# Patient Record
Sex: Female | Born: 1935 | Race: White | Hispanic: No | Marital: Married | Smoking: Former smoker
Health system: Southern US, Community
[De-identification: ages and names within clinical notes are randomized; demographics above are authoritative.]

## PROBLEM LIST (undated history)

## (undated) DIAGNOSIS — F319 Bipolar disorder, unspecified: Secondary | ICD-10-CM

## (undated) DIAGNOSIS — E7800 Pure hypercholesterolemia, unspecified: Secondary | ICD-10-CM

## (undated) DIAGNOSIS — D649 Anemia, unspecified: Secondary | ICD-10-CM

## (undated) DIAGNOSIS — M1990 Unspecified osteoarthritis, unspecified site: Secondary | ICD-10-CM

## (undated) DIAGNOSIS — R945 Abnormal results of liver function studies: Secondary | ICD-10-CM

## (undated) DIAGNOSIS — Z8673 Personal history of transient ischemic attack (TIA), and cerebral infarction without residual deficits: Secondary | ICD-10-CM

## (undated) DIAGNOSIS — K219 Gastro-esophageal reflux disease without esophagitis: Secondary | ICD-10-CM

## (undated) DIAGNOSIS — K5900 Constipation, unspecified: Secondary | ICD-10-CM

## (undated) DIAGNOSIS — E079 Disorder of thyroid, unspecified: Secondary | ICD-10-CM

## (undated) DIAGNOSIS — N189 Chronic kidney disease, unspecified: Secondary | ICD-10-CM

## (undated) DIAGNOSIS — H3530 Unspecified macular degeneration: Secondary | ICD-10-CM

## (undated) DIAGNOSIS — G5710 Meralgia paresthetica, unspecified lower limb: Secondary | ICD-10-CM

## (undated) DIAGNOSIS — T8859XA Other complications of anesthesia, initial encounter: Secondary | ICD-10-CM

## (undated) DIAGNOSIS — R569 Unspecified convulsions: Secondary | ICD-10-CM

## (undated) DIAGNOSIS — I10 Essential (primary) hypertension: Secondary | ICD-10-CM

## (undated) DIAGNOSIS — B029 Zoster without complications: Secondary | ICD-10-CM

## (undated) DIAGNOSIS — G5600 Carpal tunnel syndrome, unspecified upper limb: Secondary | ICD-10-CM

## (undated) DIAGNOSIS — N390 Urinary tract infection, site not specified: Secondary | ICD-10-CM

## (undated) DIAGNOSIS — T4145XA Adverse effect of unspecified anesthetic, initial encounter: Secondary | ICD-10-CM

## (undated) DIAGNOSIS — J8410 Pulmonary fibrosis, unspecified: Secondary | ICD-10-CM

## (undated) DIAGNOSIS — K5790 Diverticulosis of intestine, part unspecified, without perforation or abscess without bleeding: Secondary | ICD-10-CM

## (undated) DIAGNOSIS — E119 Type 2 diabetes mellitus without complications: Secondary | ICD-10-CM

## (undated) DIAGNOSIS — D631 Anemia in chronic kidney disease: Secondary | ICD-10-CM

## (undated) DIAGNOSIS — R7989 Other specified abnormal findings of blood chemistry: Secondary | ICD-10-CM

## (undated) DIAGNOSIS — N184 Chronic kidney disease, stage 4 (severe): Secondary | ICD-10-CM

## (undated) DIAGNOSIS — I509 Heart failure, unspecified: Secondary | ICD-10-CM

## (undated) HISTORY — PX: BACK SURGERY: SHX140

## (undated) HISTORY — DX: Type 2 diabetes mellitus without complications: E11.9

## (undated) HISTORY — PX: EYE SURGERY: SHX253

## (undated) HISTORY — PX: COLONOSCOPY: SHX174

## (undated) HISTORY — PX: TONSILLECTOMY: SUR1361

## (undated) HISTORY — PX: BREAST BIOPSY: SHX20

## (undated) HISTORY — PX: CATARACT EXTRACTION, BILATERAL: SHX1313

## (undated) HISTORY — PX: CARPAL TUNNEL RELEASE: SHX101

---

## 1999-04-30 ENCOUNTER — Encounter: Admission: RE | Admit: 1999-04-30 | Discharge: 1999-07-29 | Payer: Self-pay | Admitting: *Deleted

## 1999-08-30 ENCOUNTER — Encounter: Payer: Self-pay | Admitting: Gastroenterology

## 1999-08-30 ENCOUNTER — Encounter: Admission: RE | Admit: 1999-08-30 | Discharge: 1999-08-30 | Payer: Self-pay | Admitting: Gastroenterology

## 1999-09-02 ENCOUNTER — Other Ambulatory Visit: Admission: RE | Admit: 1999-09-02 | Discharge: 1999-09-02 | Payer: Self-pay | Admitting: Gastroenterology

## 2000-05-15 ENCOUNTER — Encounter (INDEPENDENT_AMBULATORY_CARE_PROVIDER_SITE_OTHER): Payer: Self-pay | Admitting: Specialist

## 2000-05-15 ENCOUNTER — Ambulatory Visit (HOSPITAL_COMMUNITY): Admission: RE | Admit: 2000-05-15 | Discharge: 2000-05-15 | Payer: Self-pay | Admitting: Gastroenterology

## 2000-06-05 ENCOUNTER — Ambulatory Visit (HOSPITAL_COMMUNITY): Admission: RE | Admit: 2000-06-05 | Discharge: 2000-06-05 | Payer: Self-pay | Admitting: *Deleted

## 2000-06-05 ENCOUNTER — Encounter: Payer: Self-pay | Admitting: *Deleted

## 2000-08-31 ENCOUNTER — Encounter: Payer: Self-pay | Admitting: Gastroenterology

## 2000-08-31 ENCOUNTER — Encounter: Admission: RE | Admit: 2000-08-31 | Discharge: 2000-08-31 | Payer: Self-pay | Admitting: Gastroenterology

## 2000-09-18 ENCOUNTER — Other Ambulatory Visit: Admission: RE | Admit: 2000-09-18 | Discharge: 2000-09-18 | Payer: Self-pay | Admitting: Gastroenterology

## 2001-03-12 ENCOUNTER — Encounter: Payer: Self-pay | Admitting: Gastroenterology

## 2001-03-12 ENCOUNTER — Encounter: Admission: RE | Admit: 2001-03-12 | Discharge: 2001-03-12 | Payer: Self-pay | Admitting: Gastroenterology

## 2001-03-26 ENCOUNTER — Encounter: Payer: Self-pay | Admitting: Neurological Surgery

## 2001-03-30 ENCOUNTER — Encounter: Payer: Self-pay | Admitting: Neurological Surgery

## 2001-03-30 ENCOUNTER — Inpatient Hospital Stay (HOSPITAL_COMMUNITY): Admission: RE | Admit: 2001-03-30 | Discharge: 2001-03-31 | Payer: Self-pay | Admitting: Neurological Surgery

## 2001-06-02 ENCOUNTER — Encounter: Payer: Self-pay | Admitting: Neurological Surgery

## 2001-06-02 ENCOUNTER — Encounter: Admission: RE | Admit: 2001-06-02 | Discharge: 2001-06-02 | Payer: Self-pay | Admitting: Neurological Surgery

## 2001-09-22 ENCOUNTER — Encounter: Admission: RE | Admit: 2001-09-22 | Discharge: 2001-09-22 | Payer: Self-pay | Admitting: Gastroenterology

## 2001-09-22 ENCOUNTER — Encounter: Payer: Self-pay | Admitting: Gastroenterology

## 2001-09-23 ENCOUNTER — Other Ambulatory Visit: Admission: RE | Admit: 2001-09-23 | Discharge: 2001-09-23 | Payer: Self-pay | Admitting: Gastroenterology

## 2001-09-27 ENCOUNTER — Encounter: Payer: Self-pay | Admitting: Gastroenterology

## 2001-09-27 ENCOUNTER — Encounter: Admission: RE | Admit: 2001-09-27 | Discharge: 2001-09-27 | Payer: Self-pay | Admitting: Gastroenterology

## 2001-09-29 ENCOUNTER — Encounter: Admission: RE | Admit: 2001-09-29 | Discharge: 2001-10-27 | Payer: Self-pay | Admitting: Gastroenterology

## 2002-05-16 ENCOUNTER — Encounter: Admission: RE | Admit: 2002-05-16 | Discharge: 2002-05-16 | Payer: Self-pay | Admitting: Neurological Surgery

## 2002-05-16 ENCOUNTER — Encounter: Payer: Self-pay | Admitting: Neurological Surgery

## 2002-09-22 ENCOUNTER — Encounter: Payer: Self-pay | Admitting: Gastroenterology

## 2002-09-22 ENCOUNTER — Encounter: Admission: RE | Admit: 2002-09-22 | Discharge: 2002-09-22 | Payer: Self-pay | Admitting: Gastroenterology

## 2002-09-26 ENCOUNTER — Other Ambulatory Visit: Admission: RE | Admit: 2002-09-26 | Discharge: 2002-09-26 | Payer: Self-pay | Admitting: Gastroenterology

## 2003-05-19 ENCOUNTER — Encounter: Admission: RE | Admit: 2003-05-19 | Discharge: 2003-05-19 | Payer: Self-pay | Admitting: Orthopedic Surgery

## 2003-05-19 ENCOUNTER — Encounter: Payer: Self-pay | Admitting: Orthopedic Surgery

## 2003-07-06 ENCOUNTER — Encounter: Admission: RE | Admit: 2003-07-06 | Discharge: 2003-08-18 | Payer: Self-pay | Admitting: Orthopedic Surgery

## 2003-09-25 ENCOUNTER — Encounter: Admission: RE | Admit: 2003-09-25 | Discharge: 2003-09-25 | Payer: Self-pay | Admitting: Gastroenterology

## 2003-09-29 ENCOUNTER — Other Ambulatory Visit: Admission: RE | Admit: 2003-09-29 | Discharge: 2003-09-29 | Payer: Self-pay | Admitting: Gastroenterology

## 2003-10-24 ENCOUNTER — Encounter: Admission: RE | Admit: 2003-10-24 | Discharge: 2003-10-24 | Payer: Self-pay | Admitting: Gastroenterology

## 2004-10-02 ENCOUNTER — Encounter: Admission: RE | Admit: 2004-10-02 | Discharge: 2004-10-02 | Payer: Self-pay | Admitting: Gastroenterology

## 2005-01-07 ENCOUNTER — Encounter: Admission: RE | Admit: 2005-01-07 | Discharge: 2005-01-07 | Payer: Self-pay | Admitting: Neurological Surgery

## 2005-03-10 ENCOUNTER — Ambulatory Visit: Payer: Self-pay | Admitting: Physical Medicine & Rehabilitation

## 2005-03-10 ENCOUNTER — Inpatient Hospital Stay (HOSPITAL_COMMUNITY): Admission: RE | Admit: 2005-03-10 | Discharge: 2005-03-20 | Payer: Self-pay | Admitting: Neurological Surgery

## 2005-10-07 ENCOUNTER — Encounter: Admission: RE | Admit: 2005-10-07 | Discharge: 2005-10-07 | Payer: Self-pay | Admitting: Gastroenterology

## 2005-10-15 ENCOUNTER — Other Ambulatory Visit: Admission: RE | Admit: 2005-10-15 | Discharge: 2005-10-15 | Payer: Self-pay | Admitting: Gastroenterology

## 2006-01-07 ENCOUNTER — Encounter: Admission: RE | Admit: 2006-01-07 | Discharge: 2006-01-07 | Payer: Self-pay | Admitting: Neurological Surgery

## 2006-10-12 ENCOUNTER — Encounter: Admission: RE | Admit: 2006-10-12 | Discharge: 2006-10-12 | Payer: Self-pay | Admitting: Gastroenterology

## 2007-07-29 ENCOUNTER — Emergency Department (HOSPITAL_COMMUNITY): Admission: EM | Admit: 2007-07-29 | Discharge: 2007-07-29 | Payer: Self-pay | Admitting: Emergency Medicine

## 2007-11-01 ENCOUNTER — Other Ambulatory Visit: Admission: RE | Admit: 2007-11-01 | Discharge: 2007-11-01 | Payer: Self-pay | Admitting: Gastroenterology

## 2007-11-17 ENCOUNTER — Encounter: Admission: RE | Admit: 2007-11-17 | Discharge: 2007-11-17 | Payer: Self-pay | Admitting: Gastroenterology

## 2008-06-26 ENCOUNTER — Encounter: Payer: Self-pay | Admitting: Internal Medicine

## 2008-07-03 ENCOUNTER — Encounter: Payer: Self-pay | Admitting: Internal Medicine

## 2008-07-03 ENCOUNTER — Encounter: Admission: RE | Admit: 2008-07-03 | Discharge: 2008-07-03 | Payer: Self-pay | Admitting: Internal Medicine

## 2008-07-05 DIAGNOSIS — F329 Major depressive disorder, single episode, unspecified: Secondary | ICD-10-CM | POA: Insufficient documentation

## 2008-07-05 DIAGNOSIS — E7800 Pure hypercholesterolemia, unspecified: Secondary | ICD-10-CM | POA: Insufficient documentation

## 2008-07-06 ENCOUNTER — Ambulatory Visit: Payer: Self-pay | Admitting: Internal Medicine

## 2008-07-06 DIAGNOSIS — D631 Anemia in chronic kidney disease: Secondary | ICD-10-CM | POA: Insufficient documentation

## 2008-07-06 DIAGNOSIS — J8410 Pulmonary fibrosis, unspecified: Secondary | ICD-10-CM | POA: Insufficient documentation

## 2008-07-06 DIAGNOSIS — N189 Chronic kidney disease, unspecified: Secondary | ICD-10-CM

## 2008-07-06 DIAGNOSIS — E119 Type 2 diabetes mellitus without complications: Secondary | ICD-10-CM | POA: Insufficient documentation

## 2008-07-07 LAB — CONVERTED CEMR LAB
Basophils Absolute: 0.1 10*3/uL (ref 0.0–0.1)
Eosinophils Absolute: 0.4 10*3/uL (ref 0.0–0.7)
MCHC: 32.9 g/dL (ref 30.0–36.0)
MCV: 84.4 fL (ref 78.0–100.0)
Neutrophils Relative %: 61 % (ref 43.0–77.0)
Platelets: 312 10*3/uL (ref 150–400)
Pro B Natriuretic peptide (BNP): 80 pg/mL (ref 0.0–100.0)
RBC: 3.53 M/uL — ABNORMAL LOW (ref 3.87–5.11)
RDW: 15.5 % — ABNORMAL HIGH (ref 11.5–14.6)

## 2008-10-17 ENCOUNTER — Ambulatory Visit: Payer: Self-pay | Admitting: Internal Medicine

## 2008-11-17 ENCOUNTER — Encounter: Admission: RE | Admit: 2008-11-17 | Discharge: 2008-11-17 | Payer: Self-pay | Admitting: Gastroenterology

## 2009-01-22 ENCOUNTER — Ambulatory Visit: Payer: Self-pay | Admitting: Internal Medicine

## 2009-01-22 ENCOUNTER — Encounter: Payer: Self-pay | Admitting: Internal Medicine

## 2009-05-01 ENCOUNTER — Encounter: Admission: RE | Admit: 2009-05-01 | Discharge: 2009-05-01 | Payer: Self-pay | Admitting: Internal Medicine

## 2009-07-13 ENCOUNTER — Ambulatory Visit: Payer: Self-pay | Admitting: Internal Medicine

## 2009-11-20 ENCOUNTER — Encounter: Admission: RE | Admit: 2009-11-20 | Discharge: 2009-11-20 | Payer: Self-pay | Admitting: Internal Medicine

## 2009-11-29 ENCOUNTER — Ambulatory Visit: Payer: Self-pay | Admitting: Internal Medicine

## 2009-11-29 ENCOUNTER — Inpatient Hospital Stay (HOSPITAL_COMMUNITY): Admission: EM | Admit: 2009-11-29 | Discharge: 2009-12-02 | Payer: Self-pay | Admitting: Emergency Medicine

## 2009-11-30 ENCOUNTER — Encounter (INDEPENDENT_AMBULATORY_CARE_PROVIDER_SITE_OTHER): Payer: Self-pay | Admitting: Neurology

## 2010-04-09 ENCOUNTER — Telehealth (INDEPENDENT_AMBULATORY_CARE_PROVIDER_SITE_OTHER): Payer: Self-pay | Admitting: *Deleted

## 2010-04-17 ENCOUNTER — Ambulatory Visit: Payer: Self-pay | Admitting: Internal Medicine

## 2010-04-23 ENCOUNTER — Ambulatory Visit: Payer: Self-pay | Admitting: Internal Medicine

## 2010-07-03 ENCOUNTER — Other Ambulatory Visit
Admission: RE | Admit: 2010-07-03 | Discharge: 2010-07-03 | Payer: Self-pay | Source: Home / Self Care | Admitting: Internal Medicine

## 2010-09-05 NOTE — Miscellaneous (Signed)
Summary: Orders Update-PFT CHARGES   Clinical Lists Changes  Orders: Added new Service order of Spirometry (Pre & Post) (94060) - Signed Added new Service order of Lung Volumes (94240) - Signed Added new Service order of Carbon Monoxide diffusing w/capacity (94720) - Signed 

## 2010-09-05 NOTE — Assessment & Plan Note (Signed)
Summary: Pulmonary/  ext f/u ov, now desats with ex x 151ft x 3rd lap   Copy to:  Dr. Nehemiah Settle Primary Provider/Referring Provider:  Dr. Johnella Moloney  CC:  Followup ov with cxr.  Pt states doing well.  No complaints today.Marland Kitchen  History of Present Illness: 33 yowf quit smoking 1992 without resp problems then 12/08 left sided cp seen in er resolved on its own. was not a breathing pain. no treatment  July 06, 2008 pulmonary consult at request of dr Nehemiah Settle for Dr Melchor Amour  for ?new pulmonary fibrosis  with cxr done 2 weeks prior to ov when pt presented with sore throat,  dry cough leading to >  ct suggesting pulmonary fibrosis  rx with abx for a week no more st, no sign cough, not bringing up anything, no sob although relatively sedentary.No h/o macrodantin exp, unusual exposure hx, cancer or cancer rx, connective tissue dz or amiodarone use. w/u with esr  61,  rec ppi two times a day pending completion of w/u.  October 17, 2008 ov scheduled for PFT's but did not get them yet due to weather delays,  limited by leg to grocery store walking, denies limiting sob or cough. rec conservative rx vs prednisone trial, she says she took it didn't seem to help anything.  January 22, 2009 ov limited by back and leg pain uses hc parking but able grocery store no cough.  July 13, 2009 6 month followup.  Pt denies any complaints today.  States that her breathing has been fine but not very active.   Has seen rheumatologist Nickola Major) does not feel she has RA per pt but all gout  April 17, 2010 ov with cxr.  Denies doe,  able to work all day in garden  but not aerobics per se. no cough.  Pt denies any significant sore throat, dysphagia, itching, sneezing,  nasal congestion or excess secretions,  fever, chills, sweats, unintended wt loss, pleuritic or exertional cp, hempoptysis, change in activity tolerance  orthopnea pnd or leg swelling.    Current Medications (verified): 1)  Vision Formula  Tabs (Multiple  Vitamins-Minerals) .Marland Kitchen.. 1 Once Daily 2)  Metformin Hcl 500 Mg Tabs (Metformin Hcl) .... 2 Two Times A Day 3)  Zetia 10 Mg Tabs (Ezetimibe) .Marland Kitchen.. 1 Every Am 4)  Prevacid 24hr 15 Mg Cpdr (Lansoprazole) .Marland Kitchen.. 1 Once Daily 30-60 Min Before 1st Meal 5)  Glipizide 10 Mg Tabs (Glipizide) .Marland Kitchen.. 1 Once Daily 6)  Atenolol 25 Mg Tabs (Atenolol) .Marland Kitchen.. 1 Two Times A Day 7)  Aspirin 325 Mg Tabs (Aspirin) .Marland Kitchen.. 1 Once Daily 8)  Impruv  Lotn (Emollient) .... Once Daily 9)  Primidone 50 Mg Tabs (Primidone) .Marland Kitchen.. 1 Two Times A Day 10)  Ferrous Fumarate 325 (106 Fe) Mg Tabs (Ferrous Fumarate) .Marland Kitchen.. 1 Once Daily 11)  Ocuvite  Tabs (Multiple Vitamins-Minerals) .Marland Kitchen.. 1 Once Daily 12)  Lutein 6 Mg Caps (Lutein) .Marland Kitchen.. 1 Once Daily 13)  Amlodipine Besylate 5 Mg Tabs (Amlodipine Besylate) .Marland Kitchen.. 1 Once Daily 14)  Allopurinol 100 Mg Tabs (Allopurinol) .Marland Kitchen.. 1 Two Times A Day 15)  Vitamin D3 1000 Unit Caps (Cholecalciferol) .Marland Kitchen.. 1 Once Daily 16)  B-12 Injections .Marland Kitchen.. 1 Every Month  Allergies (verified): 1)  ! Sulfa 2)  Pcn  Past History:  Past Medical History: DEPRESSION (ICD-311) HYPERCHOLESTEROLEMIA (ICD-272.0) Diabetes, Type 2 PULMONARY FIBROSIS onset ? 2008 (cxr nl from 8/06)..................................Marland KitchenWert    - PFT's 01/22/09  FEV1 1.24 (73%) ratio 80,  no better  after B2,  DLC0 23 % corrects to 69%    - Mild desat p 3 laps @ 185 each April 17, 2010   Vital Signs:  Patient profile:   76 year old female Weight:      148 pounds BMI:     27.17 O2 Sat:      95 % on Room air Temp:     98.2 degrees F oral Pulse rate:   57 / minute BP sitting:   144 / 80  (left arm)  Vitals Entered By: Vernie Murders (April 17, 2010 12:22 PM)  O2 Flow:  Room air  Serial Vital Signs/Assessments:  Comments: 12:23 PM Ambulatory Pulse Oximetry  Resting; HR__59___    02 Sat__97%ra___  Lap1 (185 feet)   HR__66___   02 Sat__90%ra___ Lap2 (185 feet)   HR__80___   02 Sat__90%ra___    Lap3 (185 feet)   HR__90___    02 Sat__93%ra___  _x__Test Completed without Difficulty- note that halfway through the 3rd lap sat dropped to 87%ra but then came back up to 93%ra ___Test Stopped due to:   By: Vernie Murders    Physical Exam  Additional Exam:  wt 162  July 06, 2008 > 162 October 17, 2008 > 153  January 22, 2009 > 146 July 13, 2009 > 148 April 17, 2010  Ambulatory healthy appearing in no acute distress. Afeb with normal vital signs HEENT:edentulous with nl turbinates, and orophanx.  Neck without JVD/Nodes/TM Lungs trace crackles in bases without cough on insp or exp maneuvers RRR no s3 or murmur or increase in P2 Abd soft and benign with nl excursion in the supine position. No bruits or organomegaly Ext warm without calf tenderness, cyanosis clubbing or edema    CXR  Procedure date:  04/17/2010  Findings:      Unchanged findings of pulmonary fibrosis involving the left greater than right lung base.    Impression & Recommendations:  Problem # 1:  PULMONARY FIBROSIS ILD POST INFLAMMATORY CHRONIC (ICD-515)  DDx for pulmonary fibrosis with honeycombing includes idiopathic pulmonary fibrosis, pulmonary fibrosis associated with rheumatologic disease, adverse effect from  drugs such as chemotherapy or amiodarone exposure, nonspecific interstitial pneumonia which is typically steroid responsive, and chronic hypersensitivity pneumonitis in this setting.   For now does not appear limited by dyspnea and had no apparent response to streroid trial previously so will follow conservatively for now - note if this is collagen vasc dz related fibrosis the prognosis is good vs IPF but should repeat pft's yearly x 3 years (optimally) to document stability and lack of progression.  Will continue to prevent GERD since assoc with PF (though no cause and effect established)     Medications Added to Medication List This Visit: 1)  Glipizide 10 Mg Tabs (Glipizide) .Marland Kitchen.. 1 once daily 2)  Aspirin 325 Mg Tabs  (Aspirin) .Marland Kitchen.. 1 once daily 3)  Ferrous Fumarate 325 (106 Fe) Mg Tabs (Ferrous fumarate) .Marland Kitchen.. 1 once daily 4)  Ocuvite Tabs (Multiple vitamins-minerals) .Marland Kitchen.. 1 once daily 5)  Lutein 6 Mg Caps (Lutein) .Marland Kitchen.. 1 once daily 6)  Amlodipine Besylate 5 Mg Tabs (Amlodipine besylate) .Marland Kitchen.. 1 once daily 7)  Allopurinol 100 Mg Tabs (Allopurinol) .Marland Kitchen.. 1 two times a day 8)  Vitamin D3 1000 Unit Caps (Cholecalciferol) .Marland Kitchen.. 1 once daily 9)  B-12 Injections  .Marland Kitchen.. 1 every month  Other Orders: T-2 View CXR (71020TC) Est. Patient Level IV (99214) Pulse Oximetry, Ambulatory (16109)  Patient Instructions: 1)  You have  very pulmonary fibrosis (lung scarring ) which is typically associated with rheumatologic disorders but can be related to medications (macrodantin,  amiodarone) which need to be avoided. 2)  If you start to notice that your breathing starts to bother you  return here.  3)  We need to get you to do PFT's now and yearly

## 2010-09-05 NOTE — Progress Notes (Signed)
Summary: overdue for appt- rov sched for 04/17/10  ---- Converted from flag ---- ---- 04/08/2010 12:58 AM, Nyoka Cowden MD wrote: overdue for f/u cxr and walking sats ------------------------------  Spoke with pt and sched her to see MW for 04/17/10 at 11:30 am.

## 2010-10-22 LAB — POCT CARDIAC MARKERS: Myoglobin, poc: 83.2 ng/mL (ref 12–200)

## 2010-10-22 LAB — CARDIAC PANEL(CRET KIN+CKTOT+MB+TROPI)
CK, MB: 1.1 ng/mL (ref 0.3–4.0)
Relative Index: INVALID (ref 0.0–2.5)
Total CK: 38 U/L (ref 7–177)
Troponin I: 0.01 ng/mL (ref 0.00–0.06)

## 2010-10-22 LAB — CBC
HCT: 35.1 % — ABNORMAL LOW (ref 36.0–46.0)
HCT: 37.3 % (ref 36.0–46.0)
Hemoglobin: 11.8 g/dL — ABNORMAL LOW (ref 12.0–15.0)
Hemoglobin: 12.3 g/dL (ref 12.0–15.0)
MCHC: 32.9 g/dL (ref 30.0–36.0)
MCV: 92.2 fL (ref 78.0–100.0)
Platelets: 263 10*3/uL (ref 150–400)
RBC: 3.82 MIL/uL — ABNORMAL LOW (ref 3.87–5.11)
RBC: 4.04 MIL/uL (ref 3.87–5.11)
RDW: 15.9 % — ABNORMAL HIGH (ref 11.5–15.5)
WBC: 10.8 10*3/uL — ABNORMAL HIGH (ref 4.0–10.5)
WBC: 9.4 10*3/uL (ref 4.0–10.5)

## 2010-10-22 LAB — GLUCOSE, CAPILLARY
Glucose-Capillary: 136 mg/dL — ABNORMAL HIGH (ref 70–99)
Glucose-Capillary: 164 mg/dL — ABNORMAL HIGH (ref 70–99)
Glucose-Capillary: 107 mg/dL — ABNORMAL HIGH (ref 70–99)
Glucose-Capillary: 118 mg/dL — ABNORMAL HIGH (ref 70–99)
Glucose-Capillary: 151 mg/dL — ABNORMAL HIGH (ref 70–99)
Glucose-Capillary: 154 mg/dL — ABNORMAL HIGH (ref 70–99)
Glucose-Capillary: 156 mg/dL — ABNORMAL HIGH (ref 70–99)
Glucose-Capillary: 183 mg/dL — ABNORMAL HIGH (ref 70–99)
Glucose-Capillary: 186 mg/dL — ABNORMAL HIGH (ref 70–99)
Glucose-Capillary: 206 mg/dL — ABNORMAL HIGH (ref 70–99)
Glucose-Capillary: 240 mg/dL — ABNORMAL HIGH (ref 70–99)

## 2010-10-22 LAB — CK TOTAL AND CKMB (NOT AT ARMC)
Relative Index: INVALID (ref 0.0–2.5)
Total CK: 33 U/L (ref 7–177)
Total CK: 35 U/L (ref 7–177)

## 2010-10-22 LAB — COMPREHENSIVE METABOLIC PANEL WITH GFR
ALT: 29 U/L (ref 0–35)
AST: 18 U/L (ref 0–37)
Albumin: 3.5 g/dL (ref 3.5–5.2)
Alkaline Phosphatase: 65 U/L (ref 39–117)
BUN: 25 mg/dL — ABNORMAL HIGH (ref 6–23)
CO2: 27 meq/L (ref 19–32)
Calcium: 9.1 mg/dL (ref 8.4–10.5)
Chloride: 106 meq/L (ref 96–112)
Creatinine, Ser: 1.03 mg/dL (ref 0.4–1.2)
GFR calc non Af Amer: 52 mL/min — ABNORMAL LOW
Glucose, Bld: 121 mg/dL — ABNORMAL HIGH (ref 70–99)
Potassium: 4.4 meq/L (ref 3.5–5.1)
Sodium: 139 meq/L (ref 135–145)
Total Bilirubin: 0.5 mg/dL (ref 0.3–1.2)
Total Protein: 6.9 g/dL (ref 6.0–8.3)

## 2010-10-22 LAB — BASIC METABOLIC PANEL
Calcium: 8.8 mg/dL (ref 8.4–10.5)
GFR calc Af Amer: >60 mL/min (ref >60)
GFR calc non Af Amer: 56 mL/min — ABNORMAL LOW (ref >60)
Glucose, Bld: 129 mg/dL — ABNORMAL HIGH (ref 70–99)
Potassium: 4 mEq/L (ref 3.5–5.1)
Sodium: 137 mEq/L (ref 135–145)

## 2010-10-22 LAB — LIPID PANEL
HDL: 37 mg/dL — ABNORMAL LOW (ref >39)
Total CHOL/HDL Ratio: 5.9 RATIO
Triglycerides: 143 mg/dL (ref <150)

## 2010-10-22 LAB — APTT: aPTT: 25 seconds (ref 24–37)

## 2010-10-22 LAB — GLUCOSE, RANDOM: Glucose, Bld: 176 mg/dL — ABNORMAL HIGH (ref 70–99)

## 2010-10-22 LAB — DIFFERENTIAL
Lymphocytes Relative: 20 % (ref 12–46)
Lymphs Abs: 2.2 10*3/uL (ref 0.7–4.0)
Monocytes Relative: 6 % (ref 3–12)
Neutro Abs: 7.4 10*3/uL (ref 1.7–7.7)
Neutrophils Relative %: 69 % (ref 43–77)

## 2010-10-22 LAB — URINE CULTURE: Colony Count: NO GROWTH

## 2010-10-22 LAB — URINALYSIS, ROUTINE W REFLEX MICROSCOPIC
Bilirubin Urine: NEGATIVE
Nitrite: NEGATIVE
Protein, ur: 100 mg/dL — AB
Specific Gravity, Urine: 1.012 (ref 1.005–1.030)
Urobilinogen, UA: 0.2 mg/dL (ref 0.0–1.0)

## 2010-10-22 LAB — PROTIME-INR: INR: 1.03 (ref 0.00–1.49)

## 2010-10-22 LAB — HEMOGLOBIN A1C
Hgb A1c MFr Bld: 6.5 % — ABNORMAL HIGH (ref <5.7)
Mean Plasma Glucose: 143 mg/dL — ABNORMAL HIGH (ref <117)

## 2010-10-22 LAB — TROPONIN I: Troponin I: 0.01 ng/mL (ref 0.00–0.06)

## 2010-10-22 LAB — VITAMIN B12: Vitamin B-12: 564 pg/mL (ref 211–911)

## 2010-10-22 LAB — URINE MICROSCOPIC-ADD ON

## 2010-12-20 NOTE — Op Note (Signed)
NAMEMarland Kitchen  Susan, Mccoy NO.:  [id number]   MEDICAL RECORD NO.:  [id number]          PATIENT TYPE:  INP   LOCATION:  3028                         FACILITY:  [facility]   PHYSICIAN:  Stefani Dama, M.D.  DATE OF BIRTH:  1932-01-06   DATE OF PROCEDURE:  03/10/2005  DATE OF DISCHARGE:                                 OPERATIVE REPORT   PREOPERATIVE DIAGNOSIS:  Spondylosis L4-L5 with right lumbar radiculopathy.   POSTOPERATIVE DIAGNOSIS:  Spondylosis L4-L5 with right lumbar radiculopathy.   PROCEDURE:  1.  L4-L5 transforaminal diskectomy and decompression of spondylosis.  2.  Posterior interbody arthrodesis via TLIF approach with PEAK bone spacer.  3.  Segmental fixation of L4-L5 with pedicle screws.  4.  Arthrodesis with local autograft and Infuse.   SURGEON:  Stefani Dama, M.D.   FIRST ASSISTANT:  Cristi Loron, M.D.   ANESTHESIA:  General endotracheal.   INDICATIONS:  Ms. Susan Mccoy is a 77 year old individual who has had  significant back pain and right lower extremity pain. She was advised  regarding surgical decompression, having failed efforts at conservative  management.  She was advised surgery would be done via transforaminal route  to decompress the L4-L5 space on the right side, and attempts would be made  to do a stabilization using a Spire plate.  At the time of surgery, however,  it was noted that the patient had a spondylolysis of the L5 laminar arch;  this made it unfeasible to use the [facility] plate.  The patient underwent  pedicle screw fixation at the L4-L5 level.   PROCEDURE:  The patient was brought to the operating room supine on the  stretcher. After smooth induction of general endotracheal anesthesia, she  was turned prone.  The back was prepped with DuraPrep and draped in a  sterile fashion.  A midline incision was created and carried down to  lumbodorsal fascia.  The spinous process of L4 was identified positively  with a  radiograph, and the L4-L5 interlaminar space was clear. There was  noted be significant looseness of the L5 laminar arch, and it was at this  point that the spondylolysis was noted. The dissection was then carried out  over the interlaminar space at L4-L5,  and a laminotomy of L4 was created on  the right side.  Scar tissue in this area was taken down and care was taken  to protect the common dural tube and the takeoff of the L5 nerve root.  The  foramen of the L5 nerve root was noted to be significantly narrowed,  secondary to spondylitic overgrowth from the spondylolysis.  This was taken  down and the foramen was opened. There was noted to be a marked protrusion  from underlying osteophyte at the L4-L5 interspace. This was then taken down  with osteophytectomy tool and some small rongeurs. The disk space was then  entered; it was noted to be severely collapsed.  It could be gradually  mobilized using a combination of Kerrison rongeurs and eventually diskectomy  of the remaining disk material was performed.  The disk space was opened  so  that ultimately an 8 mm disk preparation tool could be inserted into this  area; and the endplates were rongeured clear.  Once the endplates were  cleared, it was noted be good cortical bone and apposition.  At this point a  trial of an 8 mm TLIF  spacer was obtained, and it was felt that this fitted  nicely into the interspace. The TLIF spacer was then packed with the  patient's own bone in combination with a Infuse bone arthrodesis stimulant.  The interspace was then packed with some of the patient's own bone obtained  from the laminotomy, and then the cage was inserted.  Once this was  completed, then pedicle entry sites were chosen at L4 and L5. This was done  with fluoroscopic guidance, and when adequate pedicle entry sites were  chosen and they were tapped with 5.5 mm taps, then 6.5 x 45 mm pedicle  screws were inserted into L4 and L5.  Their  positions were checked  radiographically. The screws were tightened together with a short rod. The  rod was tightened into final position. Final localizing radiograph  identified good position of the surgical construct.  Posterior arthrodesis  was undertaken on the left side, where the facet joint had been partially  removed and the bone along the laminar arch of L5 and L4 were decorticated.  Infuse was also placed in this interval.  Once this was completed,  retractors were removed.  The wound was checked for hemostasis and then the  lumbodorsal fascia was closed with #1 Vicryl interrupted fashion; 2-0 Vicryl  was used in the subcutaneous and subcuticular tissues. Dermabond was placed  on the skin. The patient tolerated the procedure well. Blood loss was  estimated at 250 cc.       HJE/MEDQ  D:  03/10/2005  T:  03/11/2005  Job:  [id number]

## 2010-12-20 NOTE — H&P (Signed)
NAMEMarland Mccoy  [name], GOEL NO.:  [id number]   MEDICAL RECORD NO.:  [id number]          PATIENT TYPE:  INP   LOCATION:  3028                         FACILITY:  [facility]   PHYSICIAN:  Stefani Dama, M.D.  DATE OF BIRTH:  September 04, 1931   DATE OF ADMISSION:  03/10/2005  DATE OF DISCHARGE:                                HISTORY & PHYSICAL   ADMISSION DIAGNOSES:  1.  Lumbar spondylosis.  2.  Lumbar radiculopathy.   HISTORY OF PRESENT ILLNESS:  Ms. Susan Mccoy is a 77 year old right-handed  individual who had been seen and followed by me since 2003. She had evidence  of degenerative changes at L4, L5. She has had a previous diskectomy by me  also in 2002 at L4, L5 on the right side. She seemed to do reasonably well,  however the patient had evidence of degenerative changes in the lower lumbar  spine without any overt nerve root compromise and ultimately the patient was  seen and treated conservatively and has undergone epidural steroid  injections by Dr. Sheran Luz. She has had continued problems with pain in  the buttock and right lower extremity to the point where she knows that she  cannot walk any distance. She finds that in going to the store, such as [facility] is nearly impossible and on several occasions she has gotten as far as  the front door and ultimately turned around. She furthermore notes that she  has not been able to do simple activities such as gardening or digging in  her yard and this has been very troubling for her. Generally she finds that  she can sit with some relief but standing for any length of time or walking  any distance aggravates pain in the region of the right hip and right lower  extremity. She notes symptoms referable to the foot in terms of numbness and  pain itself. Most of the pain in and of itself is in the leg.   PAST MEDICAL HISTORY:  1.  She has had generally good health.  2.  She notes that she has been falling and bumping around a  lot. She does      use aspirin on a regular basis and has some superficial ecchymoses      secondary to this.  3.  She has been on some Neurontin to help control pain, prescribed by Dr.      Sheran Luz.  4.  She further more tells me that she has had a chronic history of anemia.   REVIEW OF SYSTEMS:  Notable for easy bruising, friable skin. History of  anemia.   PHYSICAL EXAMINATION:  NEUROLOGIC EXAM:  Notes that she has good strength in  the iliopsoas, quadriceps, tibialis anterior and gastroc's in the lower  extremities. Reflexes are 3+ in both patella and 3+ in both Achilles.  Babinski's are down going. Auscultation and percussion across her back does  not reproduce any of her pain though there is some tenderness to deep  palpation over the lumbosacral junction.  Her gait is unimpeded and she is  able to toe  and heel walk with some difficulty. Her station is normal. She  does tend to favor a 5 degree forward stoop.   Upper extremity strength and reflexes are also within the limits of normal  with 2+ reflexes in the biceps and triceps.   Cranial nerve examination reveals her pupils are 4 mm, brisk, react to light  and accommodation. Extraocular movements are full. Face is symmetric to  grimace. Tongue and uvula are in midline. Sclerae and conjunctivae are  clear.   GENERAL PHYSICAL EXAMINATION:  HEENT EXAM:  Normal. No masses are palpated  in the neck, no bruits are heard.  LUNGS:  Clear to auscultation.  HEART:  Regular rate and rhythm, no murmurs are heard.  ABDOMEN:  Soft, protuberant, bowel sounds are positive, no masses are  palpable.  EXTREMITIES:  Reveal no cyanosis, clubbing or edema.   IMPRESSION:  The patient has evidence of significant spondylitic disease in  the lower lumbar spine. She is now being admitted to undergo surgical  decompression of L4, L5 on the right side after an MRI this past June  demonstrated that she had progression of spondylitic changes with  right  sided foraminal stenosis.   PLAN:  She is now being admitted to undergo surgical decompression followed  by stabilization of the L4, L5 joint.       HJE/MEDQ  D:  03/10/2005  T:  03/11/2005  Job:  [id number]

## 2010-12-20 NOTE — Discharge Summary (Signed)
NAMEMarland Mccoy  CICILIA, CLINGER NO.:  [id number]   MEDICAL RECORD NO.:  [id number]          PATIENT TYPE:  INP   LOCATION:  3016                         FACILITY:  [facility]   PHYSICIAN:  Payton Doughty, M.D.      DATE OF BIRTH:  06/03/1932   DATE OF ADMISSION:  03/10/2005  DATE OF DISCHARGE:  03/20/2005                                 DISCHARGE SUMMARY   ADMITTING DIAGNOSES:  Lumbar spondylosis, lumbar radiculopathy.   DISCHARGE DIAGNOSES:  Lumbar spondylosis, lumbar radiculopathy, occasional  delirium.   PROCEDURE:  Decompression of L4-5 with L4-5 fusion with T-lift nonsegmental  fixation.   COMPLICATIONS:  None.   This is a very nice 77 year old right-handed white lady whose history and  physical is recounted in the chart.  She has had a decompression in the  past.  Has had more difficulty at L4-5 with instability and is admitted for  a fusion.  Medical history is remarkable for adult-onset diabetes and  hypertension.  She was admitted after ascertaining normal laboratory values  and underwent fusion.  Postoperatively she did reasonably well.  However,  over a course of several days became confused and disoriented.  She was seen  by her general medical doctor who did not really have any real helpful  suggestions.  At that time it was felt because of the confusion it would be  wise to keep a close eye on her.  She was transferred back to the ICU.  Her  medications were severely curtailed.  She was taken off of Valium and over  the course of about four days her sensorium cleared to where she is now  awake, alert, and oriented.  Her cranial nerves are intact and she is  walking in physical therapy, eating, and voiding normally.  She is now being  discharged home to the care of her family.  Her follow-up will be in the  [facility] office with Dr. Danielle Dess.  She is being  discharged on Percocet for pain.           ______________________________  Payton Doughty, M.D.     MWR/MEDQ  D:  03/20/2005  T:  03/20/2005  Job:  [id number]

## 2010-12-20 NOTE — Op Note (Signed)
[place]  Patient:    Susan Mccoy, PAT Visit Number: [id number] MRN: [id number]          Service Type: SUR Location: 3000 3030 01 Attending Physician:  Jonne Ply Proc. Date: 03/30/01 Adm. Date:  03/30/2001                             Operative Report  PREOPERATIVE DIAGNOSIS:  Herniated nucleus pulposus, L5-4 right with right lumbar radiculopathy.  POSTOPERATIVE DIAGNOSIS:  Herniated nucleus pulposus, L5-4 right with right lumbar radiculopathy.  OPERATION PERFORMED:  Right microendoscopic diskectomy L4-5 with Metrex microdissection technique and operating microscope.  SURGEON:  Stefani Dama, M.D.  FIRST ASSISTANT:  Dr. Lovell Sheehan.  ANESTHESIA:  General endotracheal.  INDICATIONS FOR PROCEDURE:  The patient is a 77 year old individual who has had significant back and right lower extremity pain.  She has a herniated nucleus pulposus at L4-5 on the right and she is advised regarding surgical intervention.  DESCRIPTION OF PROCEDURE:  The patient was brought to the operating room supine on a stretcher.  After smooth induction of general endotracheal anesthesia, she was turned prone.  The back was shaved and prepped with DuraPrep and draped in a sterile fashion.  A paramedian region at L4-5 was localized using fluoroscopy.  A K-wire was passed to the laminar arch of L4 on the right side.  Then using a wanding technique, a series of dilators was passed over the K-wires to dilate and dissect this area down to the interlaminar space at L4-5 to the 18 mm diameter.  An 18 mm by 5 cm endoscopic canal was placed into the incision at this area and secured to the operating table.  The microscope was draped and brought into the field and the soft tissues overlying the laminar arch of L4 and the interlaminar space were cleared with the monopolar cautery set at 30.  After removing the soft tissue, a laminotomy was created by removing the  inferior margin of the lamina of L4 out to the mesial wall of the facet.  The yellow ligament was taken up in this area and the common dural tube was exposed and explored and the lateral aspect of it was then dissected free.  The take off of the L5 nerve root was noted to be bowed laterally and dorsally.  Gently dissecting the veins, cauterizing and dividing them using microdissection technique, the nerve root could gradually be mobilized off a mass just underneath it right at its shoulder.  The mass was incised with a 15 blade and several fragments of disk were removed from this mass.  The underportion of the opened posterior longitudinal ligament was found to connect with the disk space and this was opened using a 2 and a 3 mm Kerrison punch to expose the disk space.  The disk space was noted to be chock full of a large quantity of markedly degenerated disk material which was removed in a piecemeal fashion using a combination of curets and rongeurs. The disk space was evacuated as best possible to remove any loose fragments of disk from within the space.  Once this was achieved, hemostasis in the soft tissues was doubly checked with a bipolar cautery.  Some small pledgets of Gelfoam were placed under the nerve root to maintain hemostasis in the epidural veins in this area which were not easily reached for cautery.  The Gelfoam was then irrigated away and the path  of the L5 nerve root was checked about the lateral recess.  This was found to be free and clear.  The microscope was then removed and endoscopic cannulas were removed and the subcutaneous tissue was closed with 3-0 Vicryl in interrupted fashion.  The subcuticular tissue was closed similarly.  Hemostasis for the procedure was estimated at 100 cc blood loss.  The patient was transferred to the recovery room in stable condition. Attending Physician:  Jonne Ply DD:  03/30/01 TD:  03/30/01 Job: [id number]

## 2010-12-23 ENCOUNTER — Ambulatory Visit: Payer: Medicare Other | Attending: Internal Medicine | Admitting: Physical Therapy

## 2010-12-23 DIAGNOSIS — IMO0001 Reserved for inherently not codable concepts without codable children: Secondary | ICD-10-CM | POA: Insufficient documentation

## 2010-12-23 DIAGNOSIS — R269 Unspecified abnormalities of gait and mobility: Secondary | ICD-10-CM | POA: Insufficient documentation

## 2010-12-23 DIAGNOSIS — H8110 Benign paroxysmal vertigo, unspecified ear: Secondary | ICD-10-CM | POA: Insufficient documentation

## 2010-12-26 ENCOUNTER — Ambulatory Visit: Payer: Medicare Other | Admitting: Physical Therapy

## 2010-12-31 ENCOUNTER — Ambulatory Visit: Payer: Medicare Other | Admitting: Physical Therapy

## 2011-01-03 ENCOUNTER — Ambulatory Visit: Payer: Medicare Other | Attending: Internal Medicine | Admitting: Physical Therapy

## 2011-01-03 DIAGNOSIS — R269 Unspecified abnormalities of gait and mobility: Secondary | ICD-10-CM | POA: Insufficient documentation

## 2011-01-03 DIAGNOSIS — H8110 Benign paroxysmal vertigo, unspecified ear: Secondary | ICD-10-CM | POA: Insufficient documentation

## 2011-01-03 DIAGNOSIS — IMO0001 Reserved for inherently not codable concepts without codable children: Secondary | ICD-10-CM | POA: Insufficient documentation

## 2011-01-07 ENCOUNTER — Ambulatory Visit: Payer: Medicare Other | Admitting: Physical Therapy

## 2011-01-08 ENCOUNTER — Ambulatory Visit: Payer: Medicare Other | Admitting: Physical Therapy

## 2011-01-15 ENCOUNTER — Ambulatory Visit: Payer: Medicare Other | Admitting: Physical Therapy

## 2011-01-16 ENCOUNTER — Ambulatory Visit: Payer: Medicare Other | Admitting: Physical Therapy

## 2011-01-22 ENCOUNTER — Ambulatory Visit: Payer: Medicare Other | Admitting: Physical Therapy

## 2011-02-06 ENCOUNTER — Ambulatory Visit: Payer: Medicare Other | Attending: Internal Medicine | Admitting: Physical Therapy

## 2011-02-06 DIAGNOSIS — H8110 Benign paroxysmal vertigo, unspecified ear: Secondary | ICD-10-CM | POA: Insufficient documentation

## 2011-02-06 DIAGNOSIS — IMO0001 Reserved for inherently not codable concepts without codable children: Secondary | ICD-10-CM | POA: Insufficient documentation

## 2011-02-06 DIAGNOSIS — R269 Unspecified abnormalities of gait and mobility: Secondary | ICD-10-CM | POA: Insufficient documentation

## 2011-05-09 LAB — DIFFERENTIAL
Eosinophils Relative: 4
Lymphocytes Relative: 32
Lymphs Abs: 2.7
Monocytes Relative: 7
Neutrophils Relative %: 56

## 2011-05-09 LAB — I-STAT 8, (EC8 V) (CONVERTED LAB)
BUN: 36 — ABNORMAL HIGH
Chloride: 111
HCT: 35 — ABNORMAL LOW
Hemoglobin: 11.9 — ABNORMAL LOW
Operator id: 285491
Sodium: 138

## 2011-05-09 LAB — POCT CARDIAC MARKERS
Myoglobin, poc: 232
Operator id: 285491
Troponin i, poc: <0.05

## 2011-05-09 LAB — CBC
HCT: 31.4 — ABNORMAL LOW
Platelets: 333
RBC: 3.78 — ABNORMAL LOW
WBC: 8.4

## 2011-05-09 LAB — HEPATIC FUNCTION PANEL
ALT: 31
Albumin: 4
Alkaline Phosphatase: 46
Total Protein: 7.4

## 2011-08-18 ENCOUNTER — Ambulatory Visit: Payer: Medicare Other | Attending: Internal Medicine | Admitting: Physical Therapy

## 2011-08-18 DIAGNOSIS — IMO0001 Reserved for inherently not codable concepts without codable children: Secondary | ICD-10-CM | POA: Insufficient documentation

## 2011-08-18 DIAGNOSIS — R269 Unspecified abnormalities of gait and mobility: Secondary | ICD-10-CM | POA: Insufficient documentation

## 2011-08-18 DIAGNOSIS — H8110 Benign paroxysmal vertigo, unspecified ear: Secondary | ICD-10-CM | POA: Insufficient documentation

## 2011-08-19 ENCOUNTER — Ambulatory Visit: Payer: Medicare Other | Admitting: Physical Therapy

## 2011-08-27 ENCOUNTER — Encounter: Payer: BLUE CROSS/BLUE SHIELD | Admitting: Physical Therapy

## 2011-08-29 ENCOUNTER — Ambulatory Visit: Payer: Medicare Other | Admitting: Physical Therapy

## 2011-09-01 ENCOUNTER — Ambulatory Visit: Payer: Medicare Other | Admitting: Physical Therapy

## 2011-09-04 ENCOUNTER — Ambulatory Visit: Payer: Medicare Other | Admitting: Physical Therapy

## 2011-09-08 ENCOUNTER — Ambulatory Visit: Payer: Medicare Other | Attending: Internal Medicine | Admitting: Physical Therapy

## 2011-09-08 DIAGNOSIS — R269 Unspecified abnormalities of gait and mobility: Secondary | ICD-10-CM | POA: Insufficient documentation

## 2011-09-08 DIAGNOSIS — H8110 Benign paroxysmal vertigo, unspecified ear: Secondary | ICD-10-CM | POA: Insufficient documentation

## 2011-09-08 DIAGNOSIS — IMO0001 Reserved for inherently not codable concepts without codable children: Secondary | ICD-10-CM | POA: Insufficient documentation

## 2011-09-11 ENCOUNTER — Ambulatory Visit: Payer: Medicare Other | Admitting: Physical Therapy

## 2011-09-15 ENCOUNTER — Ambulatory Visit: Payer: Medicare Other | Admitting: Physical Therapy

## 2011-09-18 ENCOUNTER — Ambulatory Visit: Payer: Medicare Other | Admitting: Physical Therapy

## 2011-09-22 ENCOUNTER — Ambulatory Visit: Payer: Medicare Other | Admitting: Physical Therapy

## 2011-09-23 ENCOUNTER — Ambulatory Visit: Payer: Medicare Other | Admitting: Physical Therapy

## 2011-09-29 ENCOUNTER — Ambulatory Visit: Payer: Medicare Other | Admitting: Physical Therapy

## 2011-10-02 ENCOUNTER — Ambulatory Visit: Payer: Medicare Other | Admitting: Physical Therapy

## 2011-10-06 ENCOUNTER — Ambulatory Visit: Payer: Medicare Other | Attending: Internal Medicine | Admitting: Physical Therapy

## 2011-10-06 DIAGNOSIS — IMO0001 Reserved for inherently not codable concepts without codable children: Secondary | ICD-10-CM | POA: Insufficient documentation

## 2011-10-06 DIAGNOSIS — H8110 Benign paroxysmal vertigo, unspecified ear: Secondary | ICD-10-CM | POA: Insufficient documentation

## 2011-10-06 DIAGNOSIS — R269 Unspecified abnormalities of gait and mobility: Secondary | ICD-10-CM | POA: Insufficient documentation

## 2011-10-09 ENCOUNTER — Ambulatory Visit: Payer: Medicare Other | Admitting: Physical Therapy

## 2011-10-13 ENCOUNTER — Ambulatory Visit: Payer: Medicare Other | Admitting: Physical Therapy

## 2011-10-16 ENCOUNTER — Ambulatory Visit: Payer: Medicare Other | Admitting: Physical Therapy

## 2011-12-10 ENCOUNTER — Other Ambulatory Visit: Payer: Self-pay | Admitting: Internal Medicine

## 2011-12-10 DIAGNOSIS — Z1231 Encounter for screening mammogram for malignant neoplasm of breast: Secondary | ICD-10-CM

## 2011-12-11 ENCOUNTER — Ambulatory Visit
Admission: RE | Admit: 2011-12-11 | Discharge: 2011-12-11 | Disposition: A | Discharge status: Home / Self Care | Payer: BLUE CROSS/BLUE SHIELD | Source: Ambulatory Visit | Attending: Internal Medicine | Admitting: Internal Medicine

## 2011-12-11 DIAGNOSIS — Z1231 Encounter for screening mammogram for malignant neoplasm of breast: Secondary | ICD-10-CM

## 2011-12-18 ENCOUNTER — Inpatient Hospital Stay (HOSPITAL_COMMUNITY)
Admission: EM | Admit: 2011-12-18 | Discharge: 2011-12-22 | DRG: 291 | Disposition: A | Discharge status: Home / Self Care | Payer: Medicare Other | Attending: Internal Medicine | Admitting: Internal Medicine

## 2011-12-18 ENCOUNTER — Encounter (HOSPITAL_COMMUNITY): Payer: Self-pay | Admitting: Emergency Medicine

## 2011-12-18 ENCOUNTER — Emergency Department (HOSPITAL_COMMUNITY): Payer: Medicare Other

## 2011-12-18 DIAGNOSIS — R0902 Hypoxemia: Secondary | ICD-10-CM

## 2011-12-18 DIAGNOSIS — K219 Gastro-esophageal reflux disease without esophagitis: Secondary | ICD-10-CM | POA: Clinically undetermined

## 2011-12-18 DIAGNOSIS — E7800 Pure hypercholesterolemia, unspecified: Secondary | ICD-10-CM | POA: Diagnosis present

## 2011-12-18 DIAGNOSIS — J8410 Pulmonary fibrosis, unspecified: Secondary | ICD-10-CM | POA: Diagnosis present

## 2011-12-18 DIAGNOSIS — E872 Acidosis, unspecified: Secondary | ICD-10-CM | POA: Diagnosis present

## 2011-12-18 DIAGNOSIS — D649 Anemia, unspecified: Secondary | ICD-10-CM | POA: Diagnosis present

## 2011-12-18 DIAGNOSIS — J9600 Acute respiratory failure, unspecified whether with hypoxia or hypercapnia: Secondary | ICD-10-CM | POA: Diagnosis present

## 2011-12-18 DIAGNOSIS — J811 Chronic pulmonary edema: Secondary | ICD-10-CM

## 2011-12-18 DIAGNOSIS — E119 Type 2 diabetes mellitus without complications: Secondary | ICD-10-CM | POA: Diagnosis present

## 2011-12-18 DIAGNOSIS — I5031 Acute diastolic (congestive) heart failure: Principal | ICD-10-CM | POA: Diagnosis present

## 2011-12-18 DIAGNOSIS — E1165 Type 2 diabetes mellitus with hyperglycemia: Secondary | ICD-10-CM

## 2011-12-18 DIAGNOSIS — E875 Hyperkalemia: Secondary | ICD-10-CM

## 2011-12-18 DIAGNOSIS — D279 Benign neoplasm of unspecified ovary: Secondary | ICD-10-CM | POA: Diagnosis present

## 2011-12-18 DIAGNOSIS — I509 Heart failure, unspecified: Secondary | ICD-10-CM | POA: Diagnosis present

## 2011-12-18 DIAGNOSIS — E118 Type 2 diabetes mellitus with unspecified complications: Secondary | ICD-10-CM

## 2011-12-18 HISTORY — DX: Pure hypercholesterolemia, unspecified: E78.00

## 2011-12-18 HISTORY — DX: Meralgia paresthetica, unspecified lower limb: G57.10

## 2011-12-18 HISTORY — DX: Zoster without complications: B02.9

## 2011-12-18 HISTORY — DX: Urinary tract infection, site not specified: N39.0

## 2011-12-18 HISTORY — DX: Anemia, unspecified: D64.9

## 2011-12-18 HISTORY — DX: Other specified abnormal findings of blood chemistry: R79.89

## 2011-12-18 HISTORY — DX: Gastro-esophageal reflux disease without esophagitis: K21.9

## 2011-12-18 HISTORY — DX: Other complications of anesthesia, initial encounter: T88.59XA

## 2011-12-18 HISTORY — DX: Essential (primary) hypertension: I10

## 2011-12-18 HISTORY — DX: Carpal tunnel syndrome, unspecified upper limb: G56.00

## 2011-12-18 HISTORY — DX: Unspecified osteoarthritis, unspecified site: M19.90

## 2011-12-18 HISTORY — DX: Pulmonary fibrosis, unspecified: J84.10

## 2011-12-18 HISTORY — DX: Personal history of transient ischemic attack (TIA), and cerebral infarction without residual deficits: Z86.73

## 2011-12-18 HISTORY — DX: Diverticulosis of intestine, part unspecified, without perforation or abscess without bleeding: K57.90

## 2011-12-18 HISTORY — DX: Disorder of thyroid, unspecified: E07.9

## 2011-12-18 HISTORY — DX: Adverse effect of unspecified anesthetic, initial encounter: T41.45XA

## 2011-12-18 HISTORY — DX: Abnormal results of liver function studies: R94.5

## 2011-12-18 LAB — CBC
HCT: 34.3 % — ABNORMAL LOW (ref 36.0–46.0)
Hemoglobin: 11 g/dL — ABNORMAL LOW (ref 12.0–15.0)
MCH: 31.5 pg (ref 26.0–34.0)
MCHC: 32.1 g/dL (ref 30.0–36.0)
MCV: 98.3 fL (ref 78.0–100.0)
Platelets: 312 10*3/uL (ref 150–400)
RBC: 3.49 MIL/uL — ABNORMAL LOW (ref 3.87–5.11)
RDW: 16.6 % — ABNORMAL HIGH (ref 11.5–15.5)
WBC: 13.2 10*3/uL — ABNORMAL HIGH (ref 4.0–10.5)

## 2011-12-18 LAB — BASIC METABOLIC PANEL
BUN: 27 mg/dL — ABNORMAL HIGH (ref 6–23)
BUN: 28 mg/dL — ABNORMAL HIGH (ref 6–23)
CO2: 17 mEq/L — ABNORMAL LOW (ref 19–32)
Calcium: 8.7 mg/dL (ref 8.4–10.5)
Chloride: 104 mEq/L (ref 96–112)
Chloride: 103 mEq/L (ref 96–112)
Creatinine, Ser: 1.3 mg/dL — ABNORMAL HIGH (ref 0.50–1.10)
Creatinine, Ser: 1.45 mg/dL — ABNORMAL HIGH (ref 0.50–1.10)
GFR calc Af Amer: 44 mL/min — ABNORMAL LOW (ref >90)
GFR calc Af Amer: 39 mL/min — ABNORMAL LOW (ref >90)
GFR calc non Af Amer: 38 mL/min — ABNORMAL LOW (ref >90)
GFR calc non Af Amer: 34 mL/min — ABNORMAL LOW (ref >90)
Glucose, Bld: 147 mg/dL — ABNORMAL HIGH (ref 70–99)
Potassium: 5.2 mEq/L — ABNORMAL HIGH (ref 3.5–5.1)
Sodium: 137 mEq/L (ref 135–145)

## 2011-12-18 LAB — TROPONIN I: Troponin I: <0.3 ng/mL (ref <0.30)

## 2011-12-18 LAB — PRO B NATRIURETIC PEPTIDE: Pro B Natriuretic peptide (BNP): 2234 pg/mL — ABNORMAL HIGH (ref 0–450)

## 2011-12-18 LAB — CARDIAC PANEL(CRET KIN+CKTOT+MB+TROPI)
CK, MB: 3 ng/mL (ref 0.3–4.0)
Relative Index: INVALID (ref 0.0–2.5)
Total CK: 37 U/L (ref 7–177)

## 2011-12-18 LAB — LACTIC ACID, PLASMA: Lactic Acid, Venous: 2.4 mmol/L — ABNORMAL HIGH (ref 0.5–2.2)

## 2011-12-18 MED ORDER — VITAMIN D3 25 MCG (1000 UNIT) PO TABS
1000.0000 [IU] | ORAL_TABLET | Freq: Every day | ORAL | Status: DC
  Administered 2011-12-19 – 2011-12-22 (×4): 1000 [IU] via ORAL
  Filled 2011-12-18 (×4): qty 1

## 2011-12-18 MED ORDER — OCUVITE PO TABS
1.0000 | ORAL_TABLET | Freq: Every day | ORAL | Status: DC
  Administered 2011-12-19 – 2011-12-22 (×4): 1 via ORAL
  Filled 2011-12-18 (×5): qty 1

## 2011-12-18 MED ORDER — ALLOPURINOL 300 MG PO TABS
300.0000 mg | ORAL_TABLET | Freq: Every day | ORAL | Status: DC
  Administered 2011-12-19 – 2011-12-22 (×4): 300 mg via ORAL
  Filled 2011-12-18 (×5): qty 1

## 2011-12-18 MED ORDER — PANTOPRAZOLE SODIUM 40 MG PO TBEC
40.0000 mg | DELAYED_RELEASE_TABLET | Freq: Every day | ORAL | Status: DC
  Administered 2011-12-19 – 2011-12-22 (×4): 40 mg via ORAL
  Filled 2011-12-18 (×4): qty 1

## 2011-12-18 MED ORDER — FUROSEMIDE 10 MG/ML IJ SOLN
40.0000 mg | Freq: Two times a day (BID) | INTRAMUSCULAR | Status: DC
  Administered 2011-12-18 – 2011-12-21 (×6): 40 mg via INTRAVENOUS
  Filled 2011-12-18 (×8): qty 4

## 2011-12-18 MED ORDER — ALBUTEROL SULFATE (5 MG/ML) 0.5% IN NEBU: 2.5000 mg | INHALATION_SOLUTION | RESPIRATORY_TRACT | Status: DC | PRN

## 2011-12-18 MED ORDER — ENOXAPARIN SODIUM 40 MG/0.4ML ~~LOC~~ SOLN
40.0000 mg | SUBCUTANEOUS | Status: DC
  Administered 2011-12-18: 40 mg via SUBCUTANEOUS
  Filled 2011-12-18 (×2): qty 0.4

## 2011-12-18 MED ORDER — INSULIN ASPART 100 UNIT/ML ~~LOC~~ SOLN
0.0000 [IU] | Freq: Every day | SUBCUTANEOUS | Status: DC
  Administered 2011-12-21: 2 [IU] via SUBCUTANEOUS

## 2011-12-18 MED ORDER — EZETIMIBE 10 MG PO TABS
10.0000 mg | ORAL_TABLET | Freq: Every day | ORAL | Status: DC
  Administered 2011-12-19 – 2011-12-22 (×4): 10 mg via ORAL
  Filled 2011-12-18 (×5): qty 1

## 2011-12-18 MED ORDER — ASPIRIN 325 MG PO TABS
325.0000 mg | ORAL_TABLET | Freq: Every day | ORAL | Status: DC
  Administered 2011-12-19 – 2011-12-22 (×4): 325 mg via ORAL
  Filled 2011-12-18 (×4): qty 1

## 2011-12-18 MED ORDER — FUROSEMIDE 10 MG/ML IJ SOLN
20.0000 mg | Freq: Once | INTRAMUSCULAR | Status: AC
  Administered 2011-12-18: 20 mg via INTRAVENOUS
  Filled 2011-12-18: qty 4

## 2011-12-18 MED ORDER — ACETAMINOPHEN 325 MG PO TABS
650.0000 mg | ORAL_TABLET | Freq: Four times a day (QID) | ORAL | Status: DC | PRN
  Administered 2011-12-19: 650 mg via ORAL
  Filled 2011-12-18: qty 2

## 2011-12-18 MED ORDER — FERROUS SULFATE 325 (65 FE) MG PO TABS
325.0000 mg | ORAL_TABLET | Freq: Every day | ORAL | Status: DC
  Administered 2011-12-19 – 2011-12-22 (×4): 325 mg via ORAL
  Filled 2011-12-18 (×5): qty 1

## 2011-12-18 MED ORDER — SODIUM CHLORIDE 0.9 % IJ SOLN
3.0000 mL | Freq: Two times a day (BID) | INTRAMUSCULAR | Status: DC
  Administered 2011-12-18 – 2011-12-21 (×7): 3 mL via INTRAVENOUS

## 2011-12-18 MED ORDER — LISINOPRIL 5 MG PO TABS: 5.0000 mg | ORAL_TABLET | Freq: Every day | ORAL | Status: DC

## 2011-12-18 MED ORDER — AMLODIPINE BESYLATE 5 MG PO TABS
5.0000 mg | ORAL_TABLET | Freq: Every day | ORAL | Status: DC
  Administered 2011-12-19 – 2011-12-22 (×2): 5 mg via ORAL
  Filled 2011-12-18 (×4): qty 1

## 2011-12-18 MED ORDER — ATENOLOL 25 MG PO TABS
25.0000 mg | ORAL_TABLET | Freq: Two times a day (BID) | ORAL | Status: DC
  Administered 2011-12-18 – 2011-12-22 (×7): 25 mg via ORAL
  Filled 2011-12-18 (×9): qty 1

## 2011-12-18 MED ORDER — NITROGLYCERIN 2 % TD OINT
1.0000 [in_us] | TOPICAL_OINTMENT | Freq: Once | TRANSDERMAL | Status: AC
  Administered 2011-12-18: 1 [in_us] via TOPICAL
  Filled 2011-12-18: qty 30

## 2011-12-18 MED ORDER — PRIMIDONE 50 MG PO TABS
50.0000 mg | ORAL_TABLET | Freq: Two times a day (BID) | ORAL | Status: DC
  Administered 2011-12-18 – 2011-12-22 (×8): 50 mg via ORAL
  Filled 2011-12-18 (×9): qty 1

## 2011-12-18 MED ORDER — INSULIN ASPART 100 UNIT/ML ~~LOC~~ SOLN
0.0000 [IU] | Freq: Three times a day (TID) | SUBCUTANEOUS | Status: DC
  Administered 2011-12-18: 2 [IU] via SUBCUTANEOUS
  Administered 2011-12-19: 3 [IU] via SUBCUTANEOUS
  Administered 2011-12-19: 1 [IU] via SUBCUTANEOUS
  Administered 2011-12-19 – 2011-12-20 (×3): 2 [IU] via SUBCUTANEOUS
  Administered 2011-12-20: 3 [IU] via SUBCUTANEOUS
  Administered 2011-12-21 – 2011-12-22 (×4): 2 [IU] via SUBCUTANEOUS

## 2011-12-18 MED ORDER — ONDANSETRON HCL 4 MG PO TABS: 4.0000 mg | ORAL_TABLET | Freq: Four times a day (QID) | ORAL | Status: DC | PRN

## 2011-12-18 MED ORDER — ONDANSETRON HCL 4 MG/2ML IJ SOLN: 4.0000 mg | Freq: Four times a day (QID) | INTRAMUSCULAR | Status: DC | PRN

## 2011-12-18 MED ORDER — ACETAMINOPHEN 650 MG RE SUPP: 650.0000 mg | Freq: Four times a day (QID) | RECTAL | Status: DC | PRN

## 2011-12-18 NOTE — ED Notes (Signed)
Pt presenting to ed with c/o shortness of breath x 2 days pt states sent to ed from Dr. Jone Baseman office pt denies chest pain, pt states positive nausea no vomiting. Pt denies respiratory history at this time. Pt is alert and oriented at this time.

## 2011-12-18 NOTE — ED Provider Notes (Signed)
History    80yF with worsening dyspnea since yesterday. Gradual onset and progressively worsening. Has at rest and worse with exertion.  Denies CP. No fever or chills. No unusual leg pain or swelling. Pt reports hx of "scar tissue on my lungs after pneumonia" and reports previously seen by pulmonology for this then dismissed. PMHx lists pulmonary fibrosis. No home o2 requirement. Former smoker. Denies hx of CHF. No cough. Reports compliance with her medications.  CSN: [id number]  Arrival date & time 12/18/11  1141   First MD Initiated Contact with Patient 12/18/11 1150      Chief Complaint  Patient presents with  . Shortness of Breath    (Consider location/radiation/quality/duration/timing/severity/associated sxs/prior treatment) HPI  Past Medical History  Diagnosis Date  . Hypertension   . Thyroid disease   . Abnormal LFTs   . Hypercholesterolemia   . Carpal tunnel syndrome   . DJD (degenerative joint disease)   . Shingles   . Meralgia paraesthetica   . GERD (gastroesophageal reflux disease)   . Pulmonary fibrosis   . Hemorrhoids   . Diverticulosis   . Normocytic normochromic anemia   . History of recurrent TIAs   . UTI (lower urinary tract infection)     History reviewed. No pertinent past surgical history.  No family history on file.  History  Substance Use Topics  . Smoking status: Former Games developer  . Smokeless tobacco: Not on file  . Alcohol Use: No    OB History    Grav Para Term Preterm Abortions TAB SAB Ect Mult Living                  Review of Systems   Review of symptoms negative unless otherwise noted in HPI.   Allergies  Penicillins; Sulfonamide derivatives; and Celebrex  Home Medications  No current outpatient prescriptions on file.  BP 167/67  Pulse 81  Temp 97.6 F (36.4 C)  Resp 24  SpO2 98%  Physical Exam  Nursing note and vitals reviewed. Constitutional: She appears well-developed and well-nourished. No distress.  HENT:    Head: Normocephalic and atraumatic.  Eyes: Conjunctivae are normal. Pupils are equal, round, and reactive to light. Right eye exhibits no discharge. Left eye exhibits no discharge.  Neck: Neck supple.  Cardiovascular: Normal rate, regular rhythm and normal heart sounds.  Exam reveals no gallop and no friction rub.   No murmur heard. Pulmonary/Chest: Effort normal.       Crackles b/l. No wheezing.  Abdominal: Soft. She exhibits no distension. There is no tenderness.  Musculoskeletal: She exhibits no tenderness.       Very mild, symmetric nonpitting LE edema  Neurological: She is alert.  Skin: Skin is warm and dry.  Psychiatric: She has a normal mood and affect. Her behavior is normal. Thought content normal.    ED Course  Procedures (including critical care time)  Labs Reviewed  CBC - Abnormal; Notable for the following:    WBC 13.2 (*)    RBC 3.49 (*)    Hemoglobin 11.0 (*)    HCT 34.3 (*)    RDW 16.6 (*)    All other components within normal limits  BASIC METABOLIC PANEL - Abnormal; Notable for the following:    Potassium 5.2 (*)    CO2 17 (*)    Glucose, Bld 147 (*)    BUN 27 (*)    Creatinine, Ser 1.30 (*)    GFR calc non Af Amer 38 (*)    GFR  calc Af Amer 44 (*)    All other components within normal limits  PRO B NATRIURETIC PEPTIDE - Abnormal; Notable for the following:    Pro B Natriuretic peptide (BNP) 2234.0 (*)    All other components within normal limits  TROPONIN I   Dg Chest 2 View  12/18/2011  *RADIOLOGY REPORT*  Clinical Data: Shortness of breath, chest pain.  CHEST - 2 VIEW  Comparison: 04/25/2011  Findings: Heart is borderline in size.  Diffuse interstitial prominence throughout the lungs, new since prior study.  Suspect interstitial edema.  Left base atelectasis or scarring.  No effusions.  No acute bony abnormality.  IMPRESSION: Suspect mild edema/CHF.  Original Report Authenticated By: Cyndie Chime, M.D.   EKG:  Rhythm: nsr Axis: normal Rate:  71 Intervals: poor r wave progression. Left atrial enlargement. ST segments: NS ST changes t wave flattening anterolaterally which is new from previous EKG 11/29/2009.   1. Hypoxia   2. Pulmonary edema       MDM  80yF with dyspnea. Exam and CXR consistent with pulmonary edema. BNP elevated. Lasix. Hypertensive. Nitroglycerin. Mild leukocytosis but nonspecific. Consider infectious but doubt. Pt afebrile, no coughing and no focal infiltrate on CXR. Pt hypoxic on RA to 80%. No home o2 requirement. Tx initiated in ED but do not feel that can significantly improve in reasonable timeframe in ED. Will admit.        Raeford Razor, MD 12/18/11 1335

## 2011-12-18 NOTE — H&P (Addendum)
PCP:   Pearla Dubonnet, MD, MD   Chief Complaint:  Worsening dyspnea.  HPI: 78 year old female patient with history of hypertension, hyperlipidemia, pulmonary fibrosis/interstitial lung disease, urinary tract infections, ex-smoker presents to the emergency department via primary MDs office for worsening dyspnea. She indicates that she was in her usual state of health until 2 days ago. She initially started noticing dyspnea on exertion while doing her home activities which did not involve climbing stairs or strenuous activity. She had to stop her activity and take rest before resuming. She denies chest pain, chest tightness, wheezing, palpitations, cough, fever, chills, PND or orthopnea. She does give history of intermittent bilateral ankle swellings. There is no history of recent long-distance travel or asymmetrical leg swelling or pains. Her dyspnea got worse yesterday and finally she called and went to see her primary M.D. today who in turn advised her to come to the emergency department where she was hypoxic at 80% on room air with chest x-ray suggestive of pulmonary edema. Her BNP was elevated. She received a dose of IV Lasix 20 mg x1. She was placed on oxygen. Since then she says she's feeling slightly better. The hospitalist service was requested to admit for further evaluation and management. She is not on home oxygen.   Past Medical History: Past Medical History  Diagnosis Date  . Hypertension   . Thyroid disease   . Abnormal LFTs   . Hypercholesterolemia   . Carpal tunnel syndrome   . DJD (degenerative joint disease)   . Shingles   . Meralgia paraesthetica   . GERD (gastroesophageal reflux disease)   . Pulmonary fibrosis   . Hemorrhoids   . Diverticulosis   . Normocytic normochromic anemia   . History of recurrent TIAs   . UTI (lower urinary tract infection)   . Complication of anesthesia     trouble with putting her to sleep    Past Surgical History: Past Surgical  History  Procedure Date  . Back surgery     X 2  . Tonsillectomy   . Cataract extraction, bilateral   . Breast biopsy B/L- said to be negative for malignancy.    Allergies:   Allergies  Allergen Reactions  . Penicillins     REACTION: GI Upset  . Sulfonamide Derivatives   . Celebrex (Celecoxib) Rash    Medications: Prior to Admission medications   Medication Sig Start Date End Date Taking? Authorizing Provider  allopurinol (ZYLOPRIM) 300 MG tablet Take 300 mg by mouth daily.   Yes Historical Provider, MD  amLODipine (NORVASC) 5 MG tablet Take 5 mg by mouth daily.   Yes Historical Provider, MD  aspirin 325 MG tablet Take 325 mg by mouth daily. Once a day at dinner time   Yes Historical Provider, MD  atenolol (TENORMIN) 25 MG tablet Take 25 mg by mouth 2 (two) times daily. One every morning and evening   Yes Historical Provider, MD  beta carotene w/minerals (OCUVITE) tablet Take 1 tablet by mouth daily.   Yes Historical Provider, MD  cholecalciferol (VITAMIN D) 1000 UNITS tablet Take 1,000 Units by mouth daily.   Yes Historical Provider, MD  Cyanocobalamin (VITAMIN B-12 IJ) Inject 1 Syringe as directed every 30 (thirty) days. As directed   Yes Historical Provider, MD  ezetimibe (ZETIA) 10 MG tablet Take 10 mg by mouth daily.   Yes Historical Provider, MD  ferrous sulfate 325 (65 FE) MG tablet Take 325 mg by mouth daily with breakfast.   Yes Historical Provider,  MD  glipiZIDE (GLUCOTROL XL) 10 MG 24 hr tablet Take 10 mg by mouth daily.   Yes Historical Provider, MD  lansoprazole (PREVACID) 30 MG capsule Take 30 mg by mouth daily. Before a meal once a day   Yes Historical Provider, MD  lisinopril (PRINIVIL,ZESTRIL) 5 MG tablet Take 5 mg by mouth daily.   Yes Historical Provider, MD  metFORMIN (GLUCOPHAGE) 500 MG tablet Take 500 mg by mouth 2 (two) times daily with a meal.   Yes Historical Provider, MD  primidone (MYSOLINE) 50 MG tablet Take 50 mg by mouth 2 (two) times daily.   Yes  Historical Provider, MD    Family History: History reviewed. No pertinent family history. spouse has history of heart condition and uses nebulizations.   Social History:  reports that she has quit smoking. She quit smokeless tobacco use about 21 years ago. She reports that she does not drink alcohol or use illicit drugs.patient lives with her spouse and is independent of activities of daily living.   Review of Systems:   all systems reviewed and apart from history of presenting illness are negative.   Physical Exam: Filed Vitals:   12/18/11 1148 12/18/11 1207 12/18/11 1310 12/18/11 1600  BP:   139/75 158/78  Pulse:   68 86  Temp:   99.1 F (37.3 C) 98.3 F (36.8 C)  TempSrc:   Oral Oral  Resp:   24 20  Height:   5\' 1"  (1.549 m)   Weight:   62.596 kg (138 lb)   SpO2: 80% 98% 99% 93%   General appearance: Moderately built and nourished  female patient who is lying comfortably in  bed and is in no obvious distress.  Head: Nontraumatic and normocephalic.  Eyes: Pupils equally reacting to light and accommodation.  Ears: Normal  Nose: No acute findings. No sinus tenderness.  Throat: Mucosa is moist. No oral thrush.  Neck: Supple. No JVD or carotid bruit. Lymph nodes: No lymphadenopathy.  Resp: Reduced breath sounds bilateral, especially in the lower halves with coarse leathery crackles in the lower half posteriorly .  No wheezing or rhonchi. No increased work of breathing.  Cardio: First and second heart sounds heard, regular rate and rythm. No murmurs or JVD or gallop or pedal edema.   GI: Non distended. Soft and nontender. No organomegaly or masses appreciated. Normal bowel sounds heard.  Extremities: symmetric 5/5 power. Skin: No other acute findings.  Neurologic: Alert and oriented. No focal neurological deficits.   Labs on Admission:   [facility] 12/18/11 1211  NA 137  K 5.2*  CL 104  CO2 17*  GLUCOSE 147*  BUN 27*  CREATININE 1.30*  CALCIUM 8.7  MG --  PHOS --    No results found for this basename: AST:2,ALT:2,ALKPHOS:2,BILITOT:2,PROT:2,ALBUMIN:2 in the last 72 hours No results found for this basename: LIPASE:2,AMYLASE:2 in the last 72 hours  Basename 12/18/11 1211  WBC 13.2*  NEUTROABS --  HGB 11.0*  HCT 34.3*  MCV 98.3  PLT 312    Basename 12/18/11 1211  CKTOTAL --  CKMB --  CKMBINDEX --  TROPONINI <0.30   No results found for this basename: TSH,T4TOTAL,FREET3,T3FREE,THYROIDAB in the last 72 hours No results found for this basename: VITAMINB12:2,FOLATE:2,FERRITIN:2,TIBC:2,IRON:2,RETICCTPCT:2 in the last 72 hours  Other lab data:  1. Venous lactate 2.4. 2. Troponin x1: Less than 0.3. 3. ProBNP 2234.  Radiological Exams on Admission: Dg Chest 2 View  12/18/2011  *RADIOLOGY REPORT*  Clinical Data: Shortness of breath, chest pain.  CHEST - 2 VIEW  Comparison: 04/25/2011  Findings: Heart is borderline in size.  Diffuse interstitial prominence throughout the lungs, new since prior study.  Suspect interstitial edema.  Left base atelectasis or scarring.  No effusions.  No acute bony abnormality.  IMPRESSION: Suspect mild edema/CHF.  Original Report Authenticated By: Cyndie Chime, M.D.   Mm Digital Screening  12/15/2011  DG SCREEN MAMMOGRAM BILATERAL Bilateral CC and MLO view(s) were taken.  DIGITAL SCREENING MAMMOGRAM WITH CAD: The breast tissue is heterogeneously dense.  No masses or malignant type calcifications are  identified.  Compared with prior studies.  Images were processed with CAD.  IMPRESSION: No specific mammographic evidence of malignancy.  Next screening mammogram is recommended in one  year.  A result letter of this screening mammogram will be mailed directly to the patient.  ASSESSMENT: Benign - BI-RADS 2  Screening mammogram in 1 year. ,    EKG: Sinus rhythm at 71 beats per minute, normal axis, no acute changes, QTC 413 ms.  Assessment/Plan Present on Admission:  .Diastolic CHF, acute .Hypoxia .Hypoxia .PULMONARY  FIBROSIS ILD POST INFLAMMATORY CHRONIC .HYPERCHOLESTEROLEMIA .DIABETES, TYPE 2 .Hyperkalemia .Renal insufficiency .Acidosis  1. CHF, acute diastolic: Patient has no prior history of heart failure or coronary artery disease. Admit to telemetry. Cycle cardiac enzymes. Check 2-D echo to evaluate LV function. Treat with IV Lasix, monitor strict input output and daily weights. Continue current home atenolol and aspirin. Temporarily hold lisinopril secondary to hyperkalemia and mildly elevated creatinine. 2. Hypoxemia/acute respiratory failure: Probably multifactorial but most importantly from acute diastolic CHF complicating underlying pulmonary fibrosis/interstitial lung disease and possible COPD. Management as above, oxygen and bronchodilator nebulizations. 3. Hyperkalemia: Unclear etiology. We'll check BMP post diuresis later this evening. Temporarily hold lisinopril. 4. Type 2 diabetes mellitus: Hold oral hypoglycemic agents and place on sliding scale insulin. 5.  Hyperkalemia: Unclear etiology. Hold her lisinopril. Recheck BMP after diuretics. 6. Renal insufficiency/? Acute versus chronic: Patient has mild anion gap acidosis-wonder if secondary to lactic acidosis from hypoxemia. Will recheck BMP post diuretics and evaluate as needed. Check daily BMPs while patient is on diuretics. 7. Normocytic anemia: Stable. 8. History of pulmonary fibrosis/interstitial lung disease: Patient indicates that she used to see Dr. Glen Flora/pulmonology and was discharged from their service. Patient not on home oxygen. Etiology unclear. 9. Hypertension: Continue atenolol and amlodipine. Lisinopril temporarily held secondary to hyperkalemia. 10. History of recurrent TIAs: Continue aspirin. 11. History of GERD: Continue UTIs. 12. Full code. This was confirmed with the patient   Left message with Dr. Kevan Ny answering service so he can assume primary care from 12/19/11  [facility] 12/18/2011, 5:16 PM

## 2011-12-19 ENCOUNTER — Inpatient Hospital Stay (HOSPITAL_COMMUNITY): Payer: Medicare Other

## 2011-12-19 DIAGNOSIS — I517 Cardiomegaly: Secondary | ICD-10-CM

## 2011-12-19 DIAGNOSIS — K219 Gastro-esophageal reflux disease without esophagitis: Secondary | ICD-10-CM | POA: Clinically undetermined

## 2011-12-19 DIAGNOSIS — I10 Essential (primary) hypertension: Secondary | ICD-10-CM | POA: Insufficient documentation

## 2011-12-19 LAB — GLUCOSE, CAPILLARY
Glucose-Capillary: 152 mg/dL — ABNORMAL HIGH (ref 70–99)
Glucose-Capillary: 239 mg/dL — ABNORMAL HIGH (ref 70–99)
Glucose-Capillary: 183 mg/dL — ABNORMAL HIGH (ref 70–99)

## 2011-12-19 LAB — TSH: TSH: 2.789 u[IU]/mL (ref 0.350–4.500)

## 2011-12-19 LAB — CBC
HCT: 30.7 % — ABNORMAL LOW (ref 36.0–46.0)
MCHC: 33.2 g/dL (ref 30.0–36.0)
MCV: 98.4 fL (ref 78.0–100.0)
RDW: 16.7 % — ABNORMAL HIGH (ref 11.5–15.5)

## 2011-12-19 LAB — BASIC METABOLIC PANEL
BUN: 28 mg/dL — ABNORMAL HIGH (ref 6–23)
CO2: 23 mEq/L (ref 19–32)
Chloride: 103 mEq/L (ref 96–112)
Creatinine, Ser: 1.54 mg/dL — ABNORMAL HIGH (ref 0.50–1.10)

## 2011-12-19 LAB — CARDIAC PANEL(CRET KIN+CKTOT+MB+TROPI)
Total CK: 34 U/L (ref 7–177)
Troponin I: <0.3 ng/mL (ref <0.30)

## 2011-12-19 MED ORDER — ENOXAPARIN SODIUM 30 MG/0.3ML ~~LOC~~ SOLN
30.0000 mg | SUBCUTANEOUS | Status: DC
  Administered 2011-12-19 – 2011-12-21 (×3): 30 mg via SUBCUTANEOUS
  Filled 2011-12-19 (×4): qty 0.3

## 2011-12-19 NOTE — Progress Notes (Signed)
Assessed O2 Sats prior to ambulation @ rest on 2L via [place] 90% O2 Sats at rest  without O2 81%.

## 2011-12-19 NOTE — Progress Notes (Signed)
   CARE MANAGEMENT NOTE 12/19/2011  Patient:  Susan Mccoy, Susan Mccoy   Account Number:  [id number]  Date Initiated:  12/19/2011  Documentation initiated by:  Jiles Crocker  Subjective/Objective Assessment:   ADMITTED WITH SOB, CHF     Action/Plan:   PCP IS DR GATES; LIVES AT HOME WITH SPOUSE; INDEPENDENT PRIOR TO ADMISSION/ ? NEED HHC   Anticipated DC Date:  12/26/2011   Anticipated DC Plan:  HOME W HOME HEALTH SERVICES      DC Planning Services  CM consult               Status of service:  In process, will continue to follow Medicare Important Message given?  NA - LOS <3 / Initial given by admissions (If response is "NO", the following Medicare IM given date fields will be blank)  Per [facility] Regulation:  Reviewed for med. necessity/level of care/duration of stay  Comments:  12/19/2011- B Eve Rey RN, BSN, MHA

## 2011-12-19 NOTE — Progress Notes (Signed)
  Echocardiogram 2D Echocardiogram has been performed.  Jorje Guild [facility] 12/19/2011, 9:36 AM

## 2011-12-19 NOTE — Progress Notes (Addendum)
Subjective: Patient shortness of breath is much better today.  Denies fevers chills or chest pain.  Cardiac enzymes negative thus far.  Objective: Weight change:   Intake/Output Summary (Last 24 hours) at 12/19/11 0744 Last data filed at 12/19/11 1610  Gross per 24 hour  Intake    487 ml  Output      0 ml  Net    487 ml   Filed Vitals:   12/18/11 2045 12/19/11 0500 12/19/11 0555 12/19/11 0626  BP: 143/69   157/81  Pulse: 66   61  Temp: 97.4 F (36.3 C)   97.5 F (36.4 C)  TempSrc: Oral   Oral  Resp: 18   22  Height:      Weight:  61.689 kg (136 lb) 61.689 kg (136 lb)   SpO2: 93%   94%     General Appearance: Alert, cooperative, no distress, appears stated age Head: Normocephalic, without obvious abnormality, atraumatic Neck: Supple, symmetrical Lungs: Loud crackles in bases bilaterally Heart: Regular rate and rhythm, S1 and S2 normal, no murmur, rub or gallop Abdomen: Soft, non-tender, bowel sounds active all four quadrants, no masses, no organomegaly Extremities: Extremities normal, atraumatic, no cyanosis or edema Pulses: 2+ and symmetric all extremities Skin: Skin color, texture, turgor normal, no rashes or lesions Neuro: CNII-XII intact. Normal strength, sensation and reflexes throughout   Lab Results:  [facility] 12/19/11 0449 12/18/11 1838  NA 138 135  K 4.5 4.9  CL 103 103  CO2 23 17*  GLUCOSE 123* 229*  BUN 28* 28*  CREATININE 1.54* 1.45*  CALCIUM 8.9 8.2*  MG -- --  PHOS -- --   No results found for this basename: AST:2,ALT:2,ALKPHOS:2,BILITOT:2,PROT:2,ALBUMIN:2 in the last 72 hours No results found for this basename: LIPASE:2,AMYLASE:2 in the last 72 hours  Basename 12/19/11 0449 12/18/11 1211  WBC 10.3 13.2*  NEUTROABS -- --  HGB 10.2* 11.0*  HCT 30.7* 34.3*  MCV 98.4 98.3  PLT 257 312    Basename 12/19/11 0055 12/18/11 1710 12/18/11 1211  CKTOTAL 34 37 --  CKMB 2.4 3.0 --  CKMBINDEX -- -- --  TROPONINI <0.30 <0.30 <0.30   No components  found with this basename: POCBNP:3 No results found for this basename: DDIMER:2 in the last 72 hours No results found for this basename: HGBA1C:2 in the last 72 hours No results found for this basename: CHOL:2,HDL:2,LDLCALC:2,TRIG:2,CHOLHDL:2,LDLDIRECT:2 in the last 72 hours No results found for this basename: TSH,T4TOTAL,FREET3,T3FREE,THYROIDAB in the last 72 hours No results found for this basename: VITAMINB12:2,FOLATE:2,FERRITIN:2,TIBC:2,IRON:2,RETICCTPCT:2 in the last 72 hours  Studies/Results: Dg Chest 2 View  12/18/2011  *RADIOLOGY REPORT*  Clinical Data: Shortness of breath, chest pain.  CHEST - 2 VIEW  Comparison: 04/25/2011  Findings: Heart is borderline in size.  Diffuse interstitial prominence throughout the lungs, new since prior study.  Suspect interstitial edema.  Left base atelectasis or scarring.  No effusions.  No acute bony abnormality.  IMPRESSION: Suspect mild edema/CHF.  Original Report Authenticated By: Cyndie Chime, M.D.   Medications: Scheduled Meds:   . allopurinol  300 mg Oral Daily  . amLODipine  5 mg Oral Daily  . aspirin  325 mg Oral Daily  . atenolol  25 mg Oral BID  . beta carotene w/minerals  1 tablet Oral Daily  . cholecalciferol  1,000 Units Oral Daily  . enoxaparin  40 mg Subcutaneous Q24H  . ezetimibe  10 mg Oral Daily  . ferrous sulfate  325 mg Oral Q breakfast  . furosemide  20 mg Intravenous Once  . furosemide  40 mg Intravenous Q12H  . insulin aspart  0-5 Units Subcutaneous QHS  . insulin aspart  0-9 Units Subcutaneous TID WC  . nitroGLYCERIN  1 inch Topical Once  . pantoprazole  40 mg Oral Daily  . primidone  50 mg Oral BID  . sodium chloride  3 mL Intravenous Q12H  . DISCONTD: lisinopril  5 mg Oral Daily   Continuous Infusions:  PRN Meds:.acetaminophen, acetaminophen, albuterol, ondansetron (ZOFRAN) IV, ondansetron  Assessment/Plan:   Patient Active Problem List  Diagnoses Date Noted  . Diastolic CHF, acute - symptomatically better.   Continue IV Lasix 40 every 12 hours.  We'll be checking 2-D echocardiogram.  Possible that this is ischemically induced.  [facility] consult cardiology  12/18/2011  . Hypoxia - improved with diuresis.  Likely multifactorial with interstitial lung disease/chronic fibrosis.  Patient smoked for about 20 years but stopped about 15 years ago.   12/18/2011  . Hyperkalemia - better.  Resume ACE inhibitor soon  12/18/2011  . Renal insufficiency - aware.  Continue daily basic metabolic profile.  May improve with diuresis  12/18/2011  . Acidosis - resolved  12/18/2011  . DIABETES, TYPE 2 - oral hypoglycemics held.  On sliding scale NovoLog insulin  07/06/2008  . ANEMIA - chronic and stable  07/06/2008  . PULMONARY FIBROSIS ILD POST INFLAMMATORY CHRONIC - used to be followed by pulmonary at [facility] - consider pulmonary consultation.  Tick sedimentation rate for possible inflammatory activity  07/06/2008  . HYPERCHOLESTEROLEMIA - check lipid profile  07/05/2008  . DEPRESSION - mood is okay currently History of TIAs  - continue aspirin  GERD  History of UTI  Full code  Check TSH and B12  07/05/2008     LOS: 1 day   Emmry Hinsch NEVILL 12/19/2011, 7:44 AM

## 2011-12-20 ENCOUNTER — Inpatient Hospital Stay (HOSPITAL_COMMUNITY): Payer: Medicare Other

## 2011-12-20 LAB — BASIC METABOLIC PANEL
CO2: 19 mEq/L (ref 19–32)
Chloride: 98 mEq/L (ref 96–112)
Creatinine, Ser: 1.55 mg/dL — ABNORMAL HIGH (ref 0.50–1.10)
GFR calc Af Amer: 36 mL/min — ABNORMAL LOW (ref >90)
Potassium: 5.2 mEq/L — ABNORMAL HIGH (ref 3.5–5.1)

## 2011-12-20 LAB — SEDIMENTATION RATE: Sed Rate: 32 mm/hr — ABNORMAL HIGH (ref 0–22)

## 2011-12-20 LAB — CBC
HCT: 33.9 % — ABNORMAL LOW (ref 36.0–46.0)
Hemoglobin: 11.2 g/dL — ABNORMAL LOW (ref 12.0–15.0)
MCHC: 33 g/dL (ref 30.0–36.0)
RDW: 16.5 % — ABNORMAL HIGH (ref 11.5–15.5)
WBC: 12.2 10*3/uL — ABNORMAL HIGH (ref 4.0–10.5)

## 2011-12-20 LAB — GLUCOSE, CAPILLARY: Glucose-Capillary: 173 mg/dL — ABNORMAL HIGH (ref 70–99)

## 2011-12-20 LAB — DIFFERENTIAL
Basophils Absolute: 0.1 10*3/uL (ref 0.0–0.1)
Basophils Relative: 1 % (ref 0–1)
Lymphocytes Relative: 22 % (ref 12–46)
Monocytes Absolute: 0.9 10*3/uL (ref 0.1–1.0)
Neutro Abs: 6.9 10*3/uL (ref 1.7–7.7)
Neutrophils Relative %: 57 % (ref 43–77)

## 2011-12-20 LAB — PRO B NATRIURETIC PEPTIDE: Pro B Natriuretic peptide (BNP): 1394 pg/mL — ABNORMAL HIGH (ref 0–450)

## 2011-12-20 NOTE — Progress Notes (Signed)
Subjective: The patient states dyspnea has improved.  No significant cough and no chest pain. She states she did not get a chance to ambulate yesterday.  Objective: Vital signs in last 24 hours: Temp:  [97.6 F (36.4 C)-98.7 F (37.1 C)] 98.7 F (37.1 C) (05/18 0530) Pulse Rate:  [60-84] 60  (05/18 0530) Resp:  [14-20] 14  (05/18 0530) BP: (97-137)/(57-81) 129/77 mmHg (05/18 0530) SpO2:  [93 %-97 %] 97 % (05/18 0530) Weight:  [60.691 kg (133 lb 12.8 oz)] 60.691 kg (133 lb 12.8 oz) (05/18 0530) Weight change: -1.905 kg (-4 lb 3.2 oz) Last BM Date: 12/20/11  Intake/Output from previous day: 05/17 0701 - 05/18 0700 In: 363 [P.O.:360; I.V.:3] Out: 975 [Urine:975] Intake/Output this shift:    Resp: rales bilaterally Cardio: regular rate and rhythm, S1, S2 normal, no murmur, click, rub or gallop Extremities: extremities normal, atraumatic, no cyanosis or edema  Lab Results:  [facility] 12/20/11 0524 12/19/11 0449  WBC 12.2* 10.3  HGB 11.2* 10.2*  HCT 33.9* 30.7*  PLT 274 257   BMET  Basename 12/20/11 0524 12/19/11 0449  NA 133* 138  K 5.2* 4.5  CL 98 103  CO2 19 23  GLUCOSE 149* 123*  BUN 34* 28*  CREATININE 1.55* 1.54*  CALCIUM 9.1 8.9    Studies/Results: X-ray Chest Pa And Lateral   12/19/2011  *RADIOLOGY REPORT*  Clinical Data: Shortness of breath.  Hypoxia.  Congestive heart failure.  Pulmonary edema.  CHEST - 2 VIEW  Comparison: 12/18/2011 and 04/25/2011  Findings: Interstitial markings are less prominent than on the prior study particularly posteriorly at the bases.  There is chronic mild cardiomegaly.  Pulmonary vascularity remains prominent.  No discrete effusions.  No acute osseous abnormality.  IMPRESSION: Persistent pulmonary vascular congestion.  Improved mild interstitial edema.  Original Report Authenticated By: Gwynn Burly, M.D.   Dg Chest 2 View  12/18/2011  *RADIOLOGY REPORT*  Clinical Data: Shortness of breath, chest pain.  CHEST - 2 VIEW   Comparison: 04/25/2011  Findings: Heart is borderline in size.  Diffuse interstitial prominence throughout the lungs, new since prior study.  Suspect interstitial edema.  Left base atelectasis or scarring.  No effusions.  No acute bony abnormality.  IMPRESSION: Suspect mild edema/CHF.  Original Report Authenticated By: Cyndie Chime, M.D.    Medications:  Scheduled:   . allopurinol  300 mg Oral Daily  . amLODipine  5 mg Oral Daily  . aspirin  325 mg Oral Daily  . atenolol  25 mg Oral BID  . beta carotene w/minerals  1 tablet Oral Daily  . cholecalciferol  1,000 Units Oral Daily  . enoxaparin  30 mg Subcutaneous Q24H  . ezetimibe  10 mg Oral Daily  . ferrous sulfate  325 mg Oral Q breakfast  . furosemide  40 mg Intravenous Q12H  . insulin aspart  0-5 Units Subcutaneous QHS  . insulin aspart  0-9 Units Subcutaneous TID WC  . pantoprazole  40 mg Oral Daily  . primidone  50 mg Oral BID  . sodium chloride  3 mL Intravenous Q12H  . DISCONTD: enoxaparin  40 mg Subcutaneous Q24H    Assessment/Plan: 1. chf- diastolic dysfunction.  She is clinically better echo does reveal grade I diastolic dysfunction and normal ef.  Her potassium is still elevated despite diuresis.  She will ambulate today and possibly reduce lasix to once a day po tomorrow depending on labs. 2. Pulmonary fibrosis at baseline  LOS: 2 days  Ginette Otto 12/20/2011, 7:31 AM

## 2011-12-21 LAB — BASIC METABOLIC PANEL
BUN: 44 mg/dL — ABNORMAL HIGH (ref 6–23)
CO2: 24 mEq/L (ref 19–32)
Calcium: 9.2 mg/dL (ref 8.4–10.5)
Creatinine, Ser: 1.75 mg/dL — ABNORMAL HIGH (ref 0.50–1.10)
GFR calc non Af Amer: 27 mL/min — ABNORMAL LOW (ref >90)
Glucose, Bld: 189 mg/dL — ABNORMAL HIGH (ref 70–99)

## 2011-12-21 LAB — GLUCOSE, CAPILLARY: Glucose-Capillary: 229 mg/dL — ABNORMAL HIGH (ref 70–99)

## 2011-12-21 LAB — VITAMIN B12: Vitamin B-12: 414 pg/mL (ref 211–911)

## 2011-12-21 MED ORDER — FUROSEMIDE 40 MG PO TABS
40.0000 mg | ORAL_TABLET | Freq: Every day | ORAL | Status: DC
  Administered 2011-12-21 – 2011-12-22 (×2): 40 mg via ORAL
  Filled 2011-12-21 (×2): qty 1

## 2011-12-21 NOTE — Progress Notes (Signed)
Patient ambulated in hallway approximately 167 feet with oxygen @ 2 liters via nasal cannula and assist X 1, tolerated well. Oxygen Saturation remained 92-94%. Patient denied any SOB. Patient was only able to ambulate 37 feet on room air before O2 sats dropped 84-87% . No distress noted sat immediately back to normal after oxygen applied.

## 2011-12-21 NOTE — Progress Notes (Signed)
Subjective: She states she has still not had an opportunity to ambulate.  States she feels week but breathing is better  Objective: Vital signs in last 24 hours: Temp:  [97.3 F (36.3 C)-99.2 F (37.3 C)] 98.6 F (37 C) (05/19 0410) Pulse Rate:  [60-65] 60  (05/19 0410) Resp:  [19-20] 20  (05/19 0410) BP: (98-135)/(59-72) 135/71 mmHg (05/19 0410) SpO2:  [94 %-96 %] 96 % (05/19 0410) Weight:  [60.918 kg (134 lb 4.8 oz)] 60.918 kg (134 lb 4.8 oz) (05/19 0410) Weight change: 0.227 kg (8 oz) Last BM Date: 12/20/11  Intake/Output from previous day: 05/18 0701 - 05/19 0700 In: 1440 [P.O.:1440] Out: 1800 [Urine:1200; Stool:600] Intake/Output this shift: Total I/O In: -  Out: 100 [Urine:100]  Resp: rales bilaterally Cardio: regular rate and rhythm, S1, S2 normal, no murmur, click, rub or gallop Extremities: extremities normal, atraumatic, no cyanosis or edema  Lab Results:  [facility] 12/20/11 0524 12/19/11 0449  WBC 12.2* 10.3  HGB 11.2* 10.2*  HCT 33.9* 30.7*  PLT 274 257   BMET  Basename 12/21/11 0521 12/20/11 0524  NA 132* 133*  K 5.1 5.2*  CL 94* 98  CO2 24 19  GLUCOSE 189* 149*  BUN 44* 34*  CREATININE 1.75* 1.55*  CALCIUM 9.2 9.1    Studies/Results: Dg Chest 2 View  12/20/2011  *RADIOLOGY REPORT*  Clinical Data: Shortness of breath, congestive heart failure  CHEST - 2 VIEW  Comparison: 12/19/2011; 12/18/2011; 04/25/2011; chest CT - 07/03/2008  Findings: Grossly unchanged cardiac silhouette and mediastinal contours with atherosclerotic calcifications within the thoracic aorta. Grossly unchanged bibasilar and peripheral predominant coarse reticular opacities.  No new focal airspace opacities. There is persistent blunting of bilateral costophrenic angles without definite pleural effusion.  No pneumothorax.  Grossly unchanged bones.  IMPRESSION: Grossly unchanged basilar and peripheral predominant coarse reticular opacities favored to represent pulmonary fibrosis.  No  focal airspace opacities to suggest pneumonia.  No definite evidence of pulmonary edema.  Original Report Authenticated By: Waynard Reeds, M.D.   X-ray Chest Pa And Lateral   12/19/2011  *RADIOLOGY REPORT*  Clinical Data: Shortness of breath.  Hypoxia.  Congestive heart failure.  Pulmonary edema.  CHEST - 2 VIEW  Comparison: 12/18/2011 and 04/25/2011  Findings: Interstitial markings are less prominent than on the prior study particularly posteriorly at the bases.  There is chronic mild cardiomegaly.  Pulmonary vascularity remains prominent.  No discrete effusions.  No acute osseous abnormality.  IMPRESSION: Persistent pulmonary vascular congestion.  Improved mild interstitial edema.  Original Report Authenticated By: Gwynn Burly, M.D.    Medications: I have reviewed the patient's current medications.  Assessment/Plan: 1. Diastolic dysfuncion: bun and creat now going up.  She appear euvolemic. I will reduce lasix 2. Dm cbg 173 fair control on sliding scale 3. ambulate  LOS: 3 days   Susan Mccoy 12/21/2011, 8:11 AM

## 2011-12-22 LAB — GLUCOSE, CAPILLARY: Glucose-Capillary: 170 mg/dL — ABNORMAL HIGH (ref 70–99)

## 2011-12-22 LAB — PRO B NATRIURETIC PEPTIDE: Pro B Natriuretic peptide (BNP): 527.7 pg/mL — ABNORMAL HIGH (ref 0–450)

## 2011-12-22 LAB — BASIC METABOLIC PANEL
CO2: 25 mEq/L (ref 19–32)
Chloride: 94 mEq/L — ABNORMAL LOW (ref 96–112)
Creatinine, Ser: 2.01 mg/dL — ABNORMAL HIGH (ref 0.50–1.10)

## 2011-12-22 MED ORDER — FUROSEMIDE 40 MG PO TABS: 40.0000 mg | ORAL_TABLET | Freq: Every day | ORAL | Status: DC

## 2011-12-22 NOTE — Discharge Summary (Signed)
Physician Discharge Summary  NAME:Susan Mccoy  RUE:[id number]  DOB: 1932/07/12   Admit date: 12/18/2011 Discharge date: 12/22/2011  Discharge Diagnoses:  Principal Problem:  *Diastolic CHF, acute - much improved.  Continue on diuretics and home O2 therapy.  Lasix added to regimen. Active Problems:  DIABETES, TYPE 2  HYPERCHOLESTEROLEMIA  PULMONARY FIBROSIS ILD POST INFLAMMATORY CHRONIC  Hypoxia - improved  Hyperkalemia - resolved  Renal insufficiency - stage III renal insufficiency  Acidosis - resolved with treatment of hypoxemia  GERD (gastroesophageal reflux disease)   Discharge Condition: Improved  Hospital Course: [name] is a very pleasant 78 year old female with history of pulmonary fibrosis and interstitial lung disease, an ex-smoker, who also has hypertension and hyperlipidemia.  She was in her usual state of health until 2 days prior to admission when she noticed shortness of breath with exertion with home activities.  She denies fevers or chills.  She was seen in the office on 12/18/2011 and was markedly hypoxemic on room air with chest x-ray suggesting pulmonary edema.  She has received IV diuretics with excellent diuresis here in the hospital and is doing well except when ambulating she still requires some oxygen.  She will be a new start on home oxygen.  Serial cardiac enzymes were normal during her admission.  2-D echocardiogram performed on 12/19/2011 revealed an EF of 6065% with grade 1 diastolic dysfunction.  She'll be discharged home on an ACE inhibitor and Lasix.  Potassium levels were slightly high on admission and now are normal.  Discharge laboratories include a pro BNP of 1394 on 5/18 down from 2630 on 5/17.  Final basic metabolic profile and BNP are pending  Filed Vitals:   12/21/11 1359 12/21/11 1400 12/21/11 2147 12/22/11 0617  BP: 113/62  102/65 151/84  Pulse: 66  73 55  Temp: 97.9 F (36.6 C)  98 F (36.7 C) 97.5 F (36.4 C)  TempSrc: Oral   Oral Oral  Resp: 20  20 20   Height:      Weight:    61.598 kg (135 lb 12.8 oz)  SpO2: 88% 95% 97% 98%    General Appearance: Alert, cooperative, no distress, appears stated age  Head: Normocephalic, without obvious abnormality, atraumatic  Neck: Supple, symmetrical  Lungs: Dry crackles in bases bilaterally  Heart: Regular rate and rhythm, S1 and S2 normal, no murmur, rub or gallop  Abdomen: Soft, non-tender, bowel sounds active all four quadrants, no masses, no organomegaly  Extremities: Extremities normal, atraumatic, no cyanosis or edema  Pulses: 2+ and symmetric all extremities  Skin: Skin color, texture, turgor normal, no rashes or lesions  Neuro: CNII-XII intact. Normal strength, sensation and reflexes throughout   Consults: We'll consult cardiology as an outpatient  Disposition:  much improved  Discharge Orders    Future Orders Please Complete By Expires   For home use only DME oxygen      Comments:   Patient needs to be set up for home oxygen and will need 2 liters nasal cannula O2 portable to return home   Ambulatory referral to Social Work      Comments:   I need patient to have home nursing for CHF and for home oxygen management.  Could not find tabs for case manager.  Please help me with this referral patient will need to go home on 2 liters of nasal cannula O2   Diet - low sodium heart healthy      Increase activity slowly      Call  MD for:  temperature >100.4      Call MD for:  difficulty breathing, headache or visual disturbances      (HEART FAILURE PATIENTS) Call MD:  Anytime you have any of the following symptoms: 1) 3 pound weight gain in 24 hours or 5 pounds in 1 week 2) shortness of breath, with or without a dry hacking cough 3) swelling in the hands, feet or stomach 4) if you have to sleep on extra pillows at night in order to breathe.      Self care home management training   12/21/12     Medication List  As of 12/22/2011  6:55 AM   TAKE these medications           allopurinol 300 MG tablet   Commonly known as: ZYLOPRIM   Take 300 mg by mouth daily.      amLODipine 5 MG tablet   Commonly known as: NORVASC   Take 5 mg by mouth daily.      aspirin 325 MG tablet   Take 325 mg by mouth daily. Once a day at dinner time      atenolol 25 MG tablet   Commonly known as: TENORMIN   Take 25 mg by mouth 2 (two) times daily. One every morning and evening      beta carotene w/minerals tablet   Take 1 tablet by mouth daily.      cholecalciferol 1000 UNITS tablet   Commonly known as: VITAMIN D   Take 1,000 Units by mouth daily.      ezetimibe 10 MG tablet   Commonly known as: ZETIA   Take 10 mg by mouth daily.      ferrous sulfate 325 (65 FE) MG tablet   Take 325 mg by mouth daily with breakfast.      furosemide 40 MG tablet   Commonly known as: LASIX   Take 1 tablet (40 mg total) by mouth daily.      glipiZIDE 10 MG 24 hr tablet   Commonly known as: GLUCOTROL XL   Take 10 mg by mouth daily.      lansoprazole 30 MG capsule   Commonly known as: PREVACID   Take 30 mg by mouth daily. Before a meal once a day      lisinopril 5 MG tablet   Commonly known as: PRINIVIL,ZESTRIL   Take 5 mg by mouth daily.      metFORMIN 500 MG tablet   Commonly known as: GLUCOPHAGE   Take 500 mg by mouth 2 (two) times daily with a meal.      primidone 50 MG tablet   Commonly known as: MYSOLINE   Take 50 mg by mouth 2 (two) times daily.      VITAMIN B-12 IJ   Inject 1 Syringe as directed every 30 (thirty) days. As directed             Things to follow up in the outpatient setting: Usual heart failure parameters   Time coordinating discharge: 38 minutes  The results of significant diagnostics from this hospitalization (including imaging, microbiology, ancillary and laboratory) are listed below for reference.    Significant Diagnostic Studies: Dg Chest 2 View  12/20/2011  *RADIOLOGY REPORT*  Clinical Data: Shortness of breath, congestive  heart failure  CHEST - 2 VIEW  Comparison: 12/19/2011; 12/18/2011; 04/25/2011; chest CT - 07/03/2008  Findings: Grossly unchanged cardiac silhouette and mediastinal contours with atherosclerotic calcifications within the thoracic aorta. Grossly unchanged bibasilar and peripheral  predominant coarse reticular opacities.  No new focal airspace opacities. There is persistent blunting of bilateral costophrenic angles without definite pleural effusion.  No pneumothorax.  Grossly unchanged bones.  IMPRESSION: Grossly unchanged basilar and peripheral predominant coarse reticular opacities favored to represent pulmonary fibrosis.  No focal airspace opacities to suggest pneumonia.  No definite evidence of pulmonary edema.  Original Report Authenticated By: Waynard Reeds, M.D.   X-ray Chest Pa And Lateral   12/19/2011  *RADIOLOGY REPORT*  Clinical Data: Shortness of breath.  Hypoxia.  Congestive heart failure.  Pulmonary edema.  CHEST - 2 VIEW  Comparison: 12/18/2011 and 04/25/2011  Findings: Interstitial markings are less prominent than on the prior study particularly posteriorly at the bases.  There is chronic mild cardiomegaly.  Pulmonary vascularity remains prominent.  No discrete effusions.  No acute osseous abnormality.  IMPRESSION: Persistent pulmonary vascular congestion.  Improved mild interstitial edema.  Original Report Authenticated By: Gwynn Burly, M.D.   Dg Chest 2 View  12/18/2011  *RADIOLOGY REPORT*  Clinical Data: Shortness of breath, chest pain.  CHEST - 2 VIEW  Comparison: 04/25/2011  Findings: Heart is borderline in size.  Diffuse interstitial prominence throughout the lungs, new since prior study.  Suspect interstitial edema.  Left base atelectasis or scarring.  No effusions.  No acute bony abnormality.  IMPRESSION: Suspect mild edema/CHF.  Original Report Authenticated By: Cyndie Chime, M.D.   Mm Digital Screening  12/15/2011  DG SCREEN MAMMOGRAM BILATERAL Bilateral CC and MLO view(s)  were taken.  DIGITAL SCREENING MAMMOGRAM WITH CAD: The breast tissue is heterogeneously dense.  No masses or malignant type calcifications are  identified.  Compared with prior studies.  Images were processed with CAD.  IMPRESSION: No specific mammographic evidence of malignancy.  Next screening mammogram is recommended in one  year.  A result letter of this screening mammogram will be mailed directly to the patient.  ASSESSMENT: Benign - BI-RADS 2  Screening mammogram in 1 year. ,    Microbiology: No results found for this or any previous visit (from the past 240 hour(s)).   Labs: Results for orders placed during the hospital encounter of 12/18/11  CBC      Component Value Range   WBC 13.2 (*) 4.0 - 10.5 (K/uL)   RBC 3.49 (*) 3.87 - 5.11 (MIL/uL)   Hemoglobin 11.0 (*) 12.0 - 15.0 (g/dL)   HCT 19.1 (*) 47.8 - 46.0 (%)   MCV 98.3  78.0 - 100.0 (fL)   MCH 31.5  26.0 - 34.0 (pg)   MCHC 32.1  30.0 - 36.0 (g/dL)   RDW 29.5 (*) 62.1 - 15.5 (%)   Platelets 312  150 - 400 (K/uL)  BASIC METABOLIC PANEL      Component Value Range   Sodium 137  135 - 145 (mEq/L)   Potassium 5.2 (*) 3.5 - 5.1 (mEq/L)   Chloride 104  96 - 112 (mEq/L)   CO2 17 (*) 19 - 32 (mEq/L)   Glucose, Bld 147 (*) 70 - 99 (mg/dL)   BUN 27 (*) 6 - 23 (mg/dL)   Creatinine, Ser 3.08 (*) 0.50 - 1.10 (mg/dL)   Calcium 8.7  8.4 - 65.7 (mg/dL)   GFR calc non Af Amer 38 (*) >90 (mL/min)   GFR calc Af Amer 44 (*) >90 (mL/min)  TROPONIN I      Component Value Range   Troponin I <0.30  <0.30 (ng/mL)  PRO B NATRIURETIC PEPTIDE      Component  Value Range   Pro B Natriuretic peptide (BNP) 2234.0 (*) 0 - 450 (pg/mL)  LACTIC ACID, PLASMA      Component Value Range   Lactic Acid, Venous 2.4 (*) 0.5 - 2.2 (mmol/L)  CARDIAC PANEL(CRET KIN+CKTOT+MB+TROPI)      Component Value Range   Total CK 37  7 - 177 (U/L)   CK, MB 3.0  0.3 - 4.0 (ng/mL)   Troponin I <0.30  <0.30 (ng/mL)   Relative Index RELATIVE INDEX IS INVALID  0.0 - 2.5    CARDIAC PANEL(CRET KIN+CKTOT+MB+TROPI)      Component Value Range   Total CK 34  7 - 177 (U/L)   CK, MB 2.4  0.3 - 4.0 (ng/mL)   Troponin I <0.30  <0.30 (ng/mL)   Relative Index RELATIVE INDEX IS INVALID  0.0 - 2.5   PRO B NATRIURETIC PEPTIDE      Component Value Range   Pro B Natriuretic peptide (BNP) 2630.0 (*) 0 - 450 (pg/mL)  BASIC METABOLIC PANEL      Component Value Range   Sodium 138  135 - 145 (mEq/L)   Potassium 4.5  3.5 - 5.1 (mEq/L)   Chloride 103  96 - 112 (mEq/L)   CO2 23  19 - 32 (mEq/L)   Glucose, Bld 123 (*) 70 - 99 (mg/dL)   BUN 28 (*) 6 - 23 (mg/dL)   Creatinine, Ser 1.19 (*) 0.50 - 1.10 (mg/dL)   Calcium 8.9  8.4 - 14.7 (mg/dL)   GFR calc non Af Amer 31 (*) >90 (mL/min)   GFR calc Af Amer 36 (*) >90 (mL/min)  CBC      Component Value Range   WBC 10.3  4.0 - 10.5 (K/uL)   RBC 3.12 (*) 3.87 - 5.11 (MIL/uL)   Hemoglobin 10.2 (*) 12.0 - 15.0 (g/dL)   HCT 82.9 (*) 56.2 - 46.0 (%)   MCV 98.4  78.0 - 100.0 (fL)   MCH 32.7  26.0 - 34.0 (pg)   MCHC 33.2  30.0 - 36.0 (g/dL)   RDW 13.0 (*) 86.5 - 15.5 (%)   Platelets 257  150 - 400 (K/uL)  BASIC METABOLIC PANEL      Component Value Range   Sodium 135  135 - 145 (mEq/L)   Potassium 4.9  3.5 - 5.1 (mEq/L)   Chloride 103  96 - 112 (mEq/L)   CO2 17 (*) 19 - 32 (mEq/L)   Glucose, Bld 229 (*) 70 - 99 (mg/dL)   BUN 28 (*) 6 - 23 (mg/dL)   Creatinine, Ser 7.84 (*) 0.50 - 1.10 (mg/dL)   Calcium 8.2 (*) 8.4 - 10.5 (mg/dL)   GFR calc non Af Amer 34 (*) >90 (mL/min)   GFR calc Af Amer 39 (*) >90 (mL/min)  GLUCOSE, CAPILLARY      Component Value Range   Glucose-Capillary 170 (*) 70 - 99 (mg/dL)  CARDIAC PANEL(CRET KIN+CKTOT+MB+TROPI)      Component Value Range   Total CK 30  7 - 177 (U/L)   CK, MB 2.5  0.3 - 4.0 (ng/mL)   Troponin I <0.30  <0.30 (ng/mL)   Relative Index RELATIVE INDEX IS INVALID  0.0 - 2.5   GLUCOSE, CAPILLARY      Component Value Range   Glucose-Capillary 193 (*) 70 - 99 (mg/dL)  TSH       Component Value Range   TSH 2.789  0.350 - 4.500 (uIU/mL)  GLUCOSE, CAPILLARY      Component Value Range  Glucose-Capillary 126 (*) 70 - 99 (mg/dL)  GLUCOSE, CAPILLARY      Component Value Range   Glucose-Capillary 152 (*) 70 - 99 (mg/dL)  PRO B NATRIURETIC PEPTIDE      Component Value Range   Pro B Natriuretic peptide (BNP) 1394.0 (*) 0 - 450 (pg/mL)  CBC      Component Value Range   WBC 12.2 (*) 4.0 - 10.5 (K/uL)   RBC 3.45 (*) 3.87 - 5.11 (MIL/uL)   Hemoglobin 11.2 (*) 12.0 - 15.0 (g/dL)   HCT 16.1 (*) 09.6 - 46.0 (%)   MCV 98.3  78.0 - 100.0 (fL)   MCH 32.5  26.0 - 34.0 (pg)   MCHC 33.0  30.0 - 36.0 (g/dL)   RDW 04.5 (*) 40.9 - 15.5 (%)   Platelets 274  150 - 400 (K/uL)  DIFFERENTIAL      Component Value Range   Neutrophils Relative 57  43 - 77 (%)   Neutro Abs 6.9  1.7 - 7.7 (K/uL)   Lymphocytes Relative 22  12 - 46 (%)   Lymphs Abs 2.7  0.7 - 4.0 (K/uL)   Monocytes Relative 7  3 - 12 (%)   Monocytes Absolute 0.9  0.1 - 1.0 (K/uL)   Eosinophils Relative 14 (*) 0 - 5 (%)   Eosinophils Absolute 1.7 (*) 0.0 - 0.7 (K/uL)   Basophils Relative 1  0 - 1 (%)   Basophils Absolute 0.1  0.0 - 0.1 (K/uL)  BASIC METABOLIC PANEL      Component Value Range   Sodium 133 (*) 135 - 145 (mEq/L)   Potassium 5.2 (*) 3.5 - 5.1 (mEq/L)   Chloride 98  96 - 112 (mEq/L)   CO2 19  19 - 32 (mEq/L)   Glucose, Bld 149 (*) 70 - 99 (mg/dL)   BUN 34 (*) 6 - 23 (mg/dL)   Creatinine, Ser 8.11 (*) 0.50 - 1.10 (mg/dL)   Calcium 9.1  8.4 - 91.4 (mg/dL)   GFR calc non Af Amer 31 (*) >90 (mL/min)   GFR calc Af Amer 36 (*) >90 (mL/min)  VITAMIN B12      Component Value Range   Vitamin B-12 414  211 - 911 (pg/mL)  SEDIMENTATION RATE      Component Value Range   Sed Rate 32 (*) 0 - 22 (mm/hr)  GLUCOSE, CAPILLARY      Component Value Range   Glucose-Capillary 239 (*) 70 - 99 (mg/dL)  GLUCOSE, CAPILLARY      Component Value Range   Glucose-Capillary 183 (*) 70 - 99 (mg/dL)  GLUCOSE, CAPILLARY       Component Value Range   Glucose-Capillary 173 (*) 70 - 99 (mg/dL)  GLUCOSE, CAPILLARY      Component Value Range   Glucose-Capillary 180 (*) 70 - 99 (mg/dL)  GLUCOSE, CAPILLARY      Component Value Range   Glucose-Capillary 158 (*) 70 - 99 (mg/dL)  BASIC METABOLIC PANEL      Component Value Range   Sodium 132 (*) 135 - 145 (mEq/L)   Potassium 5.1  3.5 - 5.1 (mEq/L)   Chloride 94 (*) 96 - 112 (mEq/L)   CO2 24  19 - 32 (mEq/L)   Glucose, Bld 189 (*) 70 - 99 (mg/dL)   BUN 44 (*) 6 - 23 (mg/dL)   Creatinine, Ser 7.82 (*) 0.50 - 1.10 (mg/dL)   Calcium 9.2  8.4 - 95.6 (mg/dL)   GFR calc non Af Amer 27 (*) >90 (  mL/min)   GFR calc Af Amer 31 (*) >90 (mL/min)  GLUCOSE, CAPILLARY      Component Value Range   Glucose-Capillary 182 (*) 70 - 99 (mg/dL)  GLUCOSE, CAPILLARY      Component Value Range   Glucose-Capillary 186 (*) 70 - 99 (mg/dL)  GLUCOSE, CAPILLARY      Component Value Range   Glucose-Capillary 156 (*) 70 - 99 (mg/dL)  GLUCOSE, CAPILLARY      Component Value Range   Glucose-Capillary 166 (*) 70 - 99 (mg/dL)  GLUCOSE, CAPILLARY      Component Value Range   Glucose-Capillary 229 (*) 70 - 99 (mg/dL)     Signed: Salisha Bardsley NEVILL 12/22/2011, 6:55 AM

## 2012-01-05 ENCOUNTER — Encounter (HOSPITAL_COMMUNITY): Payer: Self-pay | Admitting: *Deleted

## 2012-01-05 ENCOUNTER — Observation Stay (HOSPITAL_COMMUNITY)
Admission: EM | Admit: 2012-01-05 | Discharge: 2012-01-06 | Disposition: A | Discharge status: Home / Self Care | Payer: Medicare Other | Attending: Internal Medicine | Admitting: Internal Medicine

## 2012-01-05 ENCOUNTER — Emergency Department (HOSPITAL_COMMUNITY): Payer: Medicare Other

## 2012-01-05 DIAGNOSIS — Z79899 Other long term (current) drug therapy: Secondary | ICD-10-CM | POA: Insufficient documentation

## 2012-01-05 DIAGNOSIS — E119 Type 2 diabetes mellitus without complications: Secondary | ICD-10-CM | POA: Insufficient documentation

## 2012-01-05 DIAGNOSIS — I509 Heart failure, unspecified: Secondary | ICD-10-CM | POA: Insufficient documentation

## 2012-01-05 DIAGNOSIS — E875 Hyperkalemia: Principal | ICD-10-CM | POA: Insufficient documentation

## 2012-01-05 DIAGNOSIS — N289 Disorder of kidney and ureter, unspecified: Secondary | ICD-10-CM | POA: Insufficient documentation

## 2012-01-05 DIAGNOSIS — Z87891 Personal history of nicotine dependence: Secondary | ICD-10-CM | POA: Insufficient documentation

## 2012-01-05 HISTORY — DX: Heart failure, unspecified: I50.9

## 2012-01-05 LAB — CBC
MCV: 99.4 fL (ref 78.0–100.0)
Platelets: 319 10*3/uL (ref 150–400)
RBC: 3.25 MIL/uL — ABNORMAL LOW (ref 3.87–5.11)
WBC: 11.8 10*3/uL — ABNORMAL HIGH (ref 4.0–10.5)

## 2012-01-05 LAB — COMPREHENSIVE METABOLIC PANEL
ALT: 14 U/L (ref 0–35)
AST: 14 U/L (ref 0–37)
Alkaline Phosphatase: 79 U/L (ref 39–117)
CO2: 21 mEq/L (ref 19–32)
Chloride: 98 mEq/L (ref 96–112)
GFR calc non Af Amer: 24 mL/min — ABNORMAL LOW (ref >90)
Potassium: 6 mEq/L — ABNORMAL HIGH (ref 3.5–5.1)
Sodium: 131 mEq/L — ABNORMAL LOW (ref 135–145)
Total Bilirubin: 0.1 mg/dL — ABNORMAL LOW (ref 0.3–1.2)

## 2012-01-05 MED ORDER — SODIUM POLYSTYRENE SULFONATE 15 GM/60ML PO SUSP: 15.0000 g | Freq: Once | ORAL | Status: DC

## 2012-01-05 MED ORDER — SODIUM POLYSTYRENE SULFONATE 15 GM/60ML PO SUSP
15.0000 g | Freq: Once | ORAL | Status: AC
  Administered 2012-01-06: 15 g via ORAL
  Filled 2012-01-05: qty 60

## 2012-01-05 MED ORDER — DEXTROSE 50 % IV SOLN: 1.0000 | Freq: Once | INTRAVENOUS | Status: DC

## 2012-01-05 MED ORDER — INSULIN ASPART 100 UNIT/ML IV SOLN
10.0000 [IU] | Freq: Once | INTRAVENOUS | Status: DC
  Filled 2012-01-05: qty 0.1

## 2012-01-05 MED ORDER — FUROSEMIDE 10 MG/ML IJ SOLN
40.0000 mg | Freq: Once | INTRAMUSCULAR | Status: AC
  Administered 2012-01-06: 40 mg via INTRAVENOUS
  Filled 2012-01-05: qty 4

## 2012-01-05 MED ORDER — INSULIN ASPART 100 UNIT/ML IV SOLN: 10.0000 [IU] | Freq: Once | INTRAVENOUS | Status: DC

## 2012-01-05 NOTE — ED Provider Notes (Addendum)
History     CSN: [id number]  Arrival date & time 01/05/12  1944   First MD Initiated Contact with Patient 01/05/12 2231      Chief Complaint  Patient presents with  . Abnormal Lab    (Consider location/radiation/quality/duration/timing/severity/associated sxs/prior treatment) The history is provided by the patient and medical records.   the patient was in the hospital approximately 2 weeks ago for a congestive heart failure exacerbation.  She was seen in the office for hospital followup today and was noted to be hyperkalemic and this was sent to the emergency department for evaluation.  The patient feels fine.  She has no palpitations chest pain or shortness of breath.  She's still on oxygen from the hospital but reports no new shortness of breath.  She's currently on Lasix and reports compliance with all her medications.  Currently she is without complaint.  Her potassium in the emergency department on followup a 6  Past Medical History  Diagnosis Date  . Hypertension   . Thyroid disease   . Abnormal LFTs   . Hypercholesterolemia   . Carpal tunnel syndrome   . DJD (degenerative joint disease)   . Shingles   . Meralgia paraesthetica   . GERD (gastroesophageal reflux disease)   . Pulmonary fibrosis   . Hemorrhoids   . Diverticulosis   . Normocytic normochromic anemia   . History of recurrent TIAs   . UTI (lower urinary tract infection)   . Complication of anesthesia     trouble with putting her to sleep  . CHF (congestive heart failure)     Past Surgical History  Procedure Date  . Back surgery     X 2  . Tonsillectomy   . Cataract extraction, bilateral   . Breast biopsy B/L- said to be negative for malignancy.    History reviewed. No pertinent family history.  History  Substance Use Topics  . Smoking status: Former Smoker -- 2.0 packs/day for 40 years  . Smokeless tobacco: Former Neurosurgeon    Quit date: 12/18/1990  . Alcohol Use: No    OB History    Grav Para Term  Preterm Abortions TAB SAB Ect Mult Living                  Review of Systems  All other systems reviewed and are negative.    Allergies  Penicillins; Sulfonamide derivatives; and Celebrex  Home Medications   Current Outpatient Rx  Name Route Sig Dispense Refill  . AMLODIPINE BESYLATE 5 MG PO TABS Oral Take 5 mg by mouth daily.    . ASPIRIN 325 MG PO TABS Oral Take 325 mg by mouth daily. Once a day at dinner time    . ATENOLOL 25 MG PO TABS Oral Take 25 mg by mouth 2 (two) times daily. One every morning and evening    . OCUVITE PO TABS Oral Take 1 tablet by mouth daily.    Marland Kitchen VITAMIN D 1000 UNITS PO TABS Oral Take 1,000 Units by mouth daily.    Marland Kitchen VITAMIN B-12 IJ Injection Inject 1 Syringe as directed every 30 (thirty) days. As directed    . EZETIMIBE 10 MG PO TABS Oral Take 10 mg by mouth daily.    Marland Kitchen FERROUS SULFATE 325 (65 FE) MG PO TABS Oral Take 325 mg by mouth daily with breakfast.    . FUROSEMIDE 40 MG PO TABS Oral Take 1 tablet (40 mg total) by mouth daily. 30 tablet 1  . GLIPIZIDE  ER 10 MG PO TB24 Oral Take 10 mg by mouth daily.    Marland Kitchen LANSOPRAZOLE 30 MG PO CPDR Oral Take 30 mg by mouth daily. Before a meal once a day    . LISINOPRIL 5 MG PO TABS Oral Take 5 mg by mouth daily.    Marland Kitchen METFORMIN HCL 500 MG PO TABS Oral Take 500 mg by mouth 2 (two) times daily with a meal.    . PRIMIDONE 50 MG PO TABS Oral Take 50 mg by mouth 2 (two) times daily.      BP 156/52  Pulse 58  Temp(Src) 97.8 F (36.6 C) (Oral)  Resp 17  Ht 5\' 3"  (1.6 m)  Wt 133 lb (60.328 kg)  BMI 23.56 kg/m2  SpO2 100%  Physical Exam  Nursing note and vitals reviewed. Constitutional: She is oriented to person, place, and time. She appears well-developed and well-nourished. No distress.  HENT:  Head: Normocephalic and atraumatic.  Eyes: EOM are normal.  Neck: Normal range of motion.  Cardiovascular: Normal rate, regular rhythm and normal heart sounds.   Pulmonary/Chest: Effort normal and breath sounds  normal.  Abdominal: Soft. She exhibits no distension. There is no tenderness.  Musculoskeletal: Normal range of motion.  Neurological: She is alert and oriented to person, place, and time.  Skin: Skin is warm and dry.  Psychiatric: She has a normal mood and affect. Judgment normal.    ED Course  Procedures (including critical care time)   Date: 01/05/2012  Rate: 61  Rhythm: normal sinus rhythm  QRS Axis: normal  Intervals: normal  ST/T Wave abnormalities: normal  Conduction Disutrbances: none  Narrative Interpretation:   Old EKG Reviewed: No significant changes noted     Labs Reviewed  CBC - Abnormal; Notable for the following:    WBC 11.8 (*)    RBC 3.25 (*)    Hemoglobin 10.6 (*)    HCT 32.3 (*)    All other components within normal limits  COMPREHENSIVE METABOLIC PANEL - Abnormal; Notable for the following:    Sodium 131 (*)    Potassium 6.0 (*)    Glucose, Bld 239 (*)    BUN 53 (*)    Creatinine, Ser 1.88 (*)    Total Bilirubin 0.1 (*)    GFR calc non Af Amer 24 (*)    GFR calc Af Amer 28 (*)    All other components within normal limits  PRO B NATRIURETIC PEPTIDE - Abnormal; Notable for the following:    Pro B Natriuretic peptide (BNP) 584.1 (*)    All other components within normal limits  TROPONIN I   No results found.   1. Hyperkalemia       MDM  The patient presents with a potassium of 6 without EKG changes.  It's unclear why the patient is hyperkalemic.  She has baseline renal insufficiency for her.  She'll be admitted overnight for observation on a telemetry floor.  She'll get Lasix and Kayexalate now        Lyanne Co, MD 01/05/12 0981  Lyanne Co, MD 01/05/12 [phone]

## 2012-01-05 NOTE — ED Notes (Signed)
Pt had labwork done today at Dr Kevan Ny office; office called tonight at 1900 and told to come to hospital due to critical high potassium; pt discharged from hospital two wks ago for chf; states has been feeling fine today; no complaints

## 2012-01-05 NOTE — H&P (Signed)
PCP:   Pearla Dubonnet, MD, MD   Chief Complaint:  High potassium level  HPI: 78 year old female with a history of heart failure who is hospitalized 2 weeks ago for heart failure exacerbation had a follow up with her primary care physician today Dr. Kevan Ny and was found to have a high potassium level since it emergency department. Do not have potassium level was earlier however on repeat today in the ED her potassium level is 6. She is on Lasix which was newly started 2 weeks ago. She does not take potassium supplementation. She is on ACE inhibitor that she's been on for over a year. Otherwise she's been normal state of health.  Review of Systems:  Otherwise negative  Past Medical History: Past Medical History  Diagnosis Date  . Hypertension   . Thyroid disease   . Abnormal LFTs   . Hypercholesterolemia   . Carpal tunnel syndrome   . DJD (degenerative joint disease)   . Shingles   . Meralgia paraesthetica   . GERD (gastroesophageal reflux disease)   . Pulmonary fibrosis   . Hemorrhoids   . Diverticulosis   . Normocytic normochromic anemia   . History of recurrent TIAs   . UTI (lower urinary tract infection)   . Complication of anesthesia     trouble with putting her to sleep  . CHF (congestive heart failure)    Past Surgical History  Procedure Date  . Back surgery     X 2  . Tonsillectomy   . Cataract extraction, bilateral   . Breast biopsy B/L- said to be negative for malignancy.    Medications: Prior to Admission medications   Medication Sig Start Date End Date Taking? Authorizing Provider  amLODipine (NORVASC) 5 MG tablet Take 5 mg by mouth daily.   Yes Historical Provider, MD  aspirin 325 MG tablet Take 325 mg by mouth daily. Once a day at dinner time   Yes Historical Provider, MD  atenolol (TENORMIN) 25 MG tablet Take 25 mg by mouth 2 (two) times daily. One every morning and evening   Yes Historical Provider, MD  beta carotene w/minerals (OCUVITE) tablet  Take 1 tablet by mouth daily.   Yes Historical Provider, MD  cholecalciferol (VITAMIN D) 1000 UNITS tablet Take 1,000 Units by mouth daily.   Yes Historical Provider, MD  Cyanocobalamin (VITAMIN B-12 IJ) Inject 1 Syringe as directed every 30 (thirty) days. As directed   Yes Historical Provider, MD  ezetimibe (ZETIA) 10 MG tablet Take 10 mg by mouth daily.   Yes Historical Provider, MD  ferrous sulfate 325 (65 FE) MG tablet Take 325 mg by mouth daily with breakfast.   Yes Historical Provider, MD  furosemide (LASIX) 40 MG tablet Take 1 tablet (40 mg total) by mouth daily. 12/22/11 12/21/12 Yes Pearla Dubonnet, MD  glipiZIDE (GLUCOTROL XL) 10 MG 24 hr tablet Take 10 mg by mouth daily.   Yes Historical Provider, MD  lansoprazole (PREVACID) 30 MG capsule Take 30 mg by mouth daily. Before a meal once a day   Yes Historical Provider, MD  lisinopril (PRINIVIL,ZESTRIL) 5 MG tablet Take 5 mg by mouth daily.   Yes Historical Provider, MD  metFORMIN (GLUCOPHAGE) 500 MG tablet Take 500 mg by mouth 2 (two) times daily with a meal.   Yes Historical Provider, MD  primidone (MYSOLINE) 50 MG tablet Take 50 mg by mouth 2 (two) times daily.   Yes Historical Provider, MD    Allergies:   Allergies  Allergen  Reactions  . Penicillins     REACTION: GI Upset  . Sulfonamide Derivatives   . Celebrex (Celecoxib) Rash    Social History:  reports that she has quit smoking. She quit smokeless tobacco use about 21 years ago. She reports that she does not drink alcohol or use illicit drugs.  Family History: History reviewed. No pertinent family history.  Physical Exam: Filed Vitals:   01/05/12 2001 01/05/12 2220  BP: 129/78 156/52  Pulse: 80 58  Temp: 97.7 F (36.5 C) 97.8 F (36.6 C)  TempSrc: Oral Oral  Resp: 16 17  Height: 5\' 3"  (1.6 m)   Weight: 60.328 kg (133 lb)   SpO2: 91% 100%   General appearance: alert, cooperative and no distress Neck: no carotid bruit, no JVD, supple, symmetrical, trachea  midline and thyroid not enlarged, symmetric, no tenderness/mass/nodules Lungs: clear to auscultation bilaterally Heart: regular rate and rhythm, S1, S2 normal, no murmur, click, rub or gallop Abdomen: soft, non-tender; bowel sounds normal; no masses,  no organomegaly Extremities: extremities normal, atraumatic, no cyanosis or edema Pulses: 2+ and symmetric Skin: Skin color, texture, turgor normal. No rashes or lesions Neurologic: Grossly normal    Labs on Admission:   [facility] 01/05/12 2119  NA 131*  K 6.0*  CL 98  CO2 21  GLUCOSE 239*  BUN 53*  CREATININE 1.88*  CALCIUM 9.3  MG --  PHOS --    Basename 01/05/12 2119  AST 14  ALT 14  ALKPHOS 79  BILITOT 0.1*  PROT 7.3  ALBUMIN 3.5    Basename 01/05/12 2119  WBC 11.8*  NEUTROABS --  HGB 10.6*  HCT 32.3*  MCV 99.4  PLT 319    Basename 01/05/12 2232  CKTOTAL --  CKMB --  CKMBINDEX --  TROPONINI <0.30    Radiological Exams on Admission: Dg Chest 2 View  12/20/2011  *RADIOLOGY REPORT*  Clinical Data: Shortness of breath, congestive heart failure  CHEST - 2 VIEW  Comparison: 12/19/2011; 12/18/2011; 04/25/2011; chest CT - 07/03/2008  Findings: Grossly unchanged cardiac silhouette and mediastinal contours with atherosclerotic calcifications within the thoracic aorta. Grossly unchanged bibasilar and peripheral predominant coarse reticular opacities.  No new focal airspace opacities. There is persistent blunting of bilateral costophrenic angles without definite pleural effusion.  No pneumothorax.  Grossly unchanged bones.  IMPRESSION: Grossly unchanged basilar and peripheral predominant coarse reticular opacities favored to represent pulmonary fibrosis.  No focal airspace opacities to suggest pneumonia.  No definite evidence of pulmonary edema.  Original Report Authenticated By: Waynard Reeds, M.D.   X-ray Chest Pa And Lateral   12/19/2011  *RADIOLOGY REPORT*  Clinical Data: Shortness of breath.  Hypoxia.  Congestive  heart failure.  Pulmonary edema.  CHEST - 2 VIEW  Comparison: 12/18/2011 and 04/25/2011  Findings: Interstitial markings are less prominent than on the prior study particularly posteriorly at the bases.  There is chronic mild cardiomegaly.  Pulmonary vascularity remains prominent.  No discrete effusions.  No acute osseous abnormality.  IMPRESSION: Persistent pulmonary vascular congestion.  Improved mild interstitial edema.  Original Report Authenticated By: Gwynn Burly, M.D.   Dg Chest 2 View  12/18/2011  *RADIOLOGY REPORT*  Clinical Data: Shortness of breath, chest pain.  CHEST - 2 VIEW  Comparison: 04/25/2011  Findings: Heart is borderline in size.  Diffuse interstitial prominence throughout the lungs, new since prior study.  Suspect interstitial edema.  Left base atelectasis or scarring.  No effusions.  No acute bony abnormality.  IMPRESSION: Suspect mild  edema/CHF.  Original Report Authenticated By: Cyndie Chime, M.D.   Dg Chest Portable 1 View  01/05/2012  *RADIOLOGY REPORT*  Clinical Data: Shortness of breath and weakness.  PORTABLE CHEST - 1 VIEW  Comparison: Chest x-ray 12/20/2011.  Findings: Lung volumes are normal.  There is a retrocardiac opacity that is predominately linear in appearance, similar to the prior examination, favored to represents areas of atelectasis and chronic scarring, however, sequelae of aspiration could have a similar appearance.  No other evidence to suggest consolidative airspace disease.  No definite pleural effusions.  No evidence of pulmonary edema.  Mild diffuse interstitial prominence is similar compared to the prior examinations.  Mild cardiomegaly is unchanged. Mediastinal contours are unremarkable.  Atherosclerotic calcifications within the arch of the aorta.  IMPRESSION: 1.  Retrocardiac opacity favored to represent areas of scarring and atelectasis, however, correlation for signs and symptoms of aspiration is recommended. 2.  Chronically increased interstitial  markings throughout the lungs bilaterally is again noted, which could suggest an underlying interstitial lung disease.  This could be better evaluated with non- emergent high-resolution chest CT if clinically indicated. 3.  Atherosclerosis.  Original Report Authenticated By: Florencia Reasons, M.D.   Mm Digital Screening  12/15/2011  DG SCREEN MAMMOGRAM BILATERAL Bilateral CC and MLO view(s) were taken.  DIGITAL SCREENING MAMMOGRAM WITH CAD: The breast tissue is heterogeneously dense.  No masses or malignant type calcifications are  identified.  Compared with prior studies.  Images were processed with CAD.  IMPRESSION: No specific mammographic evidence of malignancy.  Next screening mammogram is recommended in one  year.  A result letter of this screening mammogram will be mailed directly to the patient.  ASSESSMENT: Benign - BI-RADS 2  Screening mammogram in 1 year. ,    Assessment/Plan Present on Admission:  78 year old female with hyperkalemia  .Hyperkalemia .Renal insufficiency .DIABETES, TYPE 2 .CHF (congestive heart failure)  EKG shows no peaked T waves. We'll observe overnight place on telemetry. Provide her with some Kayexalate. Hold Her ACE inhibitor at this time. Repeat potassium level in the morning. Patient's PCP is Dr. Kevan Ny will be taking over her care. In the morning. Georgetta Crafton [phone] 01/05/2012, 11:44 PM Patient seen before midnight

## 2012-01-06 LAB — BASIC METABOLIC PANEL
BUN: 49 mg/dL — ABNORMAL HIGH (ref 6–23)
Chloride: 100 mEq/L (ref 96–112)
GFR calc Af Amer: 31 mL/min — ABNORMAL LOW (ref >90)
GFR calc non Af Amer: 26 mL/min — ABNORMAL LOW (ref >90)
Potassium: 5.3 mEq/L — ABNORMAL HIGH (ref 3.5–5.1)
Sodium: 134 mEq/L — ABNORMAL LOW (ref 135–145)

## 2012-01-06 MED ORDER — ASPIRIN 325 MG PO TABS
325.0000 mg | ORAL_TABLET | Freq: Every day | ORAL | Status: DC
  Filled 2012-01-06: qty 1

## 2012-01-06 MED ORDER — FUROSEMIDE 40 MG PO TABS
40.0000 mg | ORAL_TABLET | Freq: Every day | ORAL | Status: DC
  Filled 2012-01-06: qty 1

## 2012-01-06 MED ORDER — FERROUS SULFATE 325 (65 FE) MG PO TABS
325.0000 mg | ORAL_TABLET | Freq: Every day | ORAL | Status: DC
  Administered 2012-01-06: 325 mg via ORAL
  Filled 2012-01-06 (×2): qty 1

## 2012-01-06 MED ORDER — SODIUM POLYSTYRENE SULFONATE 15 GM/60ML PO SUSP
30.0000 g | Freq: Two times a day (BID) | ORAL | Status: DC
  Administered 2012-01-06: 30 g via ORAL
  Filled 2012-01-06 (×3): qty 120

## 2012-01-06 MED ORDER — SODIUM CHLORIDE 0.9 % IJ SOLN
3.0000 mL | INTRAMUSCULAR | Status: DC | PRN
  Administered 2012-01-06: 3 mL via INTRAVENOUS

## 2012-01-06 MED ORDER — EZETIMIBE 10 MG PO TABS
10.0000 mg | ORAL_TABLET | Freq: Every day | ORAL | Status: DC
  Filled 2012-01-06: qty 1

## 2012-01-06 MED ORDER — PRIMIDONE 50 MG PO TABS
50.0000 mg | ORAL_TABLET | Freq: Two times a day (BID) | ORAL | Status: DC
  Filled 2012-01-06 (×2): qty 1

## 2012-01-06 MED ORDER — AMLODIPINE BESYLATE 5 MG PO TABS
5.0000 mg | ORAL_TABLET | Freq: Every day | ORAL | Status: DC
  Filled 2012-01-06: qty 1

## 2012-01-06 MED ORDER — ATENOLOL 25 MG PO TABS
25.0000 mg | ORAL_TABLET | Freq: Two times a day (BID) | ORAL | Status: DC
  Filled 2012-01-06 (×2): qty 1

## 2012-01-06 MED ORDER — SODIUM CHLORIDE 0.9 % IJ SOLN: 3.0000 mL | Freq: Two times a day (BID) | INTRAMUSCULAR | Status: DC

## 2012-01-06 MED ORDER — GLIPIZIDE ER 10 MG PO TB24
10.0000 mg | ORAL_TABLET | Freq: Every day | ORAL | Status: DC
  Filled 2012-01-06: qty 1

## 2012-01-06 MED ORDER — SODIUM CHLORIDE 0.9 % IV SOLN: 250.0000 mL | INTRAVENOUS | Status: DC | PRN

## 2012-01-06 MED ORDER — OCUVITE PO TABS
1.0000 | ORAL_TABLET | Freq: Every day | ORAL | Status: DC
  Filled 2012-01-06: qty 1

## 2012-01-06 MED ORDER — VITAMIN D3 25 MCG (1000 UNIT) PO TABS
1000.0000 [IU] | ORAL_TABLET | Freq: Every day | ORAL | Status: DC
  Filled 2012-01-06: qty 1

## 2012-01-06 NOTE — Discharge Summary (Signed)
Physician Discharge Summary  NAME:Kamrie CHARIS JULIANA  ZOX:[id number]  DOB: 1932/03/22   Admit date: 01/05/2012 Discharge date: 01/06/2012  Discharge Diagnoses:  Principal Problem:  Hyperkalemia - resolved.  ACE inhibitor discontinued Active Problems:  DIABETES, TYPE 2 - stable  Renal insufficiency - stable  CHF (congestive heart failure) - stable   Discharge Condition: Improved  Hospital Course: 78 year old female with new diagnosis of CHF 2 weeks ago was discharged home on lisinopril 5 milligrams daily, new medication.  Has not been taking some A.  On no potassium supplementation.  She is on Lasix.  Was elevated in the office at 6.5.  Plans were to discontinue lisinopril and check potassium in several days.  On call physician notified of potassium and patient was sent to the emergency room and admitted and received Kayexalate x2.  Last Kayexalate was given for potassium of 5.3.  Next potassium should be well within normal range.  Otherwise feels well.  She is oxygen dependent secondary to pulmonary fibrosis.  We'll plan to send home off lisinopril and recheck potassium in several days   Filed Vitals:   01/06/12 0100 01/06/12 0205 01/06/12 0223 01/06/12 0459  BP: 136/47  151/73 134/75  Pulse: 59 61 61 59  Temp:   97.6 F (36.4 C) 97.9 F (36.6 C)  TempSrc:   Oral Oral  Resp: 20 20 19 19   Height:   5' 2.5" (1.588 m)   Weight:   61.8 kg (136 lb 3.9 oz)   SpO2: 100% 100% 97% 99%    General appearance: alert, cooperative and no distress  Neck: no carotid bruit, no JVD, supple, symmetrical, trachea midline and thyroid not enlarged, symmetric, no tenderness/mass/nodules  Lungs: clear to auscultation bilaterally  Heart: regular rate and rhythm, S1, S2 normal, no murmur, click, rub or gallop  Abdomen: soft, non-tender; bowel sounds normal; no masses, no organomegaly  Extremities: extremities normal, atraumatic, no cyanosis or edema  Pulses: 2+ and symmetric  Skin: Skin color, texture, turgor  normal. No rashes or lesions  Neurologic: Grossly normal  Consults: Noncontributory  Disposition: 01-Home or Self Care   Medication List  As of 01/06/2012  8:09 AM   ASK your doctor about these medications         amLODipine 5 MG tablet   Commonly known as: NORVASC   Take 5 mg by mouth daily.      aspirin 325 MG tablet   Take 325 mg by mouth daily. Once a day at dinner time      atenolol 25 MG tablet   Commonly known as: TENORMIN   Take 25 mg by mouth 2 (two) times daily. One every morning and evening      beta carotene w/minerals tablet   Take 1 tablet by mouth daily.      cholecalciferol 1000 UNITS tablet   Commonly known as: VITAMIN D   Take 1,000 Units by mouth daily.      ezetimibe 10 MG tablet   Commonly known as: ZETIA   Take 10 mg by mouth daily.      ferrous sulfate 325 (65 FE) MG tablet   Take 325 mg by mouth daily with breakfast.      furosemide 40 MG tablet   Commonly known as: LASIX   Take 1 tablet (40 mg total) by mouth daily.      glipiZIDE 10 MG 24 hr tablet   Commonly known as: GLUCOTROL XL   Take 10 mg by mouth daily.  lansoprazole 30 MG capsule   Commonly known as: PREVACID   Take 30 mg by mouth daily. Before a meal once a day          Discontinue lisinopril          metFORMIN 500 MG tablet   Commonly known as: GLUCOPHAGE   Take 500 mg by mouth 2 (two) times daily with a meal.      primidone 50 MG tablet   Commonly known as: MYSOLINE   Take 50 mg by mouth 2 (two) times daily.      VITAMIN B-12 IJ   Inject 1 Syringe as directed every 30 (thirty) days. As directed           Follow-up Information    Follow up with Parys Elenbaas NEVILL, MD.   Contact information:   [address] [place] [address] [phone]          Things to follow up in the outpatient setting: Need to followup on potassium level in 3 days, Friday, September 7  Time coordinating discharge: 20 minutes   The results of  significant diagnostics from this hospitalization (including imaging, microbiology, ancillary and laboratory) are listed below for reference.    Significant Diagnostic Studies: Dg Chest 2 View  12/20/2011  *RADIOLOGY REPORT*  Clinical Data: Shortness of breath, congestive heart failure  CHEST - 2 VIEW  Comparison: 12/19/2011; 12/18/2011; 04/25/2011; chest CT - 07/03/2008  Findings: Grossly unchanged cardiac silhouette and mediastinal contours with atherosclerotic calcifications within the thoracic aorta. Grossly unchanged bibasilar and peripheral predominant coarse reticular opacities.  No new focal airspace opacities. There is persistent blunting of bilateral costophrenic angles without definite pleural effusion.  No pneumothorax.  Grossly unchanged bones.  IMPRESSION: Grossly unchanged basilar and peripheral predominant coarse reticular opacities favored to represent pulmonary fibrosis.  No focal airspace opacities to suggest pneumonia.  No definite evidence of pulmonary edema.  Original Report Authenticated By: Waynard Reeds, M.D.   X-ray Chest Pa And Lateral   12/19/2011  *RADIOLOGY REPORT*  Clinical Data: Shortness of breath.  Hypoxia.  Congestive heart failure.  Pulmonary edema.  CHEST - 2 VIEW  Comparison: 12/18/2011 and 04/25/2011  Findings: Interstitial markings are less prominent than on the prior study particularly posteriorly at the bases.  There is chronic mild cardiomegaly.  Pulmonary vascularity remains prominent.  No discrete effusions.  No acute osseous abnormality.  IMPRESSION: Persistent pulmonary vascular congestion.  Improved mild interstitial edema.  Original Report Authenticated By: Gwynn Burly, M.D.   Dg Chest 2 View  12/18/2011  *RADIOLOGY REPORT*  Clinical Data: Shortness of breath, chest pain.  CHEST - 2 VIEW  Comparison: 04/25/2011  Findings: Heart is borderline in size.  Diffuse interstitial prominence throughout the lungs, new since prior study.  Suspect interstitial  edema.  Left base atelectasis or scarring.  No effusions.  No acute bony abnormality.  IMPRESSION: Suspect mild edema/CHF.  Original Report Authenticated By: Cyndie Chime, M.D.   Dg Chest Portable 1 View  01/05/2012  *RADIOLOGY REPORT*  Clinical Data: Shortness of breath and weakness.  PORTABLE CHEST - 1 VIEW  Comparison: Chest x-ray 12/20/2011.  Findings: Lung volumes are normal.  There is a retrocardiac opacity that is predominately linear in appearance, similar to the prior examination, favored to represents areas of atelectasis and chronic scarring, however, sequelae of aspiration could have a similar appearance.  No other evidence to suggest consolidative airspace disease.  No definite pleural effusions.  No  evidence of pulmonary edema.  Mild diffuse interstitial prominence is similar compared to the prior examinations.  Mild cardiomegaly is unchanged. Mediastinal contours are unremarkable.  Atherosclerotic calcifications within the arch of the aorta.  IMPRESSION: 1.  Retrocardiac opacity favored to represent areas of scarring and atelectasis, however, correlation for signs and symptoms of aspiration is recommended. 2.  Chronically increased interstitial markings throughout the lungs bilaterally is again noted, which could suggest an underlying interstitial lung disease.  This could be better evaluated with non- emergent high-resolution chest CT if clinically indicated. 3.  Atherosclerosis.  Original Report Authenticated By: Florencia Reasons, M.D.   Mm Digital Screening  12/15/2011  DG SCREEN MAMMOGRAM BILATERAL Bilateral CC and MLO view(s) were taken.  DIGITAL SCREENING MAMMOGRAM WITH CAD: The breast tissue is heterogeneously dense.  No masses or malignant type calcifications are  identified.  Compared with prior studies.  Images were processed with CAD.  IMPRESSION: No specific mammographic evidence of malignancy.  Next screening mammogram is recommended in one  year.  A result letter of this  screening mammogram will be mailed directly to the patient.  ASSESSMENT: Benign - BI-RADS 2  Screening mammogram in 1 year. ,    Microbiology: No results found for this or any previous visit (from the past 240 hour(s)).   Labs: Results for orders placed during the hospital encounter of 01/05/12  CBC      Component Value Range   WBC 11.8 (*) 4.0 - 10.5 (K/uL)   RBC 3.25 (*) 3.87 - 5.11 (MIL/uL)   Hemoglobin 10.6 (*) 12.0 - 15.0 (g/dL)   HCT 16.1 (*) 09.6 - 46.0 (%)   MCV 99.4  78.0 - 100.0 (fL)   MCH 32.6  26.0 - 34.0 (pg)   MCHC 32.8  30.0 - 36.0 (g/dL)   RDW 04.5  40.9 - 81.1 (%)   Platelets 319  150 - 400 (K/uL)  COMPREHENSIVE METABOLIC PANEL      Component Value Range   Sodium 131 (*) 135 - 145 (mEq/L)   Potassium 6.0 (*) 3.5 - 5.1 (mEq/L)   Chloride 98  96 - 112 (mEq/L)   CO2 21  19 - 32 (mEq/L)   Glucose, Bld 239 (*) 70 - 99 (mg/dL)   BUN 53 (*) 6 - 23 (mg/dL)   Creatinine, Ser 9.14 (*) 0.50 - 1.10 (mg/dL)   Calcium 9.3  8.4 - 78.2 (mg/dL)   Total Protein 7.3  6.0 - 8.3 (g/dL)   Albumin 3.5  3.5 - 5.2 (g/dL)   AST 14  0 - 37 (U/L)   ALT 14  0 - 35 (U/L)   Alkaline Phosphatase 79  39 - 117 (U/L)   Total Bilirubin 0.1 (*) 0.3 - 1.2 (mg/dL)   GFR calc non Af Amer 24 (*) >90 (mL/min)   GFR calc Af Amer 28 (*) >90 (mL/min)  TROPONIN I      Component Value Range   Troponin I <0.30  <0.30 (ng/mL)  PRO B NATRIURETIC PEPTIDE      Component Value Range   Pro B Natriuretic peptide (BNP) 584.1 (*) 0 - 450 (pg/mL)  BASIC METABOLIC PANEL      Component Value Range   Sodium 134 (*) 135 - 145 (mEq/L)   Potassium 5.3 (*) 3.5 - 5.1 (mEq/L)   Chloride 100  96 - 112 (mEq/L)   CO2 23  19 - 32 (mEq/L)   Glucose, Bld 99  70 - 99 (mg/dL)   BUN 49 (*) 6 -  23 (mg/dL)   Creatinine, Ser 4.09 (*) 0.50 - 1.10 (mg/dL)   Calcium 9.3  8.4 - 81.1 (mg/dL)   GFR calc non Af Amer 26 (*) >90 (mL/min)   GFR calc Af Amer 31 (*) >90 (mL/min)     Signed: Heaven Meeker NEVILL 01/06/2012, 8:09 AM

## 2012-01-06 NOTE — ED Notes (Signed)
Pt reports being referred to ED by PCP for high potassium level - pt states she was at her Dr office for routine follow up visit s/p hospital admission for CHF. Pt in no acute distress on assessment, A&Ox4.

## 2012-01-06 NOTE — Care Management Note (Signed)
    Page 1 of 1   01/06/2012     12:22:12 PM   CARE MANAGEMENT NOTE 01/06/2012  Patient:  Susan Mccoy, Susan Mccoy   Account Number:  [id number]  Date Initiated:  01/06/2012  Documentation initiated by:  Lanier Clam  Subjective/Objective Assessment:   ADMITTED W/HYPERKALEMIA     Action/Plan:   FROM HOME   Anticipated DC Date:  01/06/2012   Anticipated DC Plan:  HOME/SELF CARE      DC Planning Services  CM consult      Choice offered to / List presented to:             Status of service:  Completed, signed off Medicare Important Message given?   (If response is "NO", the following Medicare IM given date fields will be blank) Date Medicare IM given:   Date Additional Medicare IM given:    Discharge Disposition:  HOME/SELF CARE  Per UR Regulation:  Reviewed for med. necessity/level of care/duration of stay  If discussed at Long Length of Stay Meetings, dates discussed:    Comments:

## 2012-06-30 ENCOUNTER — Encounter (INDEPENDENT_AMBULATORY_CARE_PROVIDER_SITE_OTHER): Payer: Medicare Other | Admitting: Ophthalmology

## 2012-06-30 DIAGNOSIS — E11319 Type 2 diabetes mellitus with unspecified diabetic retinopathy without macular edema: Secondary | ICD-10-CM

## 2012-06-30 DIAGNOSIS — H3530 Unspecified macular degeneration: Secondary | ICD-10-CM

## 2012-06-30 DIAGNOSIS — H35039 Hypertensive retinopathy, unspecified eye: Secondary | ICD-10-CM

## 2012-06-30 DIAGNOSIS — E1139 Type 2 diabetes mellitus with other diabetic ophthalmic complication: Secondary | ICD-10-CM

## 2012-06-30 DIAGNOSIS — H43819 Vitreous degeneration, unspecified eye: Secondary | ICD-10-CM

## 2012-06-30 DIAGNOSIS — I10 Essential (primary) hypertension: Secondary | ICD-10-CM

## 2013-04-10 ENCOUNTER — Emergency Department (HOSPITAL_COMMUNITY)
Admission: EM | Admit: 2013-04-10 | Discharge: 2013-04-10 | Disposition: A | Discharge status: Home / Self Care | Payer: Medicare Other | Attending: Emergency Medicine | Admitting: Emergency Medicine

## 2013-04-10 ENCOUNTER — Encounter (HOSPITAL_COMMUNITY): Payer: Self-pay

## 2013-04-10 ENCOUNTER — Emergency Department (HOSPITAL_COMMUNITY): Payer: Medicare Other

## 2013-04-10 DIAGNOSIS — Y9389 Activity, other specified: Secondary | ICD-10-CM | POA: Insufficient documentation

## 2013-04-10 DIAGNOSIS — I509 Heart failure, unspecified: Secondary | ICD-10-CM | POA: Insufficient documentation

## 2013-04-10 DIAGNOSIS — Z8619 Personal history of other infectious and parasitic diseases: Secondary | ICD-10-CM | POA: Insufficient documentation

## 2013-04-10 DIAGNOSIS — Z8639 Personal history of other endocrine, nutritional and metabolic disease: Secondary | ICD-10-CM | POA: Insufficient documentation

## 2013-04-10 DIAGNOSIS — K219 Gastro-esophageal reflux disease without esophagitis: Secondary | ICD-10-CM | POA: Insufficient documentation

## 2013-04-10 DIAGNOSIS — Z7982 Long term (current) use of aspirin: Secondary | ICD-10-CM | POA: Insufficient documentation

## 2013-04-10 DIAGNOSIS — IMO0002 Reserved for concepts with insufficient information to code with codable children: Secondary | ICD-10-CM

## 2013-04-10 DIAGNOSIS — S139XXA Sprain of joints and ligaments of unspecified parts of neck, initial encounter: Secondary | ICD-10-CM | POA: Insufficient documentation

## 2013-04-10 DIAGNOSIS — I10 Essential (primary) hypertension: Secondary | ICD-10-CM | POA: Insufficient documentation

## 2013-04-10 DIAGNOSIS — Z8709 Personal history of other diseases of the respiratory system: Secondary | ICD-10-CM | POA: Insufficient documentation

## 2013-04-10 DIAGNOSIS — S161XXA Strain of muscle, fascia and tendon at neck level, initial encounter: Secondary | ICD-10-CM

## 2013-04-10 DIAGNOSIS — Z880 Allergy status to penicillin: Secondary | ICD-10-CM | POA: Insufficient documentation

## 2013-04-10 DIAGNOSIS — S61509A Unspecified open wound of unspecified wrist, initial encounter: Secondary | ICD-10-CM | POA: Insufficient documentation

## 2013-04-10 DIAGNOSIS — S0990XA Unspecified injury of head, initial encounter: Secondary | ICD-10-CM | POA: Insufficient documentation

## 2013-04-10 DIAGNOSIS — Z8739 Personal history of other diseases of the musculoskeletal system and connective tissue: Secondary | ICD-10-CM | POA: Insufficient documentation

## 2013-04-10 DIAGNOSIS — Z862 Personal history of diseases of the blood and blood-forming organs and certain disorders involving the immune mechanism: Secondary | ICD-10-CM | POA: Insufficient documentation

## 2013-04-10 DIAGNOSIS — Y929 Unspecified place or not applicable: Secondary | ICD-10-CM | POA: Insufficient documentation

## 2013-04-10 DIAGNOSIS — Z87891 Personal history of nicotine dependence: Secondary | ICD-10-CM | POA: Insufficient documentation

## 2013-04-10 DIAGNOSIS — W1809XA Striking against other object with subsequent fall, initial encounter: Secondary | ICD-10-CM | POA: Insufficient documentation

## 2013-04-10 DIAGNOSIS — Z79899 Other long term (current) drug therapy: Secondary | ICD-10-CM | POA: Insufficient documentation

## 2013-04-10 DIAGNOSIS — Z8669 Personal history of other diseases of the nervous system and sense organs: Secondary | ICD-10-CM | POA: Insufficient documentation

## 2013-04-10 DIAGNOSIS — Z8673 Personal history of transient ischemic attack (TIA), and cerebral infarction without residual deficits: Secondary | ICD-10-CM | POA: Insufficient documentation

## 2013-04-10 DIAGNOSIS — Z87440 Personal history of urinary (tract) infections: Secondary | ICD-10-CM | POA: Insufficient documentation

## 2013-04-10 NOTE — ED Notes (Signed)
Patient transported to X-ray 

## 2013-04-10 NOTE — ED Notes (Signed)
Assisted with sitting on side of bed, dangled for 5 minutes. States is sore.

## 2013-04-10 NOTE — ED Provider Notes (Signed)
CSN: [id number]     Arrival date & time 04/10/13  0759 History   First MD Initiated Contact with Patient 04/10/13 [phone]     Chief Complaint  Patient presents with  . Fall  . Neck Pain   (Consider location/radiation/quality/duration/timing/severity/associated sxs/prior Treatment) HPI Comments: Patient presents to the ER for evaluation after fall. Patient reports that she got up out of bed to go to the bathroom when she fell. She reports that she was reaching to turn on the light, lost her balance and fell, hitting the back of her head on a nightstand. There was no loss of consciousness. Patient complains of headache as well as pain across her shoulder blades and neck area. She is not experiencing chest pain, shortness of breath, abdominal pain, lower extremity pain. She has a tear on her left forearm, but denies wrist and elbow pain. Patient is back for his dizziness or vision change. There was no chest pain, palpitations or symptoms prior to the fall, she reports simply losing her balance.  Patient is a 79 y.o. female presenting with fall and neck pain.  Fall Associated symptoms include headaches.  Neck Pain Associated symptoms: headaches     Past Medical History  Diagnosis Date  . Hypertension   . Thyroid disease   . Abnormal LFTs   . Hypercholesterolemia   . Carpal tunnel syndrome   . DJD (degenerative joint disease)   . Shingles   . Meralgia paraesthetica   . GERD (gastroesophageal reflux disease)   . Pulmonary fibrosis   . Hemorrhoids   . Diverticulosis   . Normocytic normochromic anemia   . History of recurrent TIAs   . UTI (lower urinary tract infection)   . Complication of anesthesia     trouble with putting her to sleep  . CHF (congestive heart failure)    Past Surgical History  Procedure Laterality Date  . Back surgery      X 2  . Tonsillectomy    . Cataract extraction, bilateral    . Breast biopsy  B/L- said to be negative for malignancy.   History reviewed. No  pertinent family history. History  Substance Use Topics  . Smoking status: Former Smoker -- 2.00 packs/day for 40 years  . Smokeless tobacco: Former Neurosurgeon    Quit date: 12/18/1990  . Alcohol Use: No   OB History   Grav Para Term Preterm Abortions TAB SAB Ect Mult Living                 Review of Systems  HENT: Positive for neck pain.   Skin: Positive for wound.  Neurological: Positive for headaches.  All other systems reviewed and are negative.    Allergies  Penicillins; Sulfonamide derivatives; and Celebrex  Home Medications   Current Outpatient Rx  Name  Route  Sig  Dispense  Refill  . allopurinol (ZYLOPRIM) 150 mg TABS tablet   Oral   Take 150 mg by mouth daily.         Marland Kitchen amLODipine (NORVASC) 5 MG tablet   Oral   Take 5 mg by mouth daily.         Marland Kitchen aspirin 325 MG tablet   Oral   Take 325 mg by mouth every evening.          Marland Kitchen atenolol (TENORMIN) 25 MG tablet   Oral   Take 25 mg by mouth daily.          . beta carotene w/minerals (OCUVITE) tablet  Oral   Take 1 tablet by mouth daily.         . cholecalciferol (VITAMIN D) 1000 UNITS tablet   Oral   Take 1,000 Units by mouth daily.         . Cyanocobalamin (VITAMIN B-12 IJ)   Injection   Inject 1 Syringe as directed every 30 (thirty) days. As directed         . cyclobenzaprine (FLEXERIL) 10 MG tablet   Oral   Take 5 mg by mouth at bedtime.         . ferrous sulfate 325 (65 FE) MG tablet   Oral   Take 325 mg by mouth daily with breakfast.         . furosemide (LASIX) 40 MG tablet   Oral   Take 40 mg by mouth 2 (two) times daily.         Marland Kitchen glipiZIDE (GLUCOTROL XL) 10 MG 24 hr tablet   Oral   Take 10 mg by mouth daily.         . lansoprazole (PREVACID) 30 MG capsule   Oral   Take 30 mg by mouth daily. Before a meal once a day         . primidone (MYSOLINE) 50 MG tablet   Oral   Take 50 mg by mouth 2 (two) times daily.          BP 178/70  Temp(Src) 97.8 F (36.6  C) (Oral)  Resp 18  SpO2 100% Physical Exam  Constitutional: She is oriented to person, place, and time. She appears well-developed and well-nourished. No distress.  HENT:  Head: Normocephalic and atraumatic.  Right Ear: Hearing normal.  Left Ear: Hearing normal.  Nose: Nose normal.  Mouth/Throat: Oropharynx is clear and moist and mucous membranes are normal.  Eyes: Conjunctivae and EOM are normal. Pupils are equal, round, and reactive to light.  Neck: Normal range of motion. Neck supple. Muscular tenderness present. No spinous process tenderness present.  Cardiovascular: Regular rhythm, S1 normal and S2 normal.  Exam reveals no gallop and no friction rub.   No murmur heard. Pulmonary/Chest: Effort normal and breath sounds normal. No respiratory distress. She exhibits no tenderness.  Abdominal: Soft. Normal appearance and bowel sounds are normal. There is no hepatosplenomegaly. There is no tenderness. There is no rebound, no guarding, no tenderness at McBurney's point and negative Murphy's sign. No hernia.  Musculoskeletal: Normal range of motion.       Left elbow: Normal.       Left wrist: Normal.       Right hip: Normal.       Left hip: Normal.       Thoracic back: Normal.       Lumbar back: Normal.  Neurological: She is alert and oriented to person, place, and time. She has normal strength. No cranial nerve deficit or sensory deficit. Coordination normal. GCS eye subscore is 4. GCS verbal subscore is 5. GCS motor subscore is 6.  Skin: Skin is warm, dry and intact. No rash noted. No cyanosis.     Psychiatric: She has a normal mood and affect. Her speech is normal and behavior is normal. Thought content normal.    ED Course  Procedures (including critical care time)  EKG:  Date: 04/10/2013  Rate: 80  Rhythm: normal sinus rhythm  QRS Axis: normal  Intervals: normal  ST/T Wave abnormalities: normal  Conduction Disutrbances: none  Narrative Interpretation:  unremarkable     Labs  Review Labs Reviewed - No data to display Imaging Review Ct Head Wo Contrast  04/10/2013   *RADIOLOGY REPORT*  Clinical Data:  Fall with neck pain.  CT HEAD WITHOUT CONTRAST CT CERVICAL SPINE WITHOUT CONTRAST  Technique:  Multidetector CT imaging of the head and cervical spine was performed following the standard protocol without intravenous contrast.  Multiplanar CT image reconstructions of the cervical spine were also generated.  Comparison:  11/29/2009  CT HEAD  Findings: Ventricles, cisterns and others CSF spaces are within normal.  There is mild chronic ischemic microvascular disease present.  There is no mass, mass effect, shift midline structures or acute hemorrhage.  There is no evidence of acute infarction. Remaining bones and soft tissues are within normal.  IMPRESSION: No acute intracranial findings.  Chronic ischemic microvascular disease.  CT CERVICAL SPINE  Findings: Vertebral body alignment and heights are normal.  There is mild spondylosis throughout the cervical spine.  Disc space narrowing is present from the C2-3 level to the C6-7 level. Prevertebral soft tissues and the atlantoaxial articulation are within normal.  There is uncovertebral joint spurring and mild facet arthropathy.  There is bilateral neural foraminal narrowing at several levels due to adjacent bony spurring.  There is no acute fracture or subluxation.  Remainder of the exam is unremarkable.  IMPRESSION: No acute findings.  Spondylosis with multilevel degenerative disc disease and neural foraminal narrowing.   Original Report Authenticated By: Elberta Fortis, M.D.   Ct Cervical Spine Wo Contrast  04/10/2013   *RADIOLOGY REPORT*  Clinical Data:  Fall with neck pain.  CT HEAD WITHOUT CONTRAST CT CERVICAL SPINE WITHOUT CONTRAST  Technique:  Multidetector CT imaging of the head and cervical spine was performed following the standard protocol without intravenous contrast.  Multiplanar CT image  reconstructions of the cervical spine were also generated.  Comparison:  11/29/2009  CT HEAD  Findings: Ventricles, cisterns and others CSF spaces are within normal.  There is mild chronic ischemic microvascular disease present.  There is no mass, mass effect, shift midline structures or acute hemorrhage.  There is no evidence of acute infarction. Remaining bones and soft tissues are within normal.  IMPRESSION: No acute intracranial findings.  Chronic ischemic microvascular disease.  CT CERVICAL SPINE  Findings: Vertebral body alignment and heights are normal.  There is mild spondylosis throughout the cervical spine.  Disc space narrowing is present from the C2-3 level to the C6-7 level. Prevertebral soft tissues and the atlantoaxial articulation are within normal.  There is uncovertebral joint spurring and mild facet arthropathy.  There is bilateral neural foraminal narrowing at several levels due to adjacent bony spurring.  There is no acute fracture or subluxation.  Remainder of the exam is unremarkable.  IMPRESSION: No acute findings.  Spondylosis with multilevel degenerative disc disease and neural foraminal narrowing.   Original Report Authenticated By: Elberta Fortis, M.D.    MDM  Diagnosis: 1. Cervical strain 2. Minor head injury 2. Skin tear, left arm  Patient presents to the ER after a mechanical fall. She hit her head on a nightstand, but there was no loss of consciousness. Neurologic exam is unremarkable. CT head did not show any intracranial injury. CT cervical spine was performed because of neck pain, it was negative. The pain seemed to be more musculoskeletal in nature, but the mechanism required imaging.  Skin tear provided local wound care, watch for signs of infection.    Gilda Crease, MD 04/10/13 [phone]

## 2013-04-10 NOTE — ED Notes (Signed)
Dr. Pollina at the bedside.  

## 2013-04-10 NOTE — ED Notes (Signed)
Per [facility], pt fell after standing up to get oob this morning. Reports neck pain with palpation and does have a C-collar in place by EMS.. A/O x 4. 20g to LAC. CBG 170. BP 173/90.

## 2013-07-04 ENCOUNTER — Ambulatory Visit (INDEPENDENT_AMBULATORY_CARE_PROVIDER_SITE_OTHER): Payer: Medicare Other | Admitting: Ophthalmology

## 2013-07-25 ENCOUNTER — Ambulatory Visit (INDEPENDENT_AMBULATORY_CARE_PROVIDER_SITE_OTHER): Payer: Medicare Other | Admitting: Podiatry

## 2013-07-25 ENCOUNTER — Encounter: Payer: Self-pay | Admitting: Podiatry

## 2013-07-25 VITALS — BP 135/72 | HR 72 | Resp 16 | Ht 62.0 in | Wt 128.0 lb

## 2013-07-25 DIAGNOSIS — B351 Tinea unguium: Secondary | ICD-10-CM

## 2013-07-25 DIAGNOSIS — M79609 Pain in unspecified limb: Secondary | ICD-10-CM

## 2013-07-25 NOTE — Progress Notes (Signed)
Patient ID: Susan Mccoy, female   DOB: 19-Nov-1931, 79 y.o.   MRN: [id number] Subjective: Anxious, orientated x63 79 year old diabetic female presents with friend requesting debridement of toenails. Her last visit in our office was 07/05/2012  Objective:  Vascular: DP and PT pulses are 1/4 bilaterally. Neurological: Sensation intact bilaterally Dermatological: Atrophic skin noted bilaterally. Incurvated, brittle, discolored toenails x10 Musculoskeletal: HAV deformity right noted  Assessment:  Onychomycoses with symptoms  Plan: Nails x10 debrided back without any bleeding. Return intervals recommended a 3 months.

## 2013-07-25 NOTE — Patient Instructions (Signed)
Diabetes and Foot Care Diabetes may cause you to have problems because of poor blood supply (circulation) to your feet and legs. This may cause the skin on your feet to become thinner, break easier, and heal more slowly. Your skin may become dry, and the skin may peel and crack. You may also have nerve damage in your legs and feet causing decreased feeling in them. You may not notice minor injuries to your feet that could lead to infections or more serious problems. Taking care of your feet is one of the most important things you can do for yourself.  HOME CARE INSTRUCTIONS  Wear shoes at all times, even in the house. Do not go barefoot. Bare feet are easily injured.  Check your feet daily for blisters, cuts, and redness. If you cannot see the bottom of your feet, use a mirror or ask someone for help.  Wash your feet with warm water (do not use hot water) and mild soap. Then pat your feet and the areas between your toes until they are completely dry. Do not soak your feet as this can dry your skin.  Apply a moisturizing lotion or petroleum jelly (that does not contain alcohol and is unscented) to the skin on your feet and to dry, brittle toenails. Do not apply lotion between your toes.  Trim your toenails straight across. Do not dig under them or around the cuticle. File the edges of your nails with an emery board or nail file.  Do not cut corns or calluses or try to remove them with medicine.  Wear clean socks or stockings every day. Make sure they are not too tight. Do not wear knee-high stockings since they may decrease blood flow to your legs.  Wear shoes that fit properly and have enough cushioning. To break in new shoes, wear them for just a few hours a day. This prevents you from injuring your feet. Always look in your shoes before you put them on to be sure there are no objects inside.  Do not cross your legs. This may decrease the blood flow to your feet.  If you find a minor scrape,  cut, or break in the skin on your feet, keep it and the skin around it clean and dry. These areas may be cleansed with mild soap and water. Do not cleanse the area with peroxide, alcohol, or iodine.  When you remove an adhesive bandage, be sure not to damage the skin around it.  If you have a wound, look at it several times a day to make sure it is healing.  Do not use heating pads or hot water bottles. They may burn your skin. If you have lost feeling in your feet or legs, you may not know it is happening until it is too late.  Make sure your health care provider performs a complete foot exam at least annually or more often if you have foot problems. Report any cuts, sores, or bruises to your health care provider immediately. SEEK MEDICAL CARE IF:   You have an injury that is not healing.  You have cuts or breaks in the skin.  You have an ingrown nail.  You notice redness on your legs or feet.  You feel burning or tingling in your legs or feet.  You have pain or cramps in your legs and feet.  Your legs or feet are numb.  Your feet always feel cold. SEEK IMMEDIATE MEDICAL CARE IF:   There is increasing redness,   swelling, or pain in or around a wound.  There is a red line that goes up your leg.  Pus is coming from a wound.  You develop a fever or as directed by your health care provider.  You notice a bad smell coming from an ulcer or wound. Document Released: 07/18/2000 Document Revised: 03/23/2013 Document Reviewed: 12/28/2012 [employer] Patient Information 2014 [employer].  

## 2013-08-07 ENCOUNTER — Encounter (HOSPITAL_COMMUNITY): Payer: Self-pay | Admitting: Emergency Medicine

## 2013-08-07 ENCOUNTER — Inpatient Hospital Stay (HOSPITAL_COMMUNITY): Payer: Medicare Other

## 2013-08-07 ENCOUNTER — Inpatient Hospital Stay (HOSPITAL_COMMUNITY)
Admission: EM | Admit: 2013-08-07 | Discharge: 2013-08-10 | DRG: 378 | Disposition: A | Discharge status: Home Health | Payer: Medicare Other | Attending: Internal Medicine | Admitting: Internal Medicine

## 2013-08-07 DIAGNOSIS — I509 Heart failure, unspecified: Secondary | ICD-10-CM

## 2013-08-07 DIAGNOSIS — D62 Acute posthemorrhagic anemia: Secondary | ICD-10-CM | POA: Diagnosis present

## 2013-08-07 DIAGNOSIS — K219 Gastro-esophageal reflux disease without esophagitis: Secondary | ICD-10-CM

## 2013-08-07 DIAGNOSIS — K922 Gastrointestinal hemorrhage, unspecified: Secondary | ICD-10-CM

## 2013-08-07 DIAGNOSIS — K5731 Diverticulosis of large intestine without perforation or abscess with bleeding: Principal | ICD-10-CM | POA: Diagnosis present

## 2013-08-07 DIAGNOSIS — Z8673 Personal history of transient ischemic attack (TIA), and cerebral infarction without residual deficits: Secondary | ICD-10-CM

## 2013-08-07 DIAGNOSIS — F329 Major depressive disorder, single episode, unspecified: Secondary | ICD-10-CM

## 2013-08-07 DIAGNOSIS — J8410 Pulmonary fibrosis, unspecified: Secondary | ICD-10-CM

## 2013-08-07 DIAGNOSIS — I9589 Other hypotension: Secondary | ICD-10-CM | POA: Diagnosis present

## 2013-08-07 DIAGNOSIS — I129 Hypertensive chronic kidney disease with stage 1 through stage 4 chronic kidney disease, or unspecified chronic kidney disease: Secondary | ICD-10-CM | POA: Diagnosis present

## 2013-08-07 DIAGNOSIS — E7800 Pure hypercholesterolemia, unspecified: Secondary | ICD-10-CM

## 2013-08-07 DIAGNOSIS — D631 Anemia in chronic kidney disease: Secondary | ICD-10-CM

## 2013-08-07 DIAGNOSIS — R58 Hemorrhage, not elsewhere classified: Secondary | ICD-10-CM

## 2013-08-07 DIAGNOSIS — F3289 Other specified depressive episodes: Secondary | ICD-10-CM

## 2013-08-07 DIAGNOSIS — E079 Disorder of thyroid, unspecified: Secondary | ICD-10-CM | POA: Diagnosis present

## 2013-08-07 DIAGNOSIS — R0902 Hypoxemia: Secondary | ICD-10-CM

## 2013-08-07 DIAGNOSIS — N039 Chronic nephritic syndrome with unspecified morphologic changes: Secondary | ICD-10-CM

## 2013-08-07 DIAGNOSIS — K644 Residual hemorrhoidal skin tags: Secondary | ICD-10-CM | POA: Diagnosis present

## 2013-08-07 DIAGNOSIS — Z87891 Personal history of nicotine dependence: Secondary | ICD-10-CM

## 2013-08-07 DIAGNOSIS — E785 Hyperlipidemia, unspecified: Secondary | ICD-10-CM | POA: Diagnosis present

## 2013-08-07 DIAGNOSIS — N184 Chronic kidney disease, stage 4 (severe): Secondary | ICD-10-CM | POA: Diagnosis present

## 2013-08-07 DIAGNOSIS — N189 Chronic kidney disease, unspecified: Secondary | ICD-10-CM

## 2013-08-07 DIAGNOSIS — Z79899 Other long term (current) drug therapy: Secondary | ICD-10-CM

## 2013-08-07 DIAGNOSIS — K5730 Diverticulosis of large intestine without perforation or abscess without bleeding: Secondary | ICD-10-CM

## 2013-08-07 DIAGNOSIS — I10 Essential (primary) hypertension: Secondary | ICD-10-CM

## 2013-08-07 DIAGNOSIS — Z7982 Long term (current) use of aspirin: Secondary | ICD-10-CM

## 2013-08-07 DIAGNOSIS — E119 Type 2 diabetes mellitus without complications: Secondary | ICD-10-CM

## 2013-08-07 HISTORY — DX: Chronic kidney disease, stage 4 (severe): N18.4

## 2013-08-07 HISTORY — DX: Anemia in chronic kidney disease: N18.9

## 2013-08-07 HISTORY — DX: Anemia in chronic kidney disease: D63.1

## 2013-08-07 LAB — PREPARE RBC (CROSSMATCH)

## 2013-08-07 LAB — CBC WITH DIFFERENTIAL/PLATELET
BASOS ABS: 0 10*3/uL (ref 0.0–0.1)
Basophils Relative: 0 % (ref 0–1)
EOS PCT: 4 % (ref 0–5)
Eosinophils Absolute: 0.4 10*3/uL (ref 0.0–0.7)
HEMATOCRIT: 28.1 % — AB (ref 36.0–46.0)
Hemoglobin: 9.3 g/dL — ABNORMAL LOW (ref 12.0–15.0)
LYMPHS PCT: 27 % (ref 12–46)
Lymphs Abs: 3 10*3/uL (ref 0.7–4.0)
MCH: 33.1 pg (ref 26.0–34.0)
MCHC: 33.1 g/dL (ref 30.0–36.0)
MCV: 100 fL (ref 78.0–100.0)
MONO ABS: 1 10*3/uL (ref 0.1–1.0)
MONOS PCT: 9 % (ref 3–12)
Neutro Abs: 6.8 10*3/uL (ref 1.7–7.7)
Neutrophils Relative %: 60 % (ref 43–77)
Platelets: 212 10*3/uL (ref 150–400)
RBC: 2.81 MIL/uL — AB (ref 3.87–5.11)
RDW: 13.7 % (ref 11.5–15.5)
WBC: 11.3 10*3/uL — ABNORMAL HIGH (ref 4.0–10.5)

## 2013-08-07 LAB — COMPREHENSIVE METABOLIC PANEL
ALT: 15 U/L (ref 0–35)
AST: 13 U/L (ref 0–37)
Albumin: 2.9 g/dL — ABNORMAL LOW (ref 3.5–5.2)
Alkaline Phosphatase: 77 U/L (ref 39–117)
BUN: 60 mg/dL — AB (ref 6–23)
CO2: 26 meq/L (ref 19–32)
CREATININE: 2.51 mg/dL — AB (ref 0.50–1.10)
Calcium: 8 mg/dL — ABNORMAL LOW (ref 8.4–10.5)
Chloride: 99 mEq/L (ref 96–112)
GFR calc non Af Amer: 17 mL/min — ABNORMAL LOW (ref >90)
GFR, EST AFRICAN AMERICAN: 20 mL/min — AB (ref >90)
GLUCOSE: 192 mg/dL — AB (ref 70–99)
Potassium: 4.6 mEq/L (ref 3.7–5.3)
Sodium: 137 mEq/L (ref 137–147)
Total Protein: 6.3 g/dL (ref 6.0–8.3)

## 2013-08-07 LAB — ABO/RH: ABO/RH(D): A POS

## 2013-08-07 LAB — BASIC METABOLIC PANEL
BUN: 52 mg/dL — AB (ref 6–23)
CO2: 16 mEq/L — ABNORMAL LOW (ref 19–32)
CREATININE: 2.01 mg/dL — AB (ref 0.50–1.10)
Calcium: 6.2 mg/dL — CL (ref 8.4–10.5)
Chloride: 112 mEq/L (ref 96–112)
GFR, EST AFRICAN AMERICAN: 26 mL/min — AB (ref >90)
GFR, EST NON AFRICAN AMERICAN: 22 mL/min — AB (ref >90)
GLUCOSE: 267 mg/dL — AB (ref 70–99)
POTASSIUM: 5.9 meq/L — AB (ref 3.7–5.3)
Sodium: 140 mEq/L (ref 137–147)

## 2013-08-07 LAB — HEMOGLOBIN AND HEMATOCRIT, BLOOD
HCT: 21.1 % — ABNORMAL LOW (ref 36.0–46.0)
HCT: 22.7 % — ABNORMAL LOW (ref 36.0–46.0)
HCT: 20.3 % — ABNORMAL LOW (ref 36.0–46.0)
HCT: 17.6 % — ABNORMAL LOW (ref 36.0–46.0)
HEMOGLOBIN: 7.9 g/dL — AB (ref 12.0–15.0)
HEMOGLOBIN: 7.1 g/dL — AB (ref 12.0–15.0)
Hemoglobin: 7.1 g/dL — ABNORMAL LOW (ref 12.0–15.0)
Hemoglobin: 6.2 g/dL — CL (ref 12.0–15.0)

## 2013-08-07 LAB — GLUCOSE, CAPILLARY
GLUCOSE-CAPILLARY: 249 mg/dL — AB (ref 70–99)
Glucose-Capillary: 191 mg/dL — ABNORMAL HIGH (ref 70–99)
Glucose-Capillary: 159 mg/dL — ABNORMAL HIGH (ref 70–99)
Glucose-Capillary: 123 mg/dL — ABNORMAL HIGH (ref 70–99)

## 2013-08-07 LAB — TROPONIN I: Troponin I: <0.3 ng/mL (ref <0.30)

## 2013-08-07 LAB — TSH: TSH: 2.75 u[IU]/mL (ref 0.350–4.500)

## 2013-08-07 LAB — CBC
HEMATOCRIT: 25.8 % — AB (ref 36.0–46.0)
Hemoglobin: 8.9 g/dL — ABNORMAL LOW (ref 12.0–15.0)
MCH: 32 pg (ref 26.0–34.0)
MCHC: 34.5 g/dL (ref 30.0–36.0)
MCV: 92.8 fL (ref 78.0–100.0)
PLATELETS: 146 10*3/uL — AB (ref 150–400)
RBC: 2.78 MIL/uL — ABNORMAL LOW (ref 3.87–5.11)
RDW: 16.4 % — AB (ref 11.5–15.5)
WBC: 13 10*3/uL — ABNORMAL HIGH (ref 4.0–10.5)

## 2013-08-07 LAB — APTT: aPTT: 28 seconds (ref 24–37)

## 2013-08-07 LAB — LACTIC ACID, PLASMA: Lactic Acid, Venous: 2.8 mmol/L — ABNORMAL HIGH (ref 0.5–2.2)

## 2013-08-07 LAB — MAGNESIUM: Magnesium: 1.9 mg/dL (ref 1.5–2.5)

## 2013-08-07 LAB — HEMOGLOBIN A1C
Hgb A1c MFr Bld: 6.4 % — ABNORMAL HIGH (ref <5.7)
Mean Plasma Glucose: 137 mg/dL — ABNORMAL HIGH (ref <117)

## 2013-08-07 LAB — MRSA PCR SCREENING: MRSA by PCR: POSITIVE — AB

## 2013-08-07 LAB — PROTIME-INR
INR: 1.13 (ref 0.00–1.49)
Prothrombin Time: 14.3 seconds (ref 11.6–15.2)

## 2013-08-07 LAB — PHOSPHORUS: Phosphorus: 4.4 mg/dL (ref 2.3–4.6)

## 2013-08-07 MED ORDER — ONDANSETRON HCL 4 MG/2ML IJ SOLN
4.0000 mg | Freq: Three times a day (TID) | INTRAMUSCULAR | Status: AC | PRN
  Administered 2013-08-07: 4 mg via INTRAVENOUS

## 2013-08-07 MED ORDER — METOCLOPRAMIDE HCL 5 MG/ML IJ SOLN
5.0000 mg | Freq: Once | INTRAMUSCULAR | Status: AC
  Administered 2013-08-08: 5 mg via INTRAVENOUS
  Filled 2013-08-07: qty 2

## 2013-08-07 MED ORDER — METOCLOPRAMIDE HCL 5 MG/ML IJ SOLN
5.0000 mg | Freq: Once | INTRAMUSCULAR | Status: AC
  Administered 2013-08-07: 5 mg via INTRAVENOUS
  Filled 2013-08-07: qty 2

## 2013-08-07 MED ORDER — ONDANSETRON HCL 4 MG/2ML IJ SOLN
INTRAMUSCULAR | Status: AC
  Administered 2013-08-07: 4 mg
  Filled 2013-08-07: qty 2

## 2013-08-07 MED ORDER — PANTOPRAZOLE SODIUM 40 MG IV SOLR
40.0000 mg | Freq: Every day | INTRAVENOUS | Status: DC
  Administered 2013-08-07 – 2013-08-09 (×3): 40 mg via INTRAVENOUS
  Filled 2013-08-07 (×4): qty 40

## 2013-08-07 MED ORDER — SODIUM CHLORIDE 0.9 % IV SOLN
INTRAVENOUS | Status: AC
  Administered 2013-08-07: 04:00:00 via INTRAVENOUS

## 2013-08-07 MED ORDER — PEG-KCL-NACL-NASULF-NA ASC-C 100 G PO SOLR
1.0000 | Freq: Once | ORAL | Status: AC
Start: 2013-08-07 — End: 2013-08-07
  Administered 2013-08-07: 200 g via ORAL
  Filled 2013-08-07: qty 1

## 2013-08-07 MED ORDER — PRIMIDONE 50 MG PO TABS
50.0000 mg | ORAL_TABLET | Freq: Two times a day (BID) | ORAL | Status: DC
  Administered 2013-08-07 – 2013-08-10 (×7): 50 mg via ORAL
  Filled 2013-08-07 (×8): qty 1

## 2013-08-07 MED ORDER — INSULIN ASPART 100 UNIT/ML ~~LOC~~ SOLN: 0.0000 [IU] | Freq: Every day | SUBCUTANEOUS | Status: DC

## 2013-08-07 MED ORDER — SODIUM CHLORIDE 0.9 % IV SOLN: 250.0000 mL | INTRAVENOUS | Status: DC | PRN

## 2013-08-07 MED ORDER — INSULIN ASPART 100 UNIT/ML ~~LOC~~ SOLN
0.0000 [IU] | Freq: Three times a day (TID) | SUBCUTANEOUS | Status: DC
  Administered 2013-08-07: 3 [IU] via SUBCUTANEOUS
  Administered 2013-08-07 – 2013-08-09 (×3): 2 [IU] via SUBCUTANEOUS
  Administered 2013-08-09: 3 [IU] via SUBCUTANEOUS
  Administered 2013-08-10 (×2): 2 [IU] via SUBCUTANEOUS

## 2013-08-07 MED ORDER — CALCIUM GLUCONATE 10 % IV SOLN
1.0000 g | Freq: Once | INTRAVENOUS | Status: AC
  Administered 2013-08-07: 1 g via INTRAVENOUS
  Filled 2013-08-07: qty 10

## 2013-08-07 MED ORDER — SODIUM CHLORIDE 0.9 % IV BOLUS (SEPSIS)
500.0000 mL | Freq: Once | INTRAVENOUS | Status: AC
  Administered 2013-08-07: 500 mL via INTRAVENOUS

## 2013-08-07 MED ORDER — ONDANSETRON HCL 4 MG/2ML IJ SOLN
INTRAMUSCULAR | Status: AC
  Filled 2013-08-07: qty 2

## 2013-08-07 MED ORDER — ONDANSETRON HCL 4 MG/2ML IJ SOLN
4.0000 mg | Freq: Once | INTRAMUSCULAR | Status: AC
  Administered 2013-08-07: 4 mg via INTRAVENOUS

## 2013-08-07 MED ORDER — CHLORHEXIDINE GLUCONATE CLOTH 2 % EX PADS
6.0000 | MEDICATED_PAD | Freq: Every day | CUTANEOUS | Status: DC
  Administered 2013-08-07 – 2013-08-10 (×4): 6 via TOPICAL

## 2013-08-07 MED ORDER — IPRATROPIUM-ALBUTEROL 0.5-2.5 (3) MG/3ML IN SOLN: 3.0000 mL | RESPIRATORY_TRACT | Status: DC | PRN

## 2013-08-07 MED ORDER — MUPIROCIN 2 % EX OINT
1.0000 "application " | TOPICAL_OINTMENT | Freq: Two times a day (BID) | CUTANEOUS | Status: DC
  Administered 2013-08-07 – 2013-08-10 (×7): 1 via NASAL
  Filled 2013-08-07 (×2): qty 22

## 2013-08-07 MED ORDER — ONDANSETRON HCL 4 MG/2ML IJ SOLN
4.0000 mg | Freq: Three times a day (TID) | INTRAMUSCULAR | Status: DC | PRN
Start: 2013-08-07 — End: 2013-08-07
  Filled 2013-08-07: qty 2

## 2013-08-07 MED ORDER — SODIUM CHLORIDE 0.9 % IV BOLUS (SEPSIS)
500.0000 mL | Freq: Once | INTRAVENOUS | Status: AC
Start: 2013-08-07 — End: 2013-08-07
  Administered 2013-08-07: 500 mL via INTRAVENOUS

## 2013-08-07 MED ORDER — ONDANSETRON HCL 4 MG/2ML IJ SOLN
4.0000 mg | Freq: Once | INTRAMUSCULAR | Status: AC
  Administered 2013-08-07: 4 mg via INTRAVENOUS
  Filled 2013-08-07: qty 2

## 2013-08-07 MED ORDER — NOREPINEPHRINE BITARTRATE 1 MG/ML IJ SOLN
2.0000 ug/min | Freq: Once | INTRAVENOUS | Status: AC
  Administered 2013-08-07: 5 ug/min via INTRAVENOUS
  Filled 2013-08-07: qty 4

## 2013-08-07 MED ORDER — SODIUM CHLORIDE 0.9 % IV SOLN
INTRAVENOUS | Status: DC
Start: 2013-08-07 — End: 2013-08-10
  Administered 2013-08-07 – 2013-08-08 (×4): via INTRAVENOUS
  Administered 2013-08-10: 20 mL/h via INTRAVENOUS

## 2013-08-07 NOTE — ED Notes (Signed)
Per EMS pt presents to the department from home, pt c/o of bloody bright red stool that started about an hour ago. Pt c/o of abdominal pain. Per EMS pt states she has been having problems passing normal BMs at home the past few weeks, pt states she has been taking medications to help pass stool. Per EMS pt is orthostatic, pt's BP dropped to 92 palpated when in a sitting position. Pt states she wears 2L 02 at home always. Per EMS pt has a hx of diabetes & HTN.

## 2013-08-07 NOTE — ED Notes (Signed)
1st unit of emergent RBC stopped at 0430. 2nd unit of blood started at 0445.

## 2013-08-07 NOTE — ED Notes (Signed)
MD at BS

## 2013-08-07 NOTE — Progress Notes (Signed)
CRITICAL VALUE ALERT  Critical value received: Calcium 6.2  Date of notification:  08-07-13  Time of notification:  0900  Critical value read back:yes  Nurse who received alert:  Hurshel Party, RN  MD notified (1st page):  Dr. Lake Bells  Time of first page:  0900  MD notified (2nd page):  Time of second page:  Responding MD:  Dr. Lake Bells  Time MD responded:  [phone]

## 2013-08-07 NOTE — H&P (Signed)
Name: Susan Mccoy MRN: [id number] DOB: [birth year]    ADMISSION DATE:  08/07/2013   REFERRING MD :  ED PRIMARY SERVICE: PCCM  CHIEF COMPLAINT:  hematochezia  BRIEF PATIENT DESCRIPTION: 51F on aspirin and history of diverticulosis, coming in with multiple bouts of hemathochezia and vagal episodes, Hb fell more than 2 g acutely, s/p 2 rbc transfusion.  SIGNIFICANT EVENTS / STUDIES:  Vasovagal episode in ED and on the floor. GI consulted.  LINES / TUBES:   CULTURES: None  ANTIBIOTICS: None  HISTORY OF PRESENT ILLNESS:  This is a critical care admission for a patient with GI bleed. Patient presents with 2 episodes of bloody stool starting last night about 11:30PM with bright red blood per rectum. She states that her abdomen felt like she needs to have a bowel movement beforehand. She denies any abdominal pain. She did state that when she stood she was lightheaded. This is resolved while she is lying down. She has no focal weakness. Patient denies being on any blood thinners other than aspirin. No previous episodes similar to this in the past. She denies any chest pain or shortness of breath. Does have some R arm pain, first troponin negative, EKG no acute ST elevation or depression nor deep t-wave inversion. GI consulted by ED. When she was in ED she had a vasovagal episode while having a bowel movement and also in the floor. BP bounced back quickly within a few minutes after each episode. Hb did drop more than 2 g, got 2 units rbc.  Was a previous smoker. No EtOH, no drug abuse. Apparently has a history of pulmonary fibrosis, HTN, GERD, TIA,  diverticulosis, hemorrhoid, heart failure   PAST MEDICAL HISTORY :  Past Medical History  Diagnosis Date  . Hypertension   . Thyroid disease   . Abnormal LFTs   . Hypercholesterolemia   . Carpal tunnel syndrome   . DJD (degenerative joint disease)   . Shingles   . Meralgia paraesthetica   . GERD (gastroesophageal reflux disease)     . Pulmonary fibrosis   . Hemorrhoids   . Diverticulosis   . Normocytic normochromic anemia   . History of recurrent TIAs   . UTI (lower urinary tract infection)   . Complication of anesthesia     trouble with putting her to sleep  . CHF (congestive heart failure)    Past Surgical History  Procedure Laterality Date  . Back surgery      X 2  . Tonsillectomy    . Cataract extraction, bilateral    . Breast biopsy  B/L- said to be negative for malignancy.   Prior to Admission medications   Medication Sig Start Date End Date Taking? Authorizing Provider  allopurinol (ZYLOPRIM) 300 MG tablet Take 150 mg by mouth daily.   Yes Historical Provider, MD  aspirin 325 MG tablet Take 325 mg by mouth every evening.    Yes Historical Provider, MD  atenolol (TENORMIN) 25 MG tablet Take 25 mg by mouth daily.    Yes Historical Provider, MD  beta carotene w/minerals (OCUVITE) tablet Take 1 tablet by mouth daily.   Yes Historical Provider, MD  cholecalciferol (VITAMIN D) 1000 UNITS tablet Take 1,000 Units by mouth daily.   Yes Historical Provider, MD  Cyanocobalamin (VITAMIN B-12 IJ) Inject 1 Syringe as directed every 30 (thirty) days. As directed. Due 08/19/2013   Yes Historical Provider, MD  cyclobenzaprine (FLEXERIL) 10 MG tablet Take 5 mg by mouth at bedtime.  Yes Historical Provider, MD  ferrous sulfate 325 (65 FE) MG tablet Take 325 mg by mouth daily with breakfast.   Yes Historical Provider, MD  furosemide (LASIX) 40 MG tablet Take 40 mg by mouth daily.   Yes Historical Provider, MD  glipiZIDE (GLUCOTROL XL) 10 MG 24 hr tablet Take 10 mg by mouth daily.   Yes Historical Provider, MD  lansoprazole (PREVACID) 30 MG capsule Take 30 mg by mouth daily. Before a meal once a day   Yes Historical Provider, MD  primidone (MYSOLINE) 50 MG tablet Take 50 mg by mouth 2 (two) times daily.   Yes Historical Provider, MD   Allergies  Allergen Reactions  . Penicillins     REACTION: GI Upset  . Sulfonamide  Derivatives     Unknown. Symptoms were when she was a teenager  . Celebrex [Celecoxib] Rash    FAMILY HISTORY:  No family history on file. SOCIAL HISTORY:  reports that she has quit smoking. She has never used smokeless tobacco. She reports that she does not drink alcohol or use illicit drugs.  REVIEW OF SYSTEMS:  Has abd discomfort, sensation of flushing + dizziness + nausea whenever she is having a bowel movement. No chest pain, no productive cough, no fever, no chills, no dysuria, no vomiting, no focal weakness, no vision changes, no numbness. Does have right arm pain. No acute rash. All other systems negative.  SUBJECTIVE:   VITAL SIGNS: Temp:  [97.3 F (36.3 C)-97.7 F (36.5 C)] 97.3 F (36.3 C) (01/04 0500) Pulse Rate:  [68-97] 86 (01/04 0600) Resp:  [9-22] 22 (01/04 0600) BP: (96-133)/(43-88) 96/62 mmHg (01/04 0600) SpO2:  [100 %] 100 % (01/04 0600) Weight:  [65.2 kg (143 lb 11.8 oz)] 65.2 kg (143 lb 11.8 oz) (01/04 0500) HEMODYNAMICS:   VENTILATOR SETTINGS:   INTAKE / OUTPUT: Intake/Output     01/03 0701 - 01/04 0700   I.V. (mL/kg) 304.2 (4.7)   Blood 700   Total Intake(mL/kg) 1004.2 (15.4)   Net +1004.2         PHYSICAL EXAMINATION: General:  Awake, sleepy but easily arousable, oriented x 3 Neuro:  No focal weakness, CN 2-12 grossly unremarkable, no slurred speech, no aphasia HEENT:  Atraumatic, no oral lesion, no nasal lesion, no facial tenderness, no stridor, EOM full and equal Cardiovascular:  RRR, no loud murmur Lungs:  Bibasilar rales, no wheeze, good air entry Abdomen:  Soft, no guarding, hyperactive bowel movement, no tenderness Musculoskeletal:  No leg swelling, no fractures Skin:  Has a few small purpura, no sacral ulcer  LABS:  CBC  Recent Labs Lab 08/07/13 0130 08/07/13 0240  WBC 11.3*  --   HGB 9.3* 7.1*  HCT 28.1* 21.1*  PLT 212  --    Coag's  Recent Labs Lab 08/07/13 0130  APTT 28  INR 1.13   BMET  Recent Labs Lab  08/07/13 0130  NA 137  K 4.6  CL 99  CO2 26  BUN 60*  CREATININE 2.51*  GLUCOSE 192*   Electrolytes  Recent Labs Lab 08/07/13 0130  CALCIUM 8.0*   Sepsis Markers No results found for this basename: LATICACIDVEN, PROCALCITON, O2SATVEN,  in the last 168 hours ABG No results found for this basename: PHART, PCO2ART, PO2ART,  in the last 168 hours Liver Enzymes  Recent Labs Lab 08/07/13 0130  AST 13  ALT 15  ALKPHOS 77  BILITOT <0.2*  ALBUMIN 2.9*   Cardiac Enzymes  Recent Labs Lab 08/07/13 0130  TROPONINI <  0.30   Glucose No results found for this basename: GLUCAP,  in the last 168 hours  Imaging No results found.   CXR: pending  ASSESSMENT / PLAN:  PULMONARY A: history of pulmonary fibrosis, previous smoker.  P:   Baseline CXR and monitor. Oxygen supplement if needed. PRN bronchodilator  CARDIOVASCULAR A: history of heart failure & HTN & HLD P:  Echo, recheck troponin. Will stop BP meds for now. Check lactate. IVF resuscitation + blood transfusion In the future resume HLD meds when not NPO.  RENAL A:  Has history of CKD. Now has elevated Cr, unknown baseline, possibly AKI on top of CKD from acute anemia P:   IVF resuscitation + blood transfusion, follow Cr. Avoid nephrotoxic drugs. Follow urine output  GASTROINTESTINAL A:  GI bleed likely lower GI, possibly from diverticula P:   Protonix, rbc transfusion Keep NPO. Gi consult. Withold ASA. No heparin. When patient not NPO then restart home meds.  HEMATOLOGIC A:  Acute anemia due to GI bleed P:  Transfuse rbc, follow up Hb  INFECTIOUS A:  No evidence of infection for now P:   Incentive spirometry to minimize chance of PNA  ENDOCRINE A:  History of DM and "thyroid problem" P:   Check HbA1c and TSH. Blood sugar control goal 140-180.  NEUROLOGIC A:  vasovagal P:   Monitor and resuscitate as needed  TODAY'S SUMMARY: 36 F admitted for multiple hematochezia + vasovagal episodes, Hb  dropped > 2 g, got 2 units rbc. GI consult pending.  I have personally obtained a history, examined the patient, evaluated laboratory and imaging results, formulated the assessment and plan and placed orders. CRITICAL CARE: The patient is critically ill with multiple organ systems failure and requires high complexity decision making for assessment and support, frequent evaluation and titration of therapies, application of advanced monitoring technologies and extensive interpretation of multiple databases. Critical Care Time devoted to patient care services described in this note is 45 minutes.    Pulmonary and [place] Pager: [phone]  08/07/2013, 6:18 AM

## 2013-08-07 NOTE — Consult Note (Signed)
Consultation  Referring Provider:     CCM Primary Care Physician:  Henrine Screws, MD Primary Gastroenterologist:  Dr. Sammuel Cooper in past       Reason for Consultation:     GI bleed     Impression / Plan:   1) Acute lower GI bleed complicated by hypotension and vagal episodes. 2) Chronic anemia w/ baseline Hgb 10 - likely chronic kidney disease etiology 3) Last colonoscopy about 5 years ago - exact results unknown but thought to have diverticulosis - suspect she has bleeding from diverticulosis  1) Support today, clear liquids ok 2) Colonoscopy tomorrow by Dr. Ardis Hughs - w/ possible EGD but doubt that will be needed 3) The risks and benefits as well as alternatives of endoscopic procedure(s) have been discussed and reviewed. All questions answered. The patient agrees to proceed.           HPI:   Susan Mccoy is a 80 y.o. female in her usual state of health until she had sudden onset of painless hematochezia at about 80 PM 1/3. In ED several more red bloody stools and some clots. Had hypotension and vagal episode with pre-syncope at least. Initial Hgb 9 then 7. 2 U's PRBC's given. Has had at least one more bloody stool since being in ICU. Feels weak. + presyncope with standing. No abdominal pain. Has chronic constipation and takes OTC laxative pill. Stools might have been a bit more narrow in past few days but no melena at all. ASA qd but only acetaminophen o/w, no NSAID's.   No prior hx bleeding. Prior routine colonoscopy probably had diverticulosis but records not here. - Patient recall. Daughter present for interview also.   Past Medical History  Diagnosis Date  . Hypertension   . Thyroid disease   . Abnormal LFTs     Fatty liver by [place]  . Hypercholesterolemia   . Carpal tunnel syndrome   . DJD (degenerative joint disease)   . Shingles   . Meralgia paraesthetica   . GERD (gastroesophageal reflux disease)   . Pulmonary fibrosis   . Hemorrhoids   .  Diverticulosis   . Normocytic normochromic anemia   . History of recurrent TIAs   . UTI (lower urinary tract infection)   . Complication of anesthesia     trouble with putting her to sleep  . CHF (congestive heart failure)   Chronic kidney failure Chronic anemia Past Surgical History  Procedure Laterality Date  . Back surgery      X 2 - lumbar  . Tonsillectomy    . Cataract extraction, bilateral    . Breast biopsy  B/L- said to be negative for malignancy.  . Colonoscopy     Family History  Problem Relation Age of Onset  . Emphysema Father     smoker  . Emphysema Brother     smoker      History  Substance Use Topics  . Smoking status: Former Smoker -- 2.00 packs/day for 40 years  . Smokeless tobacco: Never Used  . Alcohol Use: No    Prior to Admission medications   Medication Sig Start Date End Date Taking? Authorizing Provider  allopurinol (ZYLOPRIM) 300 MG tablet Take 150 mg by mouth daily.   Yes Historical Provider, MD  aspirin 325 MG tablet Take 325 mg by mouth every evening.    Yes Historical Provider, MD  atenolol (TENORMIN) 25 MG tablet Take 25 mg by mouth daily.    Yes Historical Provider,  MD  beta carotene w/minerals (OCUVITE) tablet Take 1 tablet by mouth daily.   Yes Historical Provider, MD  cholecalciferol (VITAMIN D) 1000 UNITS tablet Take 1,000 Units by mouth daily.   Yes Historical Provider, MD  Cyanocobalamin (VITAMIN B-12 IJ) Inject 1 Syringe as directed every 30 (thirty) days. As directed. Due 08/19/2013   Yes Historical Provider, MD  cyclobenzaprine (FLEXERIL) 10 MG tablet Take 5 mg by mouth at bedtime.   Yes Historical Provider, MD  ferrous sulfate 325 (65 FE) MG tablet Take 325 mg by mouth daily with breakfast.   Yes Historical Provider, MD  furosemide (LASIX) 40 MG tablet Take 40 mg by mouth daily.   Yes Historical Provider, MD  glipiZIDE (GLUCOTROL XL) 10 MG 24 hr tablet Take 10 mg by mouth daily.   Yes Historical Provider, MD  lansoprazole  (PREVACID) 30 MG capsule Take 30 mg by mouth daily. Before a meal once a day   Yes Historical Provider, MD  primidone (MYSOLINE) 50 MG tablet Take 50 mg by mouth 2 (two) times daily.   Yes Historical Provider, MD    Current Facility-Administered Medications  Medication Dose Route Frequency Provider Last Rate Last Dose  . 0.9 %  sodium chloride infusion   Intravenous STAT Julianne Rice, MD 75 mL/hr at 08/07/13 0334    . 0.9 %  sodium chloride infusion   Intravenous STAT Julianne Rice, MD 125 mL/hr at 08/07/13 0600    . 0.9 %  sodium chloride infusion  250 mL Intravenous PRN Alric Quan, MD      . 0.9 %  sodium chloride infusion   Intravenous Continuous Alric Quan, MD      . ipratropium-albuterol (DUONEB) 0.5-2.5 (3) MG/3ML nebulizer solution 3 mL  3 mL Nebulization Q2H PRN Alric Quan, MD      . ondansetron [facility]) injection 4 mg  4 mg Intravenous Q8H PRN Julianne Rice, MD   4 mg at 08/07/13 0411  . pantoprazole (PROTONIX) injection 40 mg  40 mg Intravenous QHS Alric Quan, MD        Allergies as of 08/07/2013 - Review Complete 08/07/2013  Allergen Reaction Noted  . Penicillins    . Sulfonamide derivatives    . Celebrex [celecoxib] Rash 12/18/2011     Review of Systems:    This is positive for those things mentioned in the HPI, also positive for arm pains, weakness. All other review of systems are negative.    Physical Exam:  Vital signs in last 24 hours: Temp:  [97.3 F (36.3 C)-97.7 F (36.5 C)] 97.3 F (36.3 C) (01/04 0500) Pulse Rate:  [68-97] 80 (01/04 0700) Resp:  [9-22] 18 (01/04 0700) BP: (96-133)/(43-88) 102/86 mmHg (01/04 0700) SpO2:  [100 %] 100 % (01/04 0700) Weight:  [143 lb 11.8 oz (65.2 kg)] 143 lb 11.8 oz (65.2 kg) (01/04 0500) Last BM Date: 08/07/13  General:  Well-developed, well-nourished elderly woman, acutely ill, not toxic Eyes:  Anicteric with pale conjuctiva ENT:   Mouth and posterior pharynx free of lesions. Pale  mucosa Neck:   supple w/o thyromegaly or mass.  Lungs: Clear to auscultation anterior Heart:  S1S2, no rubs, murmurs, gallops.- distant sounds Abdomen:  soft, non-tender, no hepatosplenomegaly, hernia, or mass and BS+. No bruits Rectal: Not done - obvious bleeding reported Lymph:  no cervical or supraclavicular adenopathy. Extremities:   no edema Skin   some crusted abrasions on pre-tibial areas Neuro:  A&O x 3.  Psych:  appropriate mood and  Affect.  Data Reviewed:   LAB RESULTS:  Recent Labs  08/07/13 0130 08/07/13 0240  WBC 11.3*  --   HGB 9.3* 7.1*  HCT 28.1* 21.1*  PLT 212  --    BMET  Recent Labs  08/07/13 0130  NA 137  K 4.6  CL 99  CO2 26  GLUCOSE 192*  BUN 60*  CREATININE 2.51*  CALCIUM 8.0*   LFT  Recent Labs  08/07/13 0130  PROT 6.3  ALBUMIN 2.9*  AST 13  ALT 15  ALKPHOS 77  BILITOT <0.2*   PT/INR  Recent Labs  08/07/13 0130  LABPROT 14.3  INR 1.13    PREVIOUS ENDOSCOPIES:            Colonoscopy about 5 years ago - "routine, no polyps" - she thinks diverticulosis mentioned   Thanks   LOS: 0 days   @Carl  Simonne Maffucci, MD, [facility] @  08/07/2013, 7:26 AM

## 2013-08-07 NOTE — ED Notes (Signed)
Rate changed to 999 ml/hr per MD verbal order.

## 2013-08-07 NOTE — ED Notes (Signed)
Pt had a vagal response while up on the bedside commode having a BM with NT, MD,RN,NT all at [facility], pt placed in bed from bedside commode, placed on a NRB at 15L/min, pt pale. Pt's 02 sats reading 78% on 2L 02, after placed on NRB pt's 02 sats reading 100%. Pt's BP dropped to 90/54, pt's BP rechecked and reading 100/70. Pt reoriented and informed of what happened, pt does not remember syncope. 2nd IV started. Pt c/o of nausea, pt give 4mg  zofran IV.

## 2013-08-07 NOTE — ED Provider Notes (Addendum)
CSN: [id number]     Arrival date & time 08/07/13  0040 History   First MD Initiated Contact with Patient 08/07/13 0046     Chief Complaint  Patient presents with  . GI Bleeding   (Consider location/radiation/quality/duration/timing/severity/associated sxs/prior Treatment) HPI Patient presents with 2 episodes of bloody stool starting this evening roughly one hour before presentation. She states that her abdomen felt like she needs to have a bowel movement beforehand. She denies any abdominal pain. She did state that when she stood she was lightheaded. This is resolved while she is lying down. She has no focal weakness. Patient denies being on any blood thinners other than aspirin. He's had no previous episodes similar to this in the past. She denies any chest pain or shortness of breath. Past Medical History  Diagnosis Date  . Hypertension   . Thyroid disease   . Abnormal LFTs   . Hypercholesterolemia   . Carpal tunnel syndrome   . DJD (degenerative joint disease)   . Shingles   . Meralgia paraesthetica   . GERD (gastroesophageal reflux disease)   . Pulmonary fibrosis   . Hemorrhoids   . Diverticulosis   . Normocytic normochromic anemia   . History of recurrent TIAs   . UTI (lower urinary tract infection)   . Complication of anesthesia     trouble with putting her to sleep  . CHF (congestive heart failure)    Past Surgical History  Procedure Laterality Date  . Back surgery      X 2  . Tonsillectomy    . Cataract extraction, bilateral    . Breast biopsy  B/L- said to be negative for malignancy.   No family history on file. History  Substance Use Topics  . Smoking status: Former Smoker -- 2.00 packs/day for 40 years  . Smokeless tobacco: Never Used  . Alcohol Use: No   OB History   Grav Para Term Preterm Abortions TAB SAB Ect Mult Living                 Review of Systems  Constitutional: Negative for fever and chills.  Respiratory: Negative for shortness of breath.    Cardiovascular: Negative for chest pain.  Gastrointestinal: Positive for blood in stool. Negative for nausea, vomiting, abdominal pain, diarrhea and constipation.  Genitourinary: Negative for dysuria and frequency.  Musculoskeletal: Negative for back pain.  Skin: Negative for rash and wound.  Neurological: Positive for dizziness and light-headedness. Negative for tremors, syncope, weakness, numbness and headaches.  All other systems reviewed and are negative.    Allergies  Penicillins; Sulfonamide derivatives; and Celebrex  Home Medications   Current Outpatient Rx  Name  Route  Sig  Dispense  Refill  . allopurinol (ZYLOPRIM) 150 mg TABS tablet   Oral   Take 150 mg by mouth daily.         Marland Kitchen amLODipine (NORVASC) 5 MG tablet   Oral   Take 5 mg by mouth daily.         Marland Kitchen aspirin 325 MG tablet   Oral   Take 325 mg by mouth every evening.          Marland Kitchen atenolol (TENORMIN) 25 MG tablet   Oral   Take 25 mg by mouth daily.          . beta carotene w/minerals (OCUVITE) tablet   Oral   Take 1 tablet by mouth daily.         . cholecalciferol (VITAMIN D) 1000  UNITS tablet   Oral   Take 1,000 Units by mouth daily.         . Cyanocobalamin (VITAMIN B-12 IJ)   Injection   Inject 1 Syringe as directed every 30 (thirty) days. As directed         . cyclobenzaprine (FLEXERIL) 10 MG tablet   Oral   Take 5 mg by mouth at bedtime.         . ferrous sulfate 325 (65 FE) MG tablet   Oral   Take 325 mg by mouth daily with breakfast.         . furosemide (LASIX) 40 MG tablet   Oral   Take 40 mg by mouth 2 (two) times daily.         Marland Kitchen glipiZIDE (GLUCOTROL XL) 10 MG 24 hr tablet   Oral   Take 10 mg by mouth daily.         . lansoprazole (PREVACID) 30 MG capsule   Oral   Take 30 mg by mouth daily. Before a meal once a day         . primidone (MYSOLINE) 50 MG tablet   Oral   Take 50 mg by mouth 2 (two) times daily.          BP 111/55  Pulse 68   Temp(Src) 97.5 F (36.4 C) (Oral)  Resp 11  SpO2 100% Physical Exam  Nursing note and vitals reviewed. Constitutional: She is oriented to person, place, and time. She appears well-developed and well-nourished. No distress.  HENT:  Head: Normocephalic and atraumatic.  Mouth/Throat: Oropharynx is clear and moist.  Eyes: EOM are normal. Pupils are equal, round, and reactive to light.  Neck: Normal range of motion. Neck supple.  Cardiovascular: Normal rate and regular rhythm.   Pulmonary/Chest: Effort normal and breath sounds normal. No respiratory distress. She has no wheezes. She has no rales. She exhibits no tenderness.  Abdominal: Soft. Bowel sounds are normal. She exhibits no distension and no mass. There is no tenderness. There is no rebound and no guarding.  Musculoskeletal: Normal range of motion. She exhibits no edema and no tenderness.  Neurological: She is alert and oriented to person, place, and time.  Patient is alert and oriented x3 with clear, goal oriented speech. Patient has 5/5 motor in all extremities. Sensation is intact to light touch.    Skin: Skin is warm and dry. No rash noted. No erythema.  Psychiatric: She has a normal mood and affect. Her behavior is normal.    ED Course  Procedures (including critical care time) Labs Review Labs Reviewed - No data to display Imaging Review No results found.  EKG Interpretation    Date/Time:  Sunday August 07 2013 00:54:30 EST Ventricular Rate:  68 PR Interval:  194 QRS Duration: 83 QT Interval:  473 QTC Calculation: 503 R Axis:   81 Text Interpretation:  Sinus rhythm Borderline right axis deviation Prolonged QT interval Confirmed by Lita Mains  MD, Rosaleen Mazer ([id number]) on 08/07/2013 2:24:49 AM          CRITICAL CARE Performed by: Lita Mains, Irie Dowson Total critical care time: 20 min Critical care time was exclusive of separately billable procedures and treating other patients. Critical care was necessary to treat or  prevent imminent or life-threatening deterioration. Critical care was time spent personally by me on the following activities: development of treatment plan with patient and/or surrogate as well as nursing, discussions with consultants, evaluation of patient's response to treatment, examination of patient,  obtaining history from patient or surrogate, ordering and performing treatments and interventions, ordering and review of laboratory studies, ordering and review of radiographic studies, pulse oximetry and re-evaluation of patient's condition.   MDM   Patient had a near syncopal episode while using bedside toilet. She had grossly bloody bowel movement with clots. Patient had thready pulse and became nauseated. She was assisted to the bed and given IV fluids. A second IV line is being established. Will monitor very closely.  Patient is now feeling much better. Systolic blood pressure in the 120s. Will admit to [facility] and discussed with gastroenterology.   Discussed with Dr. Carlean Purl. Consult on patient.  Assessment Dr. Arnoldo Morale. Will admit to a step down bed. Vital signs remained stable in the emergency department. Repeat hemoglobin 7.1. Will go ahead and transfuse 2 units of packed red blood cells  Julianne Rice, MD 08/07/13 [phone]  Patient with continued bloody bowel movements in the emergency department. Blood pressure dropping into the 80s. I discussed with Dr. Nelda Marseille of critical care. Will see in the emergency department. Advises go ahead and transfuse and blood products. Also discussed again with Dr. Carlean Purl. He will come and see the patient in the emergency department.  Patient's blood pressure improved with PRBC's. Will be transferred to the ICU.  Julianne Rice, MD 08/08/13 [phone]

## 2013-08-07 NOTE — Progress Notes (Signed)
PULMONARY / CRITICAL CARE MEDICINE   Name: Susan Mccoy MRN: [id number] DOB: 02-18-1932    ADMISSION DATE:  08/07/2013 CONSULTATION DATE: 08/07/2013   REFERRING MD :  EDP PRIMARY SERVICE: PCCM  CHIEF COMPLAINT:  Hematochezia  BRIEF PATIENT DESCRIPTION: 80 yo on Aspirin with history of diverticulosis admitted with multiple bouts of hemathochezia and vagal episodes, Hb fell more than 2 g acutely, s/p 2 U PRBC transfusion.  SIGNIFICANT EVENTS / STUDIES:  1/5 TTE >>>  LINES / TUBES:  CULTURES:  ANTIBIOTICS:  INTERVAL HISTORY: Drop in Hb last night, transfused 1 unit PRBC. Maroon stool overnight.  VITAL SIGNS: Temp:  [97.8 F (36.6 C)-98.7 F (37.1 C)] 98.2 F (36.8 C) (01/05 0733) Pulse Rate:  [45-105] 78 (01/05 0920) Resp:  [0-26] 20 (01/05 0920) BP: (99-154)/(37-75) 106/64 mmHg (01/05 0920) SpO2:  [59 %-100 %] 77 % (01/05 0920) Weight:  [66.996 kg (147 lb 11.2 oz)] 66.996 kg (147 lb 11.2 oz) (01/05 0500)  HEMODYNAMICS:   VENTILATOR SETTINGS:   INTAKE / OUTPUT: Intake/Output     01/04 0701 - 01/05 0700 01/05 0701 - 01/06 0700   P.O. 3560    I.V. (mL/kg) 2717.1 (40.6) 250 (3.7)   Blood 304.2    IV Piggyback 100    Total Intake(mL/kg) 6681.3 (99.7) 250 (3.7)   Urine (mL/kg/hr) 850 (0.5)    Stool 1255 (0.8)    Total Output 2105     Net +4576.3 +250        Urine Occurrence 4 x 1 x   Stool Occurrence  1 x     PHYSICAL EXAMINATION: General:  No distress, resting comfortable Neuro:  Awake, alert, appropriate HEENT:  PERRL Cardiovascular:  Regular, no murmurs Lungs:  Bilateral diminished air entry Abdomen:  Obese, soft, non tender, bowel sounds present Musculoskeletal:  Moves all extremities, no edema Skin:  No rash  LABS:  CBC  Recent Labs Lab 08/07/13 0130  08/07/13 0750  08/07/13 1830 08/07/13 2158 08/08/13 0635  WBC 11.3*  --  13.0*  --   --   --  12.1*  HGB 9.3*  < > 8.9*  < > 7.1* 6.2* 8.4*  HCT 28.1*  < > 25.8*  < > 20.3* 17.6* 23.6*  PLT  212  --  146*  --   --   --  122*  < > = values in this interval not displayed. Coag's  Recent Labs Lab 08/07/13 0130  APTT 28  INR 1.13   BMET  Recent Labs Lab 08/07/13 0130 08/07/13 0750 08/08/13 0635  NA 137 140 147  K 4.6 5.9* 4.9  CL 99 112 116*  CO2 26 16* 18*  BUN 60* 52* 39*  CREATININE 2.51* 2.01* 1.95*  GLUCOSE 192* 267* 166*   Electrolytes  Recent Labs Lab 08/07/13 0130 08/07/13 0750 08/08/13 0635  CALCIUM 8.0* 6.2* 7.1*  MG  --  1.9 1.8  PHOS  --  4.4 3.2   Sepsis Markers  Recent Labs Lab 08/07/13 0750  LATICACIDVEN 2.8*   ABG No results found for this basename: PHART, PCO2ART, PO2ART,  in the last 168 hours Liver Enzymes  Recent Labs Lab 08/07/13 0130  AST 13  ALT 15  ALKPHOS 77  BILITOT <0.2*  ALBUMIN 2.9*   Cardiac Enzymes  Recent Labs Lab 08/07/13 0130 08/07/13 0750  TROPONINI <0.30 <0.30   Glucose  Recent Labs Lab 08/07/13 0734 08/07/13 1247 08/07/13 1707 08/07/13 2206 08/08/13 0727  GLUCAP 249* 191* 159* 123* 137*  CXR:  None today  ASSESSMENT / PLAN:  PULMONARY A:  H/o pulmonary fibrosis, previous smoker.  P:   Goal SpO2>92 Supplemental oxygen PRN Albuterol / Atrovent PRN  CARDIOVASCULAR A:  Hypotensive episode, likely vagal - resolved. H/o heart failure, hypertension and dyslipidemia. P:  Hold  ASA Atenolol when able Levophed gtt d/c'd  RENAL A: Acute on chronic renal failure. P:   Trend BMP Hold Lasix NS@125   GASTROINTESTINAL A:   Acute lower GI hemorrhage, suspected diverticular. H/o GERD on PPI. P:   NPO for procedure GI consulted Protonix Colonoscopy / possibly EGD today  HEMATOLOGIC A:   Acute anemia due to GI hemorrhage. P:  Goal Hb>7 Trend CBC q6h  INFECTIOUS A:  No evidence of infection for now P:   Incentive spirometry to minimize chance of PNA  ENDOCRINE A:   DM. Hyperlipidemia. P:   SSI Hold Glipizide  NEUROLOGIC A: No active issues. P:   Hold  Flexeril Primidone  I have personally obtained history, examined patient, evaluated and interpreted laboratory and imaging results, reviewed medical records, formulated assessment / plan and placed orders.  CRITICAL CARE:  The patient is critically ill with multiple organ systems failure and requires high complexity decision making for assessment and support, frequent evaluation and titration of therapies, application of advanced monitoring technologies and extensive interpretation of multiple databases. Critical Care Time devoted to patient care services described in this note is 35 minutes.   Doree Fudge, MD Pulmonary and [place] Pager: [phone]  08/08/2013, 10:33 AM

## 2013-08-07 NOTE — Progress Notes (Signed)
PCCM Interval note  Chart reviewed and pt interviewed  Norepi quickly weaned to off. Planning for CSY in am, she will do prep tonight. Explained to her that she will be at risk for vagal events when she has BM, will be monitored at least initially in the ICU.   Will start her home primidone, hold other meds for now. Start CBG's and SSI coverage.   30 minutes CC time  Baltazar Apo, MD, PhD 08/07/2013, 11:04 AM [facility] [phone] or if no answer [phone]

## 2013-08-07 NOTE — Progress Notes (Signed)
Received notification from lab MRSA swab was positive and orders placed per protocol.

## 2013-08-08 ENCOUNTER — Encounter (HOSPITAL_COMMUNITY): Payer: Self-pay

## 2013-08-08 ENCOUNTER — Encounter (HOSPITAL_COMMUNITY)
Admission: EM | Disposition: A | Discharge status: Home Health | Payer: Medicare Other | Source: Home / Self Care | Attending: Pulmonary Disease

## 2013-08-08 DIAGNOSIS — E119 Type 2 diabetes mellitus without complications: Secondary | ICD-10-CM

## 2013-08-08 DIAGNOSIS — D631 Anemia in chronic kidney disease: Secondary | ICD-10-CM

## 2013-08-08 DIAGNOSIS — K5730 Diverticulosis of large intestine without perforation or abscess without bleeding: Secondary | ICD-10-CM

## 2013-08-08 DIAGNOSIS — I369 Nonrheumatic tricuspid valve disorder, unspecified: Secondary | ICD-10-CM

## 2013-08-08 DIAGNOSIS — I509 Heart failure, unspecified: Secondary | ICD-10-CM

## 2013-08-08 DIAGNOSIS — N039 Chronic nephritic syndrome with unspecified morphologic changes: Secondary | ICD-10-CM

## 2013-08-08 DIAGNOSIS — J8410 Pulmonary fibrosis, unspecified: Secondary | ICD-10-CM

## 2013-08-08 DIAGNOSIS — N189 Chronic kidney disease, unspecified: Secondary | ICD-10-CM

## 2013-08-08 DIAGNOSIS — N184 Chronic kidney disease, stage 4 (severe): Secondary | ICD-10-CM

## 2013-08-08 HISTORY — PX: COLONOSCOPY: SHX5424

## 2013-08-08 LAB — BASIC METABOLIC PANEL
BUN: 39 mg/dL — ABNORMAL HIGH (ref 6–23)
CALCIUM: 7.1 mg/dL — AB (ref 8.4–10.5)
CO2: 18 meq/L — AB (ref 19–32)
Chloride: 116 mEq/L — ABNORMAL HIGH (ref 96–112)
Creatinine, Ser: 1.95 mg/dL — ABNORMAL HIGH (ref 0.50–1.10)
GFR calc non Af Amer: 23 mL/min — ABNORMAL LOW (ref >90)
GFR, EST AFRICAN AMERICAN: 27 mL/min — AB (ref >90)
Glucose, Bld: 166 mg/dL — ABNORMAL HIGH (ref 70–99)
Potassium: 4.9 mEq/L (ref 3.7–5.3)
SODIUM: 147 meq/L (ref 137–147)

## 2013-08-08 LAB — CBC
HCT: 23.6 % — ABNORMAL LOW (ref 36.0–46.0)
Hemoglobin: 8.4 g/dL — ABNORMAL LOW (ref 12.0–15.0)
MCH: 31.1 pg (ref 26.0–34.0)
MCHC: 35.6 g/dL (ref 30.0–36.0)
MCV: 87.4 fL (ref 78.0–100.0)
PLATELETS: 122 10*3/uL — AB (ref 150–400)
RBC: 2.7 MIL/uL — AB (ref 3.87–5.11)
RDW: 18.3 % — ABNORMAL HIGH (ref 11.5–15.5)
WBC: 12.1 10*3/uL — ABNORMAL HIGH (ref 4.0–10.5)

## 2013-08-08 LAB — PHOSPHORUS: Phosphorus: 3.2 mg/dL (ref 2.3–4.6)

## 2013-08-08 LAB — GLUCOSE, CAPILLARY
GLUCOSE-CAPILLARY: 137 mg/dL — AB (ref 70–99)
Glucose-Capillary: 95 mg/dL (ref 70–99)
Glucose-Capillary: 83 mg/dL (ref 70–99)

## 2013-08-08 LAB — MAGNESIUM: MAGNESIUM: 1.8 mg/dL (ref 1.5–2.5)

## 2013-08-08 SURGERY — COLONOSCOPY
Anesthesia: Moderate Sedation

## 2013-08-08 MED ORDER — MIDAZOLAM HCL 10 MG/2ML IJ SOLN
INTRAMUSCULAR | Status: DC | PRN
  Administered 2013-08-08: 1 mg via INTRAVENOUS
  Administered 2013-08-08: 2 mg via INTRAVENOUS

## 2013-08-08 MED ORDER — SODIUM CHLORIDE 0.9 % IV SOLN
INTRAVENOUS | Status: DC
  Administered 2013-08-08: 08:00:00 via INTRAVENOUS

## 2013-08-08 MED ORDER — MIDAZOLAM HCL 5 MG/ML IJ SOLN
INTRAMUSCULAR | Status: AC
  Filled 2013-08-08: qty 2

## 2013-08-08 MED ORDER — FENTANYL CITRATE 0.05 MG/ML IJ SOLN
INTRAMUSCULAR | Status: DC | PRN
  Administered 2013-08-08 (×2): 20 ug via INTRAVENOUS

## 2013-08-08 MED ORDER — CYCLOBENZAPRINE HCL 5 MG PO TABS
5.0000 mg | ORAL_TABLET | Freq: Every day | ORAL | Status: DC
  Administered 2013-08-08 – 2013-08-09 (×2): 5 mg via ORAL
  Filled 2013-08-08 (×3): qty 1

## 2013-08-08 MED ORDER — METOPROLOL TARTRATE 1 MG/ML IV SOLN
2.5000 mg | INTRAVENOUS | Status: DC | PRN
  Administered 2013-08-08: 2.5 mg via INTRAVENOUS
  Filled 2013-08-08: qty 5

## 2013-08-08 MED ORDER — ATENOLOL 25 MG PO TABS
25.0000 mg | ORAL_TABLET | Freq: Every day | ORAL | Status: DC
  Administered 2013-08-08 – 2013-08-10 (×3): 25 mg via ORAL
  Filled 2013-08-08 (×3): qty 1

## 2013-08-08 MED ORDER — FENTANYL CITRATE 0.05 MG/ML IJ SOLN
INTRAMUSCULAR | Status: AC
Start: 2013-08-08 — End: 2013-08-08
  Filled 2013-08-08: qty 4

## 2013-08-08 NOTE — Op Note (Signed)
[facility] [place], [id number]   COLONOSCOPY PROCEDURE REPORT  PATIENT: Susan, Mccoy  MR#: [id number] BIRTHDATE: 08/09/1931 , 85  yrs. old GENDER: Female ENDOSCOPIST: [name], MD PROCEDURE DATE:  08/08/2013 PROCEDURE:   Colonoscopy, diagnostic First Screening Colonoscopy - Avg.  risk and is 50 yrs.  old or older - No.  Prior Negative Screening - Now for repeat screening. N/A  History of Adenoma - Now for follow-up colonoscopy & has been > or = to 3 yrs.  N/A  Polyps Removed Today? No.  Recommend repeat exam, <10 yrs? No. ASA CLASS:   Class III INDICATIONS:hematochezia, resolved. MEDICATIONS: Fentanyl-Detailed 40 mcg IV and Versed 3 mg IV  DESCRIPTION OF PROCEDURE:   After the risks benefits and alternatives of the procedure were thoroughly explained, informed consent was obtained.  A digital rectal exam revealed no abnormalities of the rectum.   The Pentax Adult Colon [id number] endoscope was introduced through the anus and advanced to the cecum, which was identified by both the appendix and ileocecal valve. No adverse events experienced.   The quality of the prep was adequate.  The instrument was then slowly withdrawn as the colon was fully examined.   COLON FINDINGS: There was no blood in the colon (old or new).  There were numerous diverticulum in the left colon.  There were medium sized external hemorrhoids.  The examination was otherwise normal. Retroflexed views revealed no abnormalities. The time to cecum=3 minutes 00 seconds.  Withdrawal time=10 minutes 00 seconds.  The scope was withdrawn and the procedure completed. COMPLICATIONS: There were no complications.  ENDOSCOPIC IMPRESSION: There was no blood in the colon (old or new).  There were numerous diverticulum in the left colon.  There were medium sized external hemorrhoids.  The examination was otherwise normal. Likely this was bleeding from colonic  diverticulosis, has resolved as is usually the case.  RECOMMENDATIONS: OK to d/c tomorrow if no recurrent GI bleeding.   eSigned:  [name], MD 08/08/2013 12:39 PM   cc: Silvano Rusk, MD

## 2013-08-08 NOTE — Interval H&P Note (Signed)
History and Physical Interval Note:  08/08/2013 12:00 PM  Susan Mccoy  has presented today for surgery, with the diagnosis of GI bleed  The various methods of treatment have been discussed with the patient and family. After consideration of risks, benefits and other options for treatment, the patient has consented to  Procedure(s) with comments: COLONOSCOPY (N/A) - EGD is possible ESOPHAGOGASTRODUODENOSCOPY (EGD) (N/A) as a surgical intervention .  The patient's history has been reviewed, patient examined, no change in status, stable for surgery.  I have reviewed the patient's chart and labs.  Questions were answered to the patient's satisfaction.     [name]

## 2013-08-08 NOTE — Progress Notes (Signed)
Utilization Review Completed.  

## 2013-08-08 NOTE — Progress Notes (Signed)
Echocardiogram 2D Echocardiogram has been performed.  Susan Mccoy 08/08/2013, 3:27 PM

## 2013-08-08 NOTE — Progress Notes (Addendum)
CRITICAL VALUE ALERT  Critical value received:  Hgb 6.2   Date of notification:  08/07/2013  Time of notification:  2223  Critical value read back:yes  Nurse who received alert:  Candyce Churn RN  MD notified (1st page):  2225   Time of first page:  Called to Dr. Earnest Conroy in [place], order received  MD notified (2nd page):  Time of second page:  Responding MD:  n/a  Time MD responded: n/a

## 2013-08-08 NOTE — Progress Notes (Signed)
Endo RN at bedside to transfer pt, report given. Family (husband, daughter) notified of time of procedure. VS stable, no episodes of vagal. Last BM clear, yellow. [place] notified. Pax Reasoner L

## 2013-08-08 NOTE — H&P (View-Only) (Signed)
Consultation  Referring Provider:     CCM Primary Care Physician:  Henrine Screws, MD Primary Gastroenterologist:  Dr. Sammuel Cooper in past       Reason for Consultation:     GI bleed     Impression / Plan:   1) Acute lower GI bleed complicated by hypotension and vagal episodes. 2) Chronic anemia w/ baseline Hgb 10 - likely chronic kidney disease etiology 3) Last colonoscopy about 5 years ago - exact results unknown but thought to have diverticulosis - suspect she has bleeding from diverticulosis  1) Support today, clear liquids ok 2) Colonoscopy tomorrow by Dr. Ardis Hughs - w/ possible EGD but doubt that will be needed 3) The risks and benefits as well as alternatives of endoscopic procedure(s) have been discussed and reviewed. All questions answered. The patient agrees to proceed.           HPI:   JENIFER STRUVE is a 80 y.o. female in her usual state of health until she had sudden onset of painless hematochezia at about 80 PM 1/3. In ED several more red bloody stools and some clots. Had hypotension and vagal episode with pre-syncope at least. Initial Hgb 9 then 7. 2 U's PRBC's given. Has had at least one more bloody stool since being in ICU. Feels weak. + presyncope with standing. No abdominal pain. Has chronic constipation and takes OTC laxative pill. Stools might have been a bit more narrow in past few days but no melena at all. ASA qd but only acetaminophen o/w, no NSAID's.   No prior hx bleeding. Prior routine colonoscopy probably had diverticulosis but records not here. - Patient recall. Daughter present for interview also.   Past Medical History  Diagnosis Date  . Hypertension   . Thyroid disease   . Abnormal LFTs     Fatty liver by [place]  . Hypercholesterolemia   . Carpal tunnel syndrome   . DJD (degenerative joint disease)   . Shingles   . Meralgia paraesthetica   . GERD (gastroesophageal reflux disease)   . Pulmonary fibrosis   . Hemorrhoids   .  Diverticulosis   . Normocytic normochromic anemia   . History of recurrent TIAs   . UTI (lower urinary tract infection)   . Complication of anesthesia     trouble with putting her to sleep  . CHF (congestive heart failure)   Chronic kidney failure Chronic anemia Past Surgical History  Procedure Laterality Date  . Back surgery      X 2 - lumbar  . Tonsillectomy    . Cataract extraction, bilateral    . Breast biopsy  B/L- said to be negative for malignancy.  . Colonoscopy     Family History  Problem Relation Age of Onset  . Emphysema Father     smoker  . Emphysema Brother     smoker      History  Substance Use Topics  . Smoking status: Former Smoker -- 2.00 packs/day for 40 years  . Smokeless tobacco: Never Used  . Alcohol Use: No    Prior to Admission medications   Medication Sig Start Date End Date Taking? Authorizing Provider  allopurinol (ZYLOPRIM) 300 MG tablet Take 150 mg by mouth daily.   Yes Historical Provider, MD  aspirin 325 MG tablet Take 325 mg by mouth every evening.    Yes Historical Provider, MD  atenolol (TENORMIN) 25 MG tablet Take 25 mg by mouth daily.    Yes Historical Provider,  MD  beta carotene w/minerals (OCUVITE) tablet Take 1 tablet by mouth daily.   Yes Historical Provider, MD  cholecalciferol (VITAMIN D) 1000 UNITS tablet Take 1,000 Units by mouth daily.   Yes Historical Provider, MD  Cyanocobalamin (VITAMIN B-12 IJ) Inject 1 Syringe as directed every 30 (thirty) days. As directed. Due 08/19/2013   Yes Historical Provider, MD  cyclobenzaprine (FLEXERIL) 10 MG tablet Take 5 mg by mouth at bedtime.   Yes Historical Provider, MD  ferrous sulfate 325 (65 FE) MG tablet Take 325 mg by mouth daily with breakfast.   Yes Historical Provider, MD  furosemide (LASIX) 40 MG tablet Take 40 mg by mouth daily.   Yes Historical Provider, MD  glipiZIDE (GLUCOTROL XL) 10 MG 24 hr tablet Take 10 mg by mouth daily.   Yes Historical Provider, MD  lansoprazole  (PREVACID) 30 MG capsule Take 30 mg by mouth daily. Before a meal once a day   Yes Historical Provider, MD  primidone (MYSOLINE) 50 MG tablet Take 50 mg by mouth 2 (two) times daily.   Yes Historical Provider, MD    Current Facility-Administered Medications  Medication Dose Route Frequency Provider Last Rate Last Dose  . 0.9 %  sodium chloride infusion   Intravenous STAT Julianne Rice, MD 75 mL/hr at 08/07/13 0334    . 0.9 %  sodium chloride infusion   Intravenous STAT Julianne Rice, MD 125 mL/hr at 08/07/13 0600    . 0.9 %  sodium chloride infusion  250 mL Intravenous PRN Alric Quan, MD      . 0.9 %  sodium chloride infusion   Intravenous Continuous Alric Quan, MD      . ipratropium-albuterol (DUONEB) 0.5-2.5 (3) MG/3ML nebulizer solution 3 mL  3 mL Nebulization Q2H PRN Alric Quan, MD      . ondansetron [facility]) injection 4 mg  4 mg Intravenous Q8H PRN Julianne Rice, MD   4 mg at 08/07/13 0411  . pantoprazole (PROTONIX) injection 40 mg  40 mg Intravenous QHS Alric Quan, MD        Allergies as of 08/07/2013 - Review Complete 08/07/2013  Allergen Reaction Noted  . Penicillins    . Sulfonamide derivatives    . Celebrex [celecoxib] Rash 12/18/2011     Review of Systems:    This is positive for those things mentioned in the HPI, also positive for arm pains, weakness. All other review of systems are negative.    Physical Exam:  Vital signs in last 24 hours: Temp:  [97.3 F (36.3 C)-97.7 F (36.5 C)] 97.3 F (36.3 C) (01/04 0500) Pulse Rate:  [68-97] 80 (01/04 0700) Resp:  [9-22] 18 (01/04 0700) BP: (96-133)/(43-88) 102/86 mmHg (01/04 0700) SpO2:  [100 %] 100 % (01/04 0700) Weight:  [143 lb 11.8 oz (65.2 kg)] 143 lb 11.8 oz (65.2 kg) (01/04 0500) Last BM Date: 08/07/13  General:  Well-developed, well-nourished elderly woman, acutely ill, not toxic Eyes:  Anicteric with pale conjuctiva ENT:   Mouth and posterior pharynx free of lesions. Pale  mucosa Neck:   supple w/o thyromegaly or mass.  Lungs: Clear to auscultation anterior Heart:  S1S2, no rubs, murmurs, gallops.- distant sounds Abdomen:  soft, non-tender, no hepatosplenomegaly, hernia, or mass and BS+. No bruits Rectal: Not done - obvious bleeding reported Lymph:  no cervical or supraclavicular adenopathy. Extremities:   no edema Skin   some crusted abrasions on pre-tibial areas Neuro:  A&O x 3.  Psych:  appropriate mood and  Affect.  Data Reviewed:   LAB RESULTS:  Recent Labs  08/07/13 0130 08/07/13 0240  WBC 11.3*  --   HGB 9.3* 7.1*  HCT 28.1* 21.1*  PLT 212  --    BMET  Recent Labs  08/07/13 0130  NA 137  K 4.6  CL 99  CO2 26  GLUCOSE 192*  BUN 60*  CREATININE 2.51*  CALCIUM 8.0*   LFT  Recent Labs  08/07/13 0130  PROT 6.3  ALBUMIN 2.9*  AST 13  ALT 15  ALKPHOS 77  BILITOT <0.2*   PT/INR  Recent Labs  08/07/13 0130  LABPROT 14.3  INR 1.13    PREVIOUS ENDOSCOPIES:            Colonoscopy about 5 years ago - "routine, no polyps" - she thinks diverticulosis mentioned   Thanks   LOS: 0 days   @Carl  Simonne Maffucci, MD, [facility] @  08/07/2013, 7:26 AM

## 2013-08-09 ENCOUNTER — Encounter (HOSPITAL_COMMUNITY): Payer: Self-pay | Admitting: Gastroenterology

## 2013-08-09 DIAGNOSIS — I10 Essential (primary) hypertension: Secondary | ICD-10-CM

## 2013-08-09 DIAGNOSIS — K5730 Diverticulosis of large intestine without perforation or abscess without bleeding: Secondary | ICD-10-CM

## 2013-08-09 DIAGNOSIS — K219 Gastro-esophageal reflux disease without esophagitis: Secondary | ICD-10-CM

## 2013-08-09 LAB — BASIC METABOLIC PANEL
BUN: 29 mg/dL — ABNORMAL HIGH (ref 6–23)
CALCIUM: 7.6 mg/dL — AB (ref 8.4–10.5)
CO2: 19 mEq/L (ref 19–32)
CREATININE: 1.73 mg/dL — AB (ref 0.50–1.10)
Chloride: 111 mEq/L (ref 96–112)
GFR calc Af Amer: 31 mL/min — ABNORMAL LOW (ref >90)
GFR calc non Af Amer: 27 mL/min — ABNORMAL LOW (ref >90)
GLUCOSE: 136 mg/dL — AB (ref 70–99)
Potassium: 4.7 mEq/L (ref 3.7–5.3)
Sodium: 142 mEq/L (ref 137–147)

## 2013-08-09 LAB — CBC
HCT: 19.1 % — ABNORMAL LOW (ref 36.0–46.0)
HCT: 23.1 % — ABNORMAL LOW (ref 36.0–46.0)
Hemoglobin: 6.7 g/dL — CL (ref 12.0–15.0)
Hemoglobin: 8.1 g/dL — ABNORMAL LOW (ref 12.0–15.0)
MCH: 31.2 pg (ref 26.0–34.0)
MCH: 31 pg (ref 26.0–34.0)
MCHC: 35.1 g/dL (ref 30.0–36.0)
MCHC: 35.1 g/dL (ref 30.0–36.0)
MCV: 88.8 fL (ref 78.0–100.0)
MCV: 88.5 fL (ref 78.0–100.0)
PLATELETS: 112 10*3/uL — AB (ref 150–400)
Platelets: 120 10*3/uL — ABNORMAL LOW (ref 150–400)
RBC: 2.15 MIL/uL — ABNORMAL LOW (ref 3.87–5.11)
RBC: 2.61 MIL/uL — AB (ref 3.87–5.11)
RDW: 18.2 % — AB (ref 11.5–15.5)
RDW: 17 % — ABNORMAL HIGH (ref 11.5–15.5)
WBC: 10.9 10*3/uL — ABNORMAL HIGH (ref 4.0–10.5)
WBC: 10.4 10*3/uL (ref 4.0–10.5)

## 2013-08-09 LAB — GLUCOSE, CAPILLARY
GLUCOSE-CAPILLARY: 97 mg/dL (ref 70–99)
GLUCOSE-CAPILLARY: 131 mg/dL — AB (ref 70–99)
Glucose-Capillary: 161 mg/dL — ABNORMAL HIGH (ref 70–99)
Glucose-Capillary: 147 mg/dL — ABNORMAL HIGH (ref 70–99)

## 2013-08-09 LAB — PREPARE RBC (CROSSMATCH)

## 2013-08-09 MED ORDER — ONDANSETRON HCL 4 MG/2ML IJ SOLN: 4.0000 mg | Freq: Four times a day (QID) | INTRAMUSCULAR | Status: DC

## 2013-08-09 MED ORDER — ONDANSETRON HCL 4 MG/2ML IJ SOLN
INTRAMUSCULAR | Status: AC
  Administered 2013-08-09: 07:00:00 4 mg via INTRAVENOUS
  Filled 2013-08-09: qty 2

## 2013-08-09 MED ORDER — ONDANSETRON HCL 4 MG/2ML IJ SOLN
4.0000 mg | Freq: Four times a day (QID) | INTRAMUSCULAR | Status: DC | PRN
  Administered 2013-08-09: 4 mg via INTRAVENOUS

## 2013-08-09 NOTE — Progress Notes (Signed)
Transferred to [place] [facility] via wheelchair. Portable monitor and oxygen on. Report given to Rodena Piety, Therapist, sports. No changes.

## 2013-08-09 NOTE — Progress Notes (Signed)
eLink Physician-Brief Progress Note Patient Name: Susan Mccoy DOB: 11/10/1931 MRN: [id number]  Date of Service  08/09/2013   HPI/Events of Note  GIB with Hgb drop from 8.4 to 6.7.  HD stable   eICU Interventions  Plan: Transfuse 1 u pRBC Post-txf CBC   Intervention Category Intermediate Interventions: Bleeding - evaluation and treatment with blood products  Esme Freund 08/09/2013, 4:59 AM

## 2013-08-09 NOTE — Progress Notes (Signed)
PULMONARY / CRITICAL CARE MEDICINE  Name: Susan Mccoy MRN: [id number] DOB: 06-18-1932    ADMISSION DATE:  08/07/2013 CONSULTATION DATE: 08/07/2013   REFERRING MD :  EDP PRIMARY SERVICE: PCCM  CHIEF COMPLAINT:  Hematochezia  BRIEF PATIENT DESCRIPTION: 80 yo on Aspirin with history of diverticulosis admitted with multiple bouts of hemathochezia and vagal episodes, Hb fell more than 2 g acutely, s/p 2 U PRBC transfusion.  SIGNIFICANT EVENTS / STUDIES:  1/5 TTE >>> EF 65%, no RWMA, PAP 53 torr 1/5 Colonoscopy >>> There was no blood in the colon (old or new). There were numerous diverticulum in the left colon. There were medium sized external hemorrhoids. The examination was otherwise normal. Likely this was bleeding from colonicdiverticulosis, has resolved as is usually the case.  LINES / TUBES:  CULTURES:   ANTIBIOTICS:  INTERVAL HISTORY: Hb decreased to 6.7. Transfused 1 unit PRBC.    VITAL SIGNS: Temp:  [97.6 F (36.4 C)-98.7 F (37.1 C)] 98.2 F (36.8 C) (01/06 0900) Pulse Rate:  [74-115] 88 (01/06 0900) Resp:  [17-34] 24 (01/06 0900) BP: (104-160)/(41-99) 143/60 mmHg (01/06 0800) SpO2:  [77 %-100 %] 100 % (01/06 0900) Weight:  [64.728 kg (142 lb 11.2 oz)] 64.728 kg (142 lb 11.2 oz) (01/06 0630)  HEMODYNAMICS:   VENTILATOR SETTINGS:   INTAKE / OUTPUT: Intake/Output     01/05 0701 - 01/06 0700 01/06 0701 - 01/07 0700   P.O. 240    I.V. (mL/kg) 1356 (20.9)    Blood 24.2    IV Piggyback     Total Intake(mL/kg) 1620.2 (25)    Urine (mL/kg/hr) 2650 (1.7)    Stool     Total Output 2650     Net -1029.8          Urine Occurrence 2 x    Stool Occurrence 2 x      PHYSICAL EXAMINATION: General:  Resting comfortable Neuro:  Awake, alert HEENT:  PERRL Cardiovascular:  Regular, no murmurs Lungs:  CTAB Abdomen:  Obese, soft, non tender, bowel sounds present Musculoskeletal:  Moves all extremities, no edema Skin:  No rash  LABS:  CBC  Recent Labs Lab  08/07/13 0750  08/07/13 2158 08/08/13 0635 08/09/13 0405  WBC 13.0*  --   --  12.1* 10.9*  HGB 8.9*  < > 6.2* 8.4* 6.7*  HCT 25.8*  < > 17.6* 23.6* 19.1*  PLT 146*  --   --  122* 112*  < > = values in this interval not displayed. Coag's  Recent Labs Lab 08/07/13 0130  APTT 28  INR 1.13   BMET  Recent Labs Lab 08/07/13 0750 08/08/13 0635 08/09/13 0405  NA 140 147 142  K 5.9* 4.9 4.7  CL 112 116* 111  CO2 16* 18* 19  BUN 52* 39* 29*  CREATININE 2.01* 1.95* 1.73*  GLUCOSE 267* 166* 136*   Electrolytes  Recent Labs Lab 08/07/13 0750 08/08/13 0635 08/09/13 0405  CALCIUM 6.2* 7.1* 7.6*  MG 1.9 1.8  --   PHOS 4.4 3.2  --    Sepsis Markers  Recent Labs Lab 08/07/13 0750  LATICACIDVEN 2.8*   ABG No results found for this basename: PHART, PCO2ART, PO2ART,  in the last 168 hours Liver Enzymes  Recent Labs Lab 08/07/13 0130  AST 13  ALT 15  ALKPHOS 77  BILITOT <0.2*  ALBUMIN 2.9*   Cardiac Enzymes  Recent Labs Lab 08/07/13 0130 08/07/13 0750  TROPONINI <0.30 <0.30   Glucose  Recent Labs Lab  08/07/13 2206 08/08/13 0727 08/08/13 1138 08/08/13 1522 08/08/13 2258 08/09/13 0727  GLUCAP 123* 137* 95 83 161* 147*   CXR:  None today  ASSESSMENT / PLAN:  PULMONARY A:  H/o pulmonary fibrosis, previous smoker.  P:   Goal SpO2>92 Supplemental oxygen PRN Albuterol / Atrovent PRN  CARDIOVASCULAR A:  Hypotensive episode, likely vagal - resolved. H/o heart failure, hypertension and dyslipidemia. P:  Hold  ASA Atenolol  RENAL A: Acute on chronic renal failure. P:   Trend BMP Hold Lasix D/c IVF  GASTROINTESTINAL A:   Acute lower GI hemorrhage, suspected diverticular. Hemorrhoids. H/o GERD on PPI. P:   NPO for procedure GI consulted Protonix  HEMATOLOGIC A:   Acute anemia due to GI hemorrhage. P:  Goal Hb>7 Trend CBC q6h  INFECTIOUS A:  No evidence of infection for now P:   Incentive spirometry to minimize chance of  PNA  ENDOCRINE A:   DM. Hyperlipidemia. P:   SSI Hold Glipizide  NEUROLOGIC A: No active issues. P:   Flexeril, Primidone  Transfer to telemetry. PCCM will sign off. TRH to assume care 1/7.  I have personally obtained history, examined patient, evaluated and interpreted laboratory and imaging results, reviewed medical records, formulated assessment / plan and placed orders.  Doree Fudge, MD Pulmonary and [place] Pager: [phone]  08/09/2013, 9:29 AM

## 2013-08-09 NOTE — Progress Notes (Signed)
CRITICAL VALUE ALERT  Critical value received:  Hgb 6.7  Date of notification:  08/09/13  Time of notification:  0500  Critical value read back:yes  Nurse who received alert:  Candyce Churn RN  MD notified (1st page):  Dr. Jimmy Footman (called to [place])  Time of first page:  0457, order received  MD notified (2nd page):  Time of second page:  Responding MD:  n/a  Time MD responded:  n/a

## 2013-08-10 LAB — TYPE AND SCREEN
ABO/RH(D): A POS
Antibody Screen: NEGATIVE
UNIT DIVISION: 0
Unit division: 0
Unit division: 0
Unit division: 0

## 2013-08-10 LAB — HEMOGLOBIN AND HEMATOCRIT, BLOOD
HCT: 25.2 % — ABNORMAL LOW (ref 36.0–46.0)
Hemoglobin: 8.7 g/dL — ABNORMAL LOW (ref 12.0–15.0)

## 2013-08-10 LAB — GLUCOSE, CAPILLARY
Glucose-Capillary: 135 mg/dL — ABNORMAL HIGH (ref 70–99)
Glucose-Capillary: 138 mg/dL — ABNORMAL HIGH (ref 70–99)

## 2013-08-10 MED ORDER — PANTOPRAZOLE SODIUM 40 MG PO TBEC
40.0000 mg | DELAYED_RELEASE_TABLET | Freq: Every day | ORAL | Status: DC
  Administered 2013-08-10: 40 mg via ORAL
  Filled 2013-08-10: qty 1

## 2013-08-10 NOTE — Evaluation (Signed)
Physical Therapy Evaluation Patient Details Name: Susan Mccoy MRN: [id number] DOB: 11-15-1931 Today's Date: 08/10/2013 Time: 1050-1140 PT Time Calculation (min): 50 min  PT Assessment / Plan / Recommendation History of Present Illness  Patientis is a 80 y/o female admitted with GI bleed.  Clinical Impression  Patient presents with decreased independence and safety with mobility due to deficits listed below.  Currently MD states she is medically ready for d/c.  Concern for her ability to mobilize and provide for the family meals and further IADL's due to her recent immobility (reports in bed since Sat evening,) and reports of leg weakness shared with patient and her friend in the room.  Friend states she can provide at least daytime help for her and her spouse until pt improves strength and capability to care for the household needs.  Will benefit as well from [place].  Will provide acute level PT until d/c.    PT Assessment  Patient needs continued PT services    Follow Up Recommendations  Home health PT;Supervision - Intermittent    Does the patient have the potential to tolerate intense rehabilitation    N/A  Barriers to Discharge  None      Equipment Recommendations  None recommended by PT    Recommendations for Other Services   NOne  Frequency Min 3X/week    Precautions / Restrictions Precautions Precautions: Fall Precaution Comments: reports h/o vertigo, no falls recently Restrictions Weight Bearing Restrictions: No   Pertinent Vitals/Pain No pain; SpO2 in RA 88%, 2L O2 95%. (has home O2)      Mobility  Bed Mobility not tested due to pt in chair Transfers sit to stand minguard from chair for safety supervision from toilet Ambulation/Gait Ambulated with rolling walker supervision with decreased stride length, decreased speed (below normal for age/gender with fall risk,) and shuffle pattern. Ambulation Distance (Feet): 120 Feet    Exercises     PT Diagnosis:  Generalized weakness;Abnormality of gait  PT Problem List: Decreased strength;Decreased activity tolerance;Decreased balance;Decreased mobility;Decreased safety awareness PT Treatment Interventions: DME instruction;Balance training;Gait training;Stair training;Functional mobility training;Patient/family education;Therapeutic activities;Therapeutic exercise     PT Goals(Current goals can be found in the care plan section) Acute Rehab PT Goals Patient Stated Goal: To go home today PT Goal Formulation: With patient Time For Goal Achievement: 08/17/13 Potential to Achieve Goals: Good  Visit Information  Last PT Received On: 08/10/13 Assistance Needed: +1 History of Present Illness: Patientis is a 80 y/o female admitted with GI bleed.       Prior [place] expects to be discharged to:: Private residence Living Arrangements: Spouse/significant other Available Help at Discharge: Friend(s);Available PRN/intermittently (has housekeeper and a friend takes her for appointments) Type of Home: House Home Access: Stairs to enter [employer] of Steps: 2-3 Entrance Stairs-Rails: Can reach both;Right;Left Home Layout: One level Home Equipment: Walker - 2 wheels;Shower seat;Grab bars - tub/shower;Grab bars - toilet;Hand held shower head Additional Comments: spouse has had stroke and cannot assist; handicapped commode Prior Function Level of Independence: Independent with assistive device(s) Communication Communication: No difficulties    Cognition  Cognition Arousal/Alertness: Awake/alert Behavior During Therapy: WFL for tasks assessed/performed Overall Cognitive Status: Within Functional Limits for tasks assessed    Extremity/Trunk Assessment Lower Extremity Assessment Lower Extremity Assessment: Generalized weakness   Balance  Standing balance with supervision during toilet hygiene and washing hands at sink  Demonstrated decreased safety with  washing hands from side of sink with increased distance to sink.  Fair rating on Leahy scale (stands unsupported but cannot accept challenge)  End of Session PT - End of Session Equipment Utilized During Treatment: Oxygen Activity Tolerance: Patient tolerated treatment well Patient left: in chair;with call bell/phone within reach;with family/visitor present  GP     [facility] 08/10/2013, 11:57 AM Magda Kiel, [place] 08/10/2013

## 2013-08-10 NOTE — Progress Notes (Signed)
Discussed discharge instructions and medications with pt. Pt showed no barriers to discharge. IV removed. Tele removed. Pt discharged to home with friend. Assessment unchanged from morning.

## 2013-08-10 NOTE — Discharge Summary (Signed)
PATIENT DETAILS Name: Susan Mccoy Age: 80 y.o. Sex: female Date of Birth: September 11, 1931 MRN: [id number]. Admit Date: 08/07/2013 Admitting Physician: Rush Farmer, MD TWS:[id number],XNTZGY NEVILL, MD  Recommendations for Outpatient Follow-up:  1. CBC in one week.  PRIMARY DISCHARGE DIAGNOSIS:  Active Problems:   Acute lower GI bleeding   Chronic renal disease, stage 4, severely decreased glomerular filtration rate between 15-29 mL/min/1.73 square meter   Acute posthemorrhagic anemia   Hypotension due to blood loss   Diverticulosis of colon (without mention of hemorrhage)      PAST MEDICAL HISTORY: Past Medical History  Diagnosis Date  . Hypertension   . Thyroid disease   . Abnormal LFTs     Fatty liver by [place]  . Hypercholesterolemia   . Carpal tunnel syndrome   . DJD (degenerative joint disease)   . Shingles   . Meralgia paraesthetica   . GERD (gastroesophageal reflux disease)   . Pulmonary fibrosis   . Hemorrhoids   . Diverticulosis   . Normocytic normochromic anemia   . History of recurrent TIAs   . UTI (lower urinary tract infection)   . Complication of anesthesia     trouble with putting her to sleep  . CHF (congestive heart failure)   . Chronic renal disease, stage 4, severely decreased glomerular filtration rate between 15-29 mL/min/1.73 square meter   . Anemia of chronic kidney failure     DISCHARGE MEDICATIONS:   Medication List    STOP taking these medications       aspirin 325 MG tablet      TAKE these medications       allopurinol 300 MG tablet  Commonly known as:  ZYLOPRIM  Take 150 mg by mouth daily.     atenolol 25 MG tablet  Commonly known as:  TENORMIN  Take 25 mg by mouth daily.     beta carotene w/minerals tablet  Take 1 tablet by mouth daily.     cholecalciferol 1000 UNITS tablet  Commonly known as:  VITAMIN D  Take 1,000 Units by mouth daily.     cyclobenzaprine 10 MG tablet  Commonly known as:  FLEXERIL  Take 5 mg by mouth at  bedtime.     ferrous sulfate 325 (65 FE) MG tablet  Take 325 mg by mouth daily with breakfast.     furosemide 40 MG tablet  Commonly known as:  LASIX  Take 40 mg by mouth daily.     glipiZIDE 10 MG 24 hr tablet  Commonly known as:  GLUCOTROL XL  Take 10 mg by mouth daily.     lansoprazole 30 MG capsule  Commonly known as:  PREVACID  Take 30 mg by mouth daily. Before a meal once a day     primidone 50 MG tablet  Commonly known as:  MYSOLINE  Take 50 mg by mouth 2 (two) times daily.     VITAMIN B-12 IJ  Inject 1 Syringe as directed every 30 (thirty) days. As directed. Due 08/19/2013        ALLERGIES:   Allergies  Allergen Reactions  . Penicillins     REACTION: GI Upset  . Sulfonamide Derivatives     Unknown. Symptoms were when she was a teenager  . Celebrex [Celecoxib] Rash    BRIEF HPI:  See H&P, Labs, Consult and Test reports for all details in brief, patient is a 80 year old Caucasian female with a history of hypertension, who presented to the hospital on 1/4 complaining of hematochezia.  She was admitted by critical care medicine after she had a vasovagal episode in the ED.  CONSULTATIONS:   pulmonary/intensive care and GI  PERTINENT RADIOLOGIC STUDIES: Dg Chest 1 View  08/07/2013   CLINICAL DATA:  Bibasilar rales.  History of pulmonary fibrosis.  EXAM: CHEST - 1 VIEW  COMPARISON:  07/22/2012 and 01/05/2012.  FINDINGS: 0739 hr. The heart size and mediastinal contours are stable. There is atherosclerosis of the aortic arch. Pulmonary fibrotic changes and asymmetric left basilar pulmonary opacity are unchanged. There is no evidence of superimposed airspace disease, edema or significant pleural effusion.  IMPRESSION: Stable chronic findings of pulmonary fibrosis with asymmetric left basilar involvement. No acute superimposed process identified.   Electronically Signed   By: Camie Patience M.D.   On: 08/07/2013 10:35     PERTINENT LAB RESULTS: CBC:  Recent Labs   08/09/13 0405 08/09/13 1000 08/10/13 0916  WBC 10.9* 10.4  --   HGB 6.7* 8.1* 8.7*  HCT 19.1* 23.1* 25.2*  PLT 112* 120*  --    CMET CMP     Component Value Date/Time   NA 142 08/09/2013 0405   K 4.7 08/09/2013 0405   CL 111 08/09/2013 0405   CO2 19 08/09/2013 0405   GLUCOSE 136* 08/09/2013 0405   BUN 29* 08/09/2013 0405   CREATININE 1.73* 08/09/2013 0405   CALCIUM 7.6* 08/09/2013 0405   PROT 6.3 08/07/2013 0130   ALBUMIN 2.9* 08/07/2013 0130   AST 13 08/07/2013 0130   ALT 15 08/07/2013 0130   ALKPHOS 77 08/07/2013 0130   BILITOT <0.2* 08/07/2013 0130   GFRNONAA 27* 08/09/2013 0405   GFRAA 31* 08/09/2013 0405    GFR Estimated Creatinine Clearance: 23.6 ml/min (by C-G formula based on Cr of 1.73). No results found for this basename: LIPASE, AMYLASE,  in the last 72 hours No results found for this basename: CKTOTAL, CKMB, CKMBINDEX, TROPONINI,  in the last 72 hours No components found with this basename: POCBNP,  No results found for this basename: DDIMER,  in the last 72 hours No results found for this basename: HGBA1C,  in the last 72 hours No results found for this basename: CHOL, HDL, LDLCALC, TRIG, CHOLHDL, LDLDIRECT,  in the last 72 hours No results found for this basename: TSH, T4TOTAL, FREET3, T3FREE, THYROIDAB,  in the last 72 hours No results found for this basename: VITAMINB12, FOLATE, FERRITIN, TIBC, IRON, RETICCTPCT,  in the last 72 hours Coags: No results found for this basename: PT, INR,  in the last 72 hours Microbiology: Recent Results (from the past 240 hour(s))  MRSA PCR SCREENING     Status: Abnormal   Collection Time    08/07/13  5:12 AM      Result Value Range Status   MRSA by PCR POSITIVE (*) NEGATIVE Final   Comment:            The GeneXpert MRSA Assay (FDA     approved for NASAL specimens     only), is one component of a     comprehensive MRSA colonization     surveillance program. It is not     intended to diagnose MRSA     infection nor to guide or     monitor  treatment for     MRSA infections.     RESULT CALLED TO, READ BACK BY AND VERIFIED WITH:     COBLE,J RN [phone] AT [place] COURSE:   Active Problems:  Acute lower GI bleeding - This was presumed to be diverticular in etiology. Because the patient had a vasovagal episode, patient was initially admitted by the critical care service to the intensive care unit. Gastroenterology was consulted. Colonoscopy was done 1/5 which showed old blood in the colon. He was also numerous diverticulosis in the left colon. Per GI, this is likely a diverticular bleed. Fortunately, patient has had no further bleeding since admission. This morning, patient is requesting that she be discharged home, she was complaining that the hospital that was very uncomfortable and she was tired and did not have a good night sleep. She is being discharged at her own request, fortunately she has had no bleeding for more than 48 hours. During this hospital stay, she did require one unit of PRBC admission on 08/09/13. I've asked her not to take the aspirin till she is seen by her primary care  Acute blood loss anemia - Baseline hemoglobin around 10, on admission it was 9.3, however it dropped quickly to 7.1. Patient think he has stopped bleeding, her last DTs he was on the day of admission. She was transfused 1 unit of PRBC. Her hemoglobin on day of discharge is 8.7. She will continue on iron supplementation, a followup CBC needs to be obtained when she follows up with her PCP in 1 week.  Hypertension - Stable, continue with Lasix and atenolol.  History of chronic kidney disease stage IV - Creatinine stable in close to baseline.   TODAY-DAY OF DISCHARGE:  Subjective:   Valentino Nose today has no headache,no chest abdominal pain,no new weakness tingling or numbness, feels much better wants to go home today.  Objective:   Blood pressure 165/82, pulse 87, temperature 98.4 F (36.9 C), temperature source  Oral, resp. rate 16, height 5\' 2"  (1.575 m), weight 66.8 kg (147 lb 4.3 oz), SpO2 95.00%.  Intake/Output Summary (Last 24 hours) at 08/10/13 1142 Last data filed at 08/10/13 0924  Gross per 24 hour  Intake    670 ml  Output   1300 ml  Net   -630 ml   Filed Weights   08/08/13 0500 08/09/13 0630 08/09/13 2054  Weight: 66.996 kg (147 lb 11.2 oz) 64.728 kg (142 lb 11.2 oz) 66.8 kg (147 lb 4.3 oz)    Exam Awake Alert, Oriented *3, No new F.N deficits, Normal affect [place].AT,PERRAL Supple Neck,No JVD, No cervical lymphadenopathy appriciated.  Symmetrical Chest wall movement, Good air movement bilaterally, CTAB RRR,No Gallops,Rubs or new Murmurs, No Parasternal Heave +ve B.Sounds, Abd Soft, Non tender, No organomegaly appriciated, No rebound -guarding or rigidity. No Cyanosis, Clubbing or edema, No new Rash or bruise  DISCHARGE CONDITION: Stable  DISPOSITION: Home with home health services  DISCHARGE INSTRUCTIONS:    Activity:  As tolerated with Full fall precautions use walker/cane & assistance as needed  Diet recommendation: Diabetic Diet Heart Healthy diet      Discharge Orders   Future Appointments Provider Department Dept Phone   10/17/2013 10:45 AM Kendell Bane, [place] [facility] [place]   Future Orders Complete By Expires   Call MD for:  As directed    Scheduling Instructions:     Rectal bleeding   Diet - low sodium heart healthy  As directed    Diet Carb Modified  As directed    Increase activity slowly  As directed       Follow-up Information   Follow up with GATES,ROBERT NEVILL, MD. Schedule an appointment as soon as  possible for a visit in 1 week. (Please ask your PCP to check CBC at next visit)    Specialty:  Internal Medicine   Contact information:   [address] [place] [address] [phone]      Total Time spent on discharge equals 45 minutes.  SignedOren Binet 08/10/2013 11:42 AM

## 2013-08-10 NOTE — Progress Notes (Signed)
   CARE MANAGEMENT NOTE 08/10/2013  Patient:  Susan Mccoy, Susan Mccoy   Account Number:  [id number]  Date Initiated:  08/10/2013  Documentation initiated by:  Lizabeth Leyden  Subjective/Objective Assessment:   admitted with GI bleed     Action/Plan:   home health RN, PT   Anticipated DC Date:  08/10/2013   Anticipated DC Plan:  [place]  CM consult      [facility] Choice  HOME HEALTH   Choice offered to / List presented to:  C-1 Patient        [place] arranged  HH-1 RN  [place] PT      [facility] agency  [facility]   Status of service:  Completed, signed off Medicare Important Message given?   (If response is "NO", the following Medicare IM given date fields will be blank) Date Medicare IM given:   Date Additional Medicare IM given:    Discharge Disposition:  [place]  Per UR Regulation:    If discussed at Long Length of Stay Meetings, dates discussed:    Comments:  08/10/2013  [place], [place] CM referral: home health RN, PT  Met with patient to discuss discharge planning and home health services.  Explained to her the importance of HHRN to monitor her medical status  and HHPT to increase her endurance and safe ambulation at home. She agreed to home health and selected [place].  [facility] called with referral.

## 2013-08-11 LAB — GLUCOSE, CAPILLARY: Glucose-Capillary: 162 mg/dL — ABNORMAL HIGH (ref 70–99)

## 2013-10-17 ENCOUNTER — Encounter: Payer: Self-pay | Admitting: Podiatry

## 2013-10-17 ENCOUNTER — Ambulatory Visit (INDEPENDENT_AMBULATORY_CARE_PROVIDER_SITE_OTHER): Payer: Medicare Other | Admitting: Podiatry

## 2013-10-17 VITALS — BP 117/56 | HR 70 | Resp 18

## 2013-10-17 DIAGNOSIS — M79609 Pain in unspecified limb: Secondary | ICD-10-CM

## 2013-10-17 DIAGNOSIS — B351 Tinea unguium: Secondary | ICD-10-CM

## 2013-10-18 NOTE — Progress Notes (Signed)
Patient ID: Susan Mccoy, female   DOB: 10-27-1931, 80 y.o.   MRN: [id number]  Subjective: This 80 year old diabetic female presents for ongoing debridement of painful mycotic toenails.  Objective: Elongated, hypertrophic, brittle toenails with palpable tenderness in all nail plates  Assessment: Symptomatic onychomycoses x10  Plan: Nails x10 debrided back without a bleeding. Reappoint at three-month intervals

## 2013-12-30 ENCOUNTER — Other Ambulatory Visit: Payer: Self-pay | Admitting: Internal Medicine

## 2014-01-05 ENCOUNTER — Encounter: Payer: Self-pay | Admitting: Podiatry

## 2014-01-05 ENCOUNTER — Ambulatory Visit (INDEPENDENT_AMBULATORY_CARE_PROVIDER_SITE_OTHER): Payer: Medicare Other | Admitting: Podiatry

## 2014-01-05 VITALS — BP 185/95 | HR 82 | Resp 16

## 2014-01-05 DIAGNOSIS — M79609 Pain in unspecified limb: Secondary | ICD-10-CM

## 2014-01-05 DIAGNOSIS — B351 Tinea unguium: Secondary | ICD-10-CM

## 2014-01-05 NOTE — Progress Notes (Signed)
   Subjective:    Patient ID: Susan Mccoy, female    DOB: May 25, 1932, 80 y.o.   MRN: [id number]  HPI Debride, no other issues   Review of Systems     Objective:   Physical Exam        Assessment & Plan:

## 2014-01-06 NOTE — Progress Notes (Signed)
Subjective:     Patient ID: Susan Mccoy, female   DOB: 01/31/1932, 80 y.o.   MRN: [id number]  HPI patient presents stating all the nails on my feet get thick and I cannot cut them myself and painful   Review of Systems     Objective:   Physical Exam Neurovascular status intact with thick yellow brittle nailbeds 1-5 both feet with pain    Assessment:     Mycotic nail infection 1-5 both feet with pain    Plan:     Debridement painful nailbeds 1-5 both feet

## 2014-01-16 ENCOUNTER — Ambulatory Visit: Payer: BLUE CROSS/BLUE SHIELD | Admitting: Podiatry

## 2014-01-17 ENCOUNTER — Other Ambulatory Visit: Payer: Self-pay | Admitting: Dermatology

## 2014-03-08 ENCOUNTER — Ambulatory Visit (INDEPENDENT_AMBULATORY_CARE_PROVIDER_SITE_OTHER): Payer: Medicare Other | Admitting: Podiatry

## 2014-03-08 VITALS — BP 154/86 | HR 69 | Resp 12

## 2014-03-08 DIAGNOSIS — M79609 Pain in unspecified limb: Secondary | ICD-10-CM

## 2014-03-08 DIAGNOSIS — M79673 Pain in unspecified foot: Secondary | ICD-10-CM

## 2014-03-08 DIAGNOSIS — B351 Tinea unguium: Secondary | ICD-10-CM

## 2014-03-09 NOTE — Progress Notes (Signed)
Patient ID: Susan Mccoy, female   DOB: 04-Jun-1932, 80 y.o.   MRN: [id number]  Subjective: This patient presents complaining of painful toenails  Objective: Elongated, hypertrophic, incurvated, toenails 6-10  Assessment: Symptomatic onychomycoses 6-10  Plan: Debrided toenails x10 without bleeding  Reappoint x3 months

## 2014-05-24 ENCOUNTER — Encounter: Payer: Self-pay | Admitting: Podiatry

## 2014-05-24 ENCOUNTER — Ambulatory Visit (INDEPENDENT_AMBULATORY_CARE_PROVIDER_SITE_OTHER): Payer: Medicare Other | Admitting: Podiatry

## 2014-05-24 VITALS — BP 153/77 | HR 74 | Resp 12

## 2014-05-24 DIAGNOSIS — M79676 Pain in unspecified toe(s): Secondary | ICD-10-CM

## 2014-05-24 DIAGNOSIS — B351 Tinea unguium: Secondary | ICD-10-CM

## 2014-05-24 NOTE — Progress Notes (Signed)
Patient ID: Susan Mccoy, female   DOB: [birth year], 80 y.o.   MRN: [id number]  Subjective: This patient presents again complaining of painful toenails when walking wearing shoes  Objective: The toenails are elongated, incurvated, discolored 6-10  Assessment: Symptomatic onychomycoses 6-10  Plan: Debrided toenails x10 without a bleeding  Reappoint x3 months

## 2014-05-31 ENCOUNTER — Ambulatory Visit: Payer: Medicare Other | Attending: Internal Medicine

## 2014-05-31 DIAGNOSIS — R262 Difficulty in walking, not elsewhere classified: Secondary | ICD-10-CM | POA: Insufficient documentation

## 2014-05-31 DIAGNOSIS — M6281 Muscle weakness (generalized): Secondary | ICD-10-CM | POA: Insufficient documentation

## 2014-05-31 DIAGNOSIS — Z5189 Encounter for other specified aftercare: Secondary | ICD-10-CM | POA: Insufficient documentation

## 2014-05-31 DIAGNOSIS — R270 Ataxia, unspecified: Secondary | ICD-10-CM | POA: Diagnosis not present

## 2014-05-31 DIAGNOSIS — E1140 Type 2 diabetes mellitus with diabetic neuropathy, unspecified: Secondary | ICD-10-CM | POA: Diagnosis not present

## 2014-05-31 DIAGNOSIS — R5381 Other malaise: Secondary | ICD-10-CM | POA: Insufficient documentation

## 2014-06-06 ENCOUNTER — Ambulatory Visit: Payer: Medicare Other | Attending: Podiatry | Admitting: Physical Therapy

## 2014-06-06 DIAGNOSIS — R270 Ataxia, unspecified: Secondary | ICD-10-CM | POA: Insufficient documentation

## 2014-06-06 DIAGNOSIS — R5381 Other malaise: Secondary | ICD-10-CM | POA: Diagnosis not present

## 2014-06-06 DIAGNOSIS — Z5189 Encounter for other specified aftercare: Secondary | ICD-10-CM | POA: Diagnosis present

## 2014-06-06 DIAGNOSIS — R262 Difficulty in walking, not elsewhere classified: Secondary | ICD-10-CM | POA: Diagnosis not present

## 2014-06-06 DIAGNOSIS — M6281 Muscle weakness (generalized): Secondary | ICD-10-CM | POA: Insufficient documentation

## 2014-06-06 DIAGNOSIS — E1140 Type 2 diabetes mellitus with diabetic neuropathy, unspecified: Secondary | ICD-10-CM | POA: Diagnosis not present

## 2014-06-06 DIAGNOSIS — R6889 Other general symptoms and signs: Secondary | ICD-10-CM

## 2014-06-06 NOTE — Therapy (Signed)
Physical Therapy Treatment  Patient Details  Name: Susan Mccoy MRN: [id number] Date of Birth: Feb 01, 1932  Encounter Date: 06/06/2014      PT End of Session - 06/06/14 1434    Visit Number 2   Number of Visits 17   PT Start Time 1660   PT Stop Time 1405   PT Time Calculation (min) 46 min      Past Medical History  Diagnosis Date  . Hypertension   . Thyroid disease   . Abnormal LFTs     Fatty liver by [place]  . Hypercholesterolemia   . Carpal tunnel syndrome   . DJD (degenerative joint disease)   . Shingles   . Meralgia paraesthetica   . GERD (gastroesophageal reflux disease)   . Pulmonary fibrosis   . Hemorrhoids   . Diverticulosis   . Normocytic normochromic anemia   . History of recurrent TIAs   . UTI (lower urinary tract infection)   . Complication of anesthesia     trouble with putting her to sleep  . CHF (congestive heart failure)   . Chronic renal disease, stage 4, severely decreased glomerular filtration rate between 15-29 mL/min/1.73 square meter   . Anemia of chronic kidney failure     Past Surgical History  Procedure Laterality Date  . Back surgery      X 2 - lumbar  . Tonsillectomy    . Cataract extraction, bilateral    . Breast biopsy  B/L- said to be negative for malignancy.  . Colonoscopy    . Colonoscopy N/A 08/08/2013    Procedure: COLONOSCOPY;  Surgeon: [name], MD;  Location: [place];  Service: Endoscopy;  Laterality: N/A;  EGD is possible    There were no vitals taken for this visit.  Visit Diagnosis:  Difficulty walking  Generalized muscle weakness  Activity intolerance        [facility] PT Assessment - 06/06/14 1400    Overall Strength Within functional limits for tasks performed   Sit to Stand Able to stand without using hands and stabilize independently   Standing Unsupported Able to stand safely 2 minutes   Sitting with Back Unsupported but Feet Supported on Floor or Stool Able to sit safely and securely 2 minutes   Stand to Sit Sits safely with minimal use of hands   Transfers Able to transfer safely, minor use of hands   Standing Unsupported with Eyes Closed Able to stand 10 seconds safely   From Standing, Reach Forward with Outstretched Arm Can reach confidently >25 cm (10")   From Standing Position, Pick up Object from Floor Able to pick up shoe, needs supervision   From Standing Position, Turn to Look Behind Over each Shoulder Looks behind one side only/other side shows less weight shift   Turn 360 Degrees Able to turn 360 degrees safely but slowly   Standing Unsupported, Alternately Place Feet on Step/Stool Able to complete >2 steps/needs minimal assist   Standing Unsupported, One Foot in Front Able to take small step independently and hold 30 seconds   Standing on One Leg Tries to lift leg/unable to hold 3 seconds but remains standing independently          Adult PT Treatment/Exercise - 06/06/14 0700    Exercises Knee/Hip   Long Arc Quad AROM;10 reps   Other Seated Knee Exercises seated hip flexion bilater times 10            PT Short Term Goals - 06/06/14 1438  Title Be independent in HEP to improve strength, balance and safety during functional mobility   Time 4   Period Weeks   Title Assess BERG and write goal   Time 4   Period Weeks   Title Ambulate 300' over even/uneven terrain, with LRAD and supervision to decrease falls risk   Time 4   Period Weeks   Title perform all transfers with LRAD at MOD I level   Time 4   Period Weeks   Title perform TUG</=16 seconds without AD to decrease falls risk   Time 4   Period Weeks          PT Long Term Goals - 06/06/14 1441    Title verbalize understanding of fall prevention strategeis within home environment   Time 8   Period Weeks   Title assess BERG and write goal   Time 8   Period Weeks   Title ambulate 600' over even/unveven terrain with LRAD and supervision to decrease falls risk   Time 8   Period Weeks   Title  perform TUG,/=13 seconds without AD to decrease falls risk   Time 8   Period Weeks   Title Improve ABC score by 10% to decrease fear of falling   Time 8   Period Weeks   Additional Long Term Goals Yes   Title improve DGI score>19 to decrease risk for falls   Time 8   Period Weeks   Title improve gait speed>/=2.66ft/sec to be a limited community ambulator   Time 8   Period Weeks          Plan - 06/06/14 1435    Clinical Impression Statement Patient states she does not want to do exercises at home because "she is lazy and wants [place] to make her better".  Discussed/Encouraged importance of HEP in addition to therapy.   Pt will benefit from skilled therapeutic intervention in order to improve on the following deficits Decreased balance;Decreased strength;Difficulty walking;Decreased activity tolerance   Rehab Potential Good   PT Plan Initiate OTAGO program for HEP        Problem List Patient Active Problem List   Diagnosis Date Noted  . Diverticulosis of colon (without mention of hemorrhage) 08/08/2013  . Acute lower GI bleeding 08/07/2013  . Chronic renal disease, stage 4, severely decreased glomerular filtration rate between 15-29 mL/min/1.73 square meter 08/07/2013  . Acute posthemorrhagic anemia 08/07/2013  . Hypotension due to blood loss 08/07/2013  . CHF (congestive heart failure) 01/05/2012  . Hypertension 12/19/2011  . GERD (gastroesophageal reflux disease) 12/19/2011  . Hypoxia 12/18/2011  . DIABETES, TYPE 2 07/06/2008  . Anemia of chronic kidney failure 07/06/2008  . PULMONARY FIBROSIS ILD POST INFLAMMATORY CHRONIC 07/06/2008  . HYPERCHOLESTEROLEMIA 07/05/2008  . DEPRESSION 07/05/2008                                            Narda Bonds 06/06/2014, 2:47 PM

## 2014-06-07 ENCOUNTER — Ambulatory Visit: Payer: Medicare Other | Admitting: Podiatry

## 2014-06-15 ENCOUNTER — Other Ambulatory Visit: Payer: Self-pay | Admitting: *Deleted

## 2014-06-15 DIAGNOSIS — Z01810 Encounter for preprocedural cardiovascular examination: Secondary | ICD-10-CM

## 2014-06-15 DIAGNOSIS — N184 Chronic kidney disease, stage 4 (severe): Secondary | ICD-10-CM

## 2014-06-20 ENCOUNTER — Encounter: Payer: Self-pay | Admitting: Physical Therapy

## 2014-06-20 ENCOUNTER — Ambulatory Visit: Payer: Medicare Other | Admitting: Physical Therapy

## 2014-06-20 DIAGNOSIS — R262 Difficulty in walking, not elsewhere classified: Secondary | ICD-10-CM

## 2014-06-20 DIAGNOSIS — M6281 Muscle weakness (generalized): Secondary | ICD-10-CM

## 2014-06-20 DIAGNOSIS — R6889 Other general symptoms and signs: Secondary | ICD-10-CM

## 2014-06-20 DIAGNOSIS — Z5189 Encounter for other specified aftercare: Secondary | ICD-10-CM | POA: Diagnosis not present

## 2014-06-20 NOTE — Therapy (Signed)
Physical Therapy Treatment  Patient Details  Name: Susan Mccoy MRN: [id number] Date of Birth: [birth year]  Encounter Date: 06/20/2014      PT End of Session - 06/20/14 1625    Visit Number 3   Number of Visits 17   Date for PT Re-Evaluation 07/26/14   PT Start Time 7035   PT Stop Time 1530   PT Time Calculation (min) 45 min   Activity Tolerance Patient tolerated treatment well   Behavior During Therapy [facility] for tasks assessed/performed      Past Medical History  Diagnosis Date  . Hypertension   . Thyroid disease   . Abnormal LFTs     Fatty liver by [place]  . Hypercholesterolemia   . Carpal tunnel syndrome   . DJD (degenerative joint disease)   . Shingles   . Meralgia paraesthetica   . GERD (gastroesophageal reflux disease)   . Pulmonary fibrosis   . Hemorrhoids   . Diverticulosis   . Normocytic normochromic anemia   . History of recurrent TIAs   . UTI (lower urinary tract infection)   . Complication of anesthesia     trouble with putting her to sleep  . CHF (congestive heart failure)   . Chronic renal disease, stage 4, severely decreased glomerular filtration rate between 15-29 mL/min/1.73 square meter   . Anemia of chronic kidney failure     Past Surgical History  Procedure Laterality Date  . Back surgery      X 2 - lumbar  . Tonsillectomy    . Cataract extraction, bilateral    . Breast biopsy  B/L- said to be negative for malignancy.  . Colonoscopy    . Colonoscopy N/A 08/08/2013    Procedure: COLONOSCOPY;  Surgeon: [name], MD;  Location: [employer];  Service: Endoscopy;  Laterality: N/A;  EGD is possible    There were no vitals taken for this visit.  Visit Diagnosis:  Difficulty walking  Generalized muscle weakness  Activity intolerance      Subjective Assessment - 06/20/14 1537    Symptoms Denies falls.  Became a great grandmother since last visit.  Has not been very active.   Currently in Pain? Yes   Pain Score 1    Pain Location Hip    Pain Orientation Left   Pain Type Chronic pain   Pain Relieving Factors tylenol   Multiple Pain Sites No          [facility] Adult PT Treatment/Exercise - 06/20/14 0001    Knee/Hip Exercises: Aerobic   Stationary Bike foot pedaler x 6 minutes forward and 30 seconds backwards         PT Education - 06/20/14 1625    Education provided Yes   Education Details OTAGO   Person(s) Educated Patient   Methods Explanation;Demonstration;Handout   Comprehension Need further instruction           Plan - 06/20/14 1627    Clinical Impression Statement Encouraged pt to perform OTAGO and to increase activity.  DIscussed where to purchase foot pedaler for home use.   Pt will benefit from skilled therapeutic intervention in order to improve on the following deficits Difficulty walking;Decreased activity tolerance;Decreased strength;Decreased mobility;Decreased balance   Rehab Potential Fair   PT Frequency 2x / week   PT Duration 8 weeks   PT Treatment/Interventions ADLs/Self Care Home Management;Therapeutic activities;Patient/family education;Therapeutic exercise;Balance training;Gait training;Neuromuscular re-education   PT Next Visit Plan review OTAGO and advance as able.  Scifit or Nustep  Consulted and Agree with Plan of Care Patient;Family member/caregiver      Problem List Patient Active Problem List   Diagnosis Date Noted  . Diverticulosis of colon (without mention of hemorrhage) 08/08/2013  . Acute lower GI bleeding 08/07/2013  . Chronic renal disease, stage 4, severely decreased glomerular filtration rate between 15-29 mL/min/1.73 square meter 08/07/2013  . Acute posthemorrhagic anemia 08/07/2013  . Hypotension due to blood loss 08/07/2013  . CHF (congestive heart failure) 01/05/2012  . Hypertension 12/19/2011  . GERD (gastroesophageal reflux disease) 12/19/2011  . Hypoxia 12/18/2011  . DIABETES, TYPE 2 07/06/2008  . Anemia of chronic kidney failure 07/06/2008  . PULMONARY  FIBROSIS ILD POST INFLAMMATORY CHRONIC 07/06/2008  . HYPERCHOLESTEROLEMIA 07/05/2008  . DEPRESSION 07/05/2008        Balance Exercises - 06/20/14 1549    OTAGO PROGRAM   Head Movements Sitting;5 reps   Neck Movements Sitting;5 reps   Back Extension Sitting;5 reps   Trunk Movements Standing;5 reps   Ankle Movements Sitting;10 reps   Knee Extensor 10 reps   Knee Flexor 10 reps   Hip ABductor 10 reps   Ankle Plantorflexors 20 reps, support   Ankle Dorsiflexors 20 reps, support   Knee Bends 10 reps, support   Backwards Walking Support   Sideways Walking Assistive device  using counter       Narda Bonds 06/20/2014, 4:31 PM   Narda Bonds, PTA

## 2014-06-21 ENCOUNTER — Ambulatory Visit: Payer: Medicare Other | Admitting: Physical Therapy

## 2014-06-21 ENCOUNTER — Encounter: Payer: Self-pay | Admitting: Physical Therapy

## 2014-06-21 DIAGNOSIS — M6281 Muscle weakness (generalized): Secondary | ICD-10-CM

## 2014-06-21 DIAGNOSIS — Z5189 Encounter for other specified aftercare: Secondary | ICD-10-CM | POA: Diagnosis not present

## 2014-06-21 DIAGNOSIS — R262 Difficulty in walking, not elsewhere classified: Secondary | ICD-10-CM

## 2014-06-21 DIAGNOSIS — R6889 Other general symptoms and signs: Secondary | ICD-10-CM

## 2014-06-21 NOTE — Therapy (Signed)
Physical Therapy Treatment  Patient Details  Name: Susan Mccoy MRN: [id number] Date of Birth: 17-Jan-1932  Encounter Date: 06/21/2014      PT End of Session - 06/21/14 1244    Visit Number 4   Number of Visits 17   Date for PT Re-Evaluation 07/26/14   PT Start Time 1104   PT Stop Time 1150   PT Time Calculation (min) 46 min   Activity Tolerance Patient tolerated treatment well   Behavior During Therapy [facility] for tasks assessed/performed      Past Medical History  Diagnosis Date  . Hypertension   . Thyroid disease   . Abnormal LFTs     Fatty liver by [place]  . Hypercholesterolemia   . Carpal tunnel syndrome   . DJD (degenerative joint disease)   . Shingles   . Meralgia paraesthetica   . GERD (gastroesophageal reflux disease)   . Pulmonary fibrosis   . Hemorrhoids   . Diverticulosis   . Normocytic normochromic anemia   . History of recurrent TIAs   . UTI (lower urinary tract infection)   . Complication of anesthesia     trouble with putting her to sleep  . CHF (congestive heart failure)   . Chronic renal disease, stage 4, severely decreased glomerular filtration rate between 15-29 mL/min/1.73 square meter   . Anemia of chronic kidney failure     Past Surgical History  Procedure Laterality Date  . Back surgery      X 2 - lumbar  . Tonsillectomy    . Cataract extraction, bilateral    . Breast biopsy  B/L- said to be negative for malignancy.  . Colonoscopy    . Colonoscopy N/A 08/08/2013    Procedure: COLONOSCOPY;  Surgeon: [name], MD;  Location: [place];  Service: Endoscopy;  Laterality: N/A;  EGD is possible    There were no vitals taken for this visit.  Visit Diagnosis:  Activity intolerance  Difficulty walking  Generalized muscle weakness      Subjective Assessment - 06/21/14 1107    Symptoms "I havent told the dr that Charlyn Minerva taking tylenol every day".  Denies falls.   Currently in Pain? No/denies          [facility] Adult PT  Treatment/Exercise - 06/21/14 1243    Knee/Hip Exercises: Aerobic   Stationary Bike foot pedaler x 5 minutes          PT Education - 06/21/14 1244    Education provided Yes   Education Details OTAGO   Person(s) Educated Patient   Methods Explanation;Demonstration;Verbal cues;Handout   Comprehension Verbalized understanding;Need further instruction           Plan - 06/21/14 1245    Clinical Impression Statement Continues to require encouragement to increase activity level.  Is going to buy foot pedaler this afternoon.   Pt will benefit from skilled therapeutic intervention in order to improve on the following deficits Difficulty walking;Decreased activity tolerance;Decreased strength;Decreased mobility;Decreased balance   Rehab Potential Fair   PT Frequency 2x / week   PT Duration 8 weeks   PT Treatment/Interventions ADLs/Self Care Home Management;Therapeutic activities;Patient/family education;Therapeutic exercise;Balance training;Gait training;Neuromuscular re-education   PT Next Visit Plan review OTAGO.  Discuss walking program in home environment.   Consulted and Agree with Plan of Care Patient;Family member/caregiver      Problem List Patient Active Problem List   Diagnosis Date Noted  . Diverticulosis of colon (without mention of hemorrhage) 08/08/2013  . Acute lower GI  bleeding 08/07/2013  . Chronic renal disease, stage 4, severely decreased glomerular filtration rate between 15-29 mL/min/1.73 square meter 08/07/2013  . Acute posthemorrhagic anemia 08/07/2013  . Hypotension due to blood loss 08/07/2013  . CHF (congestive heart failure) 01/05/2012  . Hypertension 12/19/2011  . GERD (gastroesophageal reflux disease) 12/19/2011  . Hypoxia 12/18/2011  . DIABETES, TYPE 2 07/06/2008  . Anemia of chronic kidney failure 07/06/2008  . PULMONARY FIBROSIS ILD POST INFLAMMATORY CHRONIC 07/06/2008  . HYPERCHOLESTEROLEMIA 07/05/2008  . DEPRESSION 07/05/2008          Balance Exercises - 06/21/14 1112    OTAGO PROGRAM   Head Movements Sitting;5 reps   Neck Movements Sitting;5 reps   Back Extension Sitting;5 reps   Trunk Movements Standing;5 reps   Ankle Movements Sitting;10 reps   Knee Extensor 10 reps;Weight (comment)  2#   Knee Flexor 10 reps;Weight (comment)  2#   Hip ABductor 10 reps;Weight (comment)  2#   Ankle Plantorflexors 20 reps, support   Ankle Dorsiflexors 20 reps, support   Knee Bends 10 reps, support   Backwards Walking Support   Sideways Walking Assistive device  counter   Tandem Stance 10 seconds, support   Tandem Walk Support   One Leg Stand 10 seconds, support   Heel Walking Support   Toe Walk Support   Sit to Stand 5 reps, bilateral support      Narda Bonds 06/21/2014, 12:47 PM   Narda Bonds, PTA 12:47 PM

## 2014-06-26 ENCOUNTER — Encounter: Payer: Self-pay | Admitting: Physical Therapy

## 2014-06-26 ENCOUNTER — Ambulatory Visit: Payer: Medicare Other | Admitting: Physical Therapy

## 2014-06-26 DIAGNOSIS — R262 Difficulty in walking, not elsewhere classified: Secondary | ICD-10-CM

## 2014-06-26 DIAGNOSIS — M6281 Muscle weakness (generalized): Secondary | ICD-10-CM

## 2014-06-26 DIAGNOSIS — R6889 Other general symptoms and signs: Secondary | ICD-10-CM

## 2014-06-26 DIAGNOSIS — Z5189 Encounter for other specified aftercare: Secondary | ICD-10-CM | POA: Diagnosis not present

## 2014-06-26 NOTE — Therapy (Signed)
Physical Therapy Treatment  Patient Details  Name: Susan Mccoy MRN: [id number] Date of Birth: 1931/10/15  Encounter Date: 06/26/2014      PT End of Session - 06/26/14 1556    Visit Number 5   Number of Visits 17   Date for PT Re-Evaluation 07/26/14   PT Start Time 1400   PT Stop Time 1447   PT Time Calculation (min) 47 min   Activity Tolerance Patient tolerated treatment well   Behavior During Therapy [facility] for tasks assessed/performed      Past Medical History  Diagnosis Date  . Hypertension   . Thyroid disease   . Abnormal LFTs     Fatty liver by [place]  . Hypercholesterolemia   . Carpal tunnel syndrome   . DJD (degenerative joint disease)   . Shingles   . Meralgia paraesthetica   . GERD (gastroesophageal reflux disease)   . Pulmonary fibrosis   . Hemorrhoids   . Diverticulosis   . Normocytic normochromic anemia   . History of recurrent TIAs   . UTI (lower urinary tract infection)   . Complication of anesthesia     trouble with putting her to sleep  . CHF (congestive heart failure)   . Chronic renal disease, stage 4, severely decreased glomerular filtration rate between 15-29 mL/min/1.73 square meter   . Anemia of chronic kidney failure     Past Surgical History  Procedure Laterality Date  . Back surgery      X 2 - lumbar  . Tonsillectomy    . Cataract extraction, bilateral    . Breast biopsy  B/L- said to be negative for malignancy.  . Colonoscopy    . Colonoscopy N/A 08/08/2013    Procedure: COLONOSCOPY;  Surgeon: [name], MD;  Location: [place];  Service: Endoscopy;  Laterality: N/A;  EGD is possible    There were no vitals taken for this visit.  Visit Diagnosis:  Activity intolerance  Difficulty walking  Generalized muscle weakness      Subjective Assessment - 06/26/14 1404    Symptoms Pt states she got her foot pedaler and has been doing exercises every day but today.  Denies falls.   Currently in Pain? No/denies           [facility] Adult PT Treatment/Exercise - 06/26/14 1553    Transfers   Transfers Sit to Stand;Stand to Sit   Sit to Stand 5: Supervision;With upper extremity assist;Without upper extremity assist;From chair/3-in-1   Stand to Sit 5: Supervision;With upper extremity assist;Without upper extremity assist;To chair/3-in-1   Ambulation/Gait   Ambulation/Gait Yes   Ambulation/Gait Assistance 5: Supervision   Ambulation/Gait Assistance Details x 5 minutes with SaO2 88% after on RA.  Returned to >90 within several minutes.   Ambulation Distance (Feet) 220 Feet  and 100x2   Assistive device Rolling walker;None   Gait Pattern Decreased step length - right;Decreased step length - left;Decreased weight shift to right;Decreased weight shift to left;Narrow base of support  "guarded"          PT Education - 06/26/14 1556    Education provided Yes   Education Details Walking program at home   Person(s) Educated Patient   Methods Explanation;Demonstration   Comprehension Verbalized understanding;Need further instruction          Plan - 06/26/14 1558    Clinical Impression Statement Pt reports performing HEP consistently at home since last visit.     Pt will benefit from skilled therapeutic intervention in order  to improve on the following deficits Difficulty walking;Decreased activity tolerance;Decreased strength;Decreased mobility;Decreased balance   Rehab Potential Fair   PT Frequency 2x / week   PT Duration 8 weeks   PT Treatment/Interventions ADLs/Self Care Home Management;Therapeutic activities;Patient/family education;Therapeutic exercise;Balance training;Gait training;Neuromuscular re-education   PT Next Visit Plan Review walking program and if it was performed at home.  Continue strength and balance.   Consulted and Agree with Plan of Care Patient     Problem List Patient Active Problem List   Diagnosis Date Noted  . Diverticulosis of colon (without mention of hemorrhage) 08/08/2013  .  Acute lower GI bleeding 08/07/2013  . Chronic renal disease, stage 4, severely decreased glomerular filtration rate between 15-29 mL/min/1.73 square meter 08/07/2013  . Acute posthemorrhagic anemia 08/07/2013  . Hypotension due to blood loss 08/07/2013  . CHF (congestive heart failure) 01/05/2012  . Hypertension 12/19/2011  . GERD (gastroesophageal reflux disease) 12/19/2011  . Hypoxia 12/18/2011  . DIABETES, TYPE 2 07/06/2008  . Anemia of chronic kidney failure 07/06/2008  . PULMONARY FIBROSIS ILD POST INFLAMMATORY CHRONIC 07/06/2008  . HYPERCHOLESTEROLEMIA 07/05/2008  . DEPRESSION 07/05/2008         Balance Exercises - 06/26/14 1410    OTAGO PROGRAM   Head Movements Sitting;5 reps   Neck Movements Sitting;5 reps   Back Extension Standing;5 reps   Trunk Movements Standing;5 reps   Ankle Movements Sitting;10 reps   Knee Extensor 10 reps   Knee Flexor 10 reps   Hip ABductor 10 reps   Ankle Plantorflexors 20 reps, support   Ankle Dorsiflexors 20 reps, support   Knee Bends 10 reps, support   Backwards Walking Support   Sideways Walking Assistive device   Tandem Stance 10 seconds, support   Tandem Walk Support   One Leg Stand 10 seconds, support   Heel Walking Support   Toe Walk Support   Sit to Stand 10 reps, bilateral support      Einar Grad [place] 06/26/2014 4:00 PM Phone: [phone] Fax: [phone]

## 2014-06-26 NOTE — Patient Instructions (Signed)
Walk for 4 minutes around your house with or without your walker 4 times a day.  Check your oxygen level after your walk.

## 2014-06-28 ENCOUNTER — Ambulatory Visit: Payer: Medicare Other

## 2014-06-28 DIAGNOSIS — R6889 Other general symptoms and signs: Secondary | ICD-10-CM

## 2014-06-28 DIAGNOSIS — Z5189 Encounter for other specified aftercare: Secondary | ICD-10-CM | POA: Diagnosis not present

## 2014-06-28 DIAGNOSIS — M6281 Muscle weakness (generalized): Secondary | ICD-10-CM

## 2014-06-28 DIAGNOSIS — R262 Difficulty in walking, not elsewhere classified: Secondary | ICD-10-CM

## 2014-06-28 NOTE — Therapy (Signed)
Physical Therapy Treatment  Patient Details  Name: Susan Mccoy MRN: [id number] Date of Birth: [birth year]  Encounter Date: 06/28/2014      PT End of Session - 06/28/14 1559    Visit Number 6   Number of Visits 17   Date for PT Re-Evaluation 07/26/14   PT Start Time 1449   PT Stop Time 1532   PT Time Calculation (min) 43 min   Equipment Utilized During Treatment Gait belt   Activity Tolerance Patient tolerated treatment well   Behavior During Therapy [facility] for tasks assessed/performed      Past Medical History  Diagnosis Date  . Hypertension   . Thyroid disease   . Abnormal LFTs     Fatty liver by [place]  . Hypercholesterolemia   . Carpal tunnel syndrome   . DJD (degenerative joint disease)   . Shingles   . Meralgia paraesthetica   . GERD (gastroesophageal reflux disease)   . Pulmonary fibrosis   . Hemorrhoids   . Diverticulosis   . Normocytic normochromic anemia   . History of recurrent TIAs   . UTI (lower urinary tract infection)   . Complication of anesthesia     trouble with putting her to sleep  . CHF (congestive heart failure)   . Chronic renal disease, stage 4, severely decreased glomerular filtration rate between 15-29 mL/min/1.73 square meter   . Anemia of chronic kidney failure     Past Surgical History  Procedure Laterality Date  . Back surgery      X 2 - lumbar  . Tonsillectomy    . Cataract extraction, bilateral    . Breast biopsy  B/L- said to be negative for malignancy.  . Colonoscopy    . Colonoscopy N/A 08/08/2013    Procedure: COLONOSCOPY;  Surgeon: [name], MD;  Location: [place];  Service: Endoscopy;  Laterality: N/A;  EGD is possible    There were no vitals taken for this visit.  Visit Diagnosis:  Difficulty walking  Activity intolerance  Generalized muscle weakness      Subjective Assessment - 06/28/14 1454    Symptoms Pt reported no falls or changes since last visit. Pt reported she has been performing HEP daily.    Currently in Pain? Yes   Pain Score --  unable to rate, just "uncomfortable".   Pain Location Hip   Pain Orientation Left   Pain Descriptors / Indicators Sore   Pain Type Chronic pain   Aggravating Factors  stiffness after sleeping   Pain Relieving Factors medication   Multiple Pain Sites No            [facility] Adult PT Treatment/Exercise - 06/28/14 1502    Transfers   Transfers Sit to Stand;Stand to Sit   Sit to Stand 7: Independent;Without upper extremity assist;With upper extremity assist;With armrests;From chair/3-in-1  to/from mat and chair with/without armrests and UE assist.   Stand to Sit 7: Independent   Ambulation/Gait   Ambulation/Gait Yes   Ambulation/Gait Assistance 4: Min guard;5: Supervision   Ambulation/Gait Assistance Details Pt ambulated with and without SPC, all with Min Gurad to supervision to ensure safety as pt experienced 1 LOB episode but self corrected with stepping strategy. Pt required seated rest break 2/2 fatigue. SaO2 on RA decr. 87% after ambulation and incr. to >90% with seated rest break and pursed lip breathing. VC's for sequencing with SPC, pt had difficulty with sequencing so PT educated pt on importance of using RW at all times.  Ambulation Distance (Feet) --  75', 115', 60', 100'   Assistive device Straight cane;None   Gait Pattern Decreased step length - right;Decreased step length - left;Decreased weight shift to right;Decreased weight shift to left;Narrow base of support   Standardized Balance Assessment   Standardized Balance Assessment Berg Balance Test;Timed Up and Go Test   Berg Balance Test   Sit to Stand Able to stand without using hands and stabilize independently   Standing Unsupported Able to stand safely 2 minutes   Sitting with Back Unsupported but Feet Supported on Floor or Stool Able to sit safely and securely 2 minutes   Stand to Sit Sits safely with minimal use of hands   Transfers Able to transfer safely, minor use of hands    Standing Unsupported with Eyes Closed Able to stand 10 seconds safely   Standing Ubsupported with Feet Together Able to place feet together independently and stand for 1 minute with supervision   From Standing, Reach Forward with Outstretched Arm Can reach confidently >25 cm (10")  10"   From Standing Position, Pick up Object from Floor Able to pick up shoe, needs supervision   From Standing Position, Turn to Look Behind Over each Shoulder Looks behind one side only/other side shows less weight shift   Turn 360 Degrees Able to turn 360 degrees safely but slowly   Standing Unsupported, Alternately Place Feet on Step/Stool Able to stand independently and complete 8 steps >20 seconds   Standing Unsupported, One Foot in Front Able to plae foot ahead of the other independently and hold 30 seconds   Standing on One Leg Tries to lift leg/unable to hold 3 seconds but remains standing independently   Total Score 46   Timed Up and Go Test   TUG Normal TUG   Normal TUG (seconds) 19.06  no AD          PT Education - 06/28/14 1556    Education provided Yes   Education Details Instructed pt on how to progress walking program at home, by increasing time in 30 seconds increments once 4 minutes becomes easy and SaO2 remaines >90%.   Person(s) Educated Patient;Caregiver(s)   Methods Explanation   Comprehension Verbalized understanding    PT reviewed HEP with pt and she verbalized understanding and agreement.      PT Short Term Goals - 06/28/14 1603    PT SHORT TERM GOAL #1   Title Be independent in HEP to improve strength, balance and safety during functional mobility   Time 0   Period Days   Status Achieved   PT SHORT TERM GOAL #2   Title Assess BERG and write goal   Time 0   Period Days   Status Achieved   PT SHORT TERM GOAL #3   Title Ambulate 300' over even/uneven terrain, with LRAD and supervision to decrease falls risk   Time 0   Period Days   Status Not Met   PT SHORT TERM GOAL  #4   Title perform all transfers with LRAD at MOD I level   Time 0   Period Days   Status Achieved   PT SHORT TERM GOAL #5   Title perform TUG</=16 seconds without AD to decrease falls risk   Time 0   Period Days   Status Not Met          PT Long Term Goals - 06/28/14 1605    PT LONG TERM GOAL #1   Title verbalize understanding of fall  prevention strategeis within home environment. Target date: 07/26/14.   Status On-going   PT LONG TERM GOAL #2   Title assess BERG and write goal.   Time 0   Period Days   Status Achieved   PT LONG TERM GOAL #3   Title ambulate 400' over even/unveven terrain with LRAD and supervision to decrease falls risk. Target date: 07/26/14.   Baseline revised from 600' to 400' based on progress on 06/28/14.   Status Revised   PT LONG TERM GOAL #4   Title perform TUG,/=13 seconds without AD to decrease falls risk. Target date: 07/26/14.   Status On-going   PT LONG TERM GOAL #5   Title Improve ABC score by 10% to decrease fear of falling. Target date: 07/26/14.   Status On-going   PT LONG TERM GOAL #6   Title improve DGI score>19 to decrease risk for falls. Target date: 07/26/14.   Status On-going   PT LONG TERM GOAL #7   Title improve gait speed>/=2.61f/sec to be a limited community ambulator. Target date: 07/26/14.   Status On-going   PT LONG TERM GOAL #8   Title pt will improve BERG balance score >/=50/56 to decrease falls risk. Target date: 07/26/14.   Status On-going          Plan - 06/28/14 1559    Clinical Impression Statement Pt demonstrated progess as she met 2/5 STGs, as goal #2 (BERG goal) had not been set. BERG was not fully completed on second visit as feet together section was not addressed, score was 40/56 without feet together section and pt scored 46/56 today indicating pt is at a moderate risk for falls. Pt was close to meeting TUG goal and ambulation goal but pt continues to require rest breaks 2/2 fatigue and decr. strength. Pt  would continue to benefit from skilled therapy to improve safety during functional mobility.   Pt will benefit from skilled therapeutic intervention in order to improve on the following deficits Difficulty walking;Decreased activity tolerance;Decreased strength;Decreased mobility;Decreased balance   Rehab Potential Good   PT Frequency 2x / week   PT Duration 8 weeks   PT Treatment/Interventions ADLs/Self Care Home Management;Therapeutic activities;Patient/family education;Therapeutic exercise;Balance training;Gait training;Neuromuscular re-education   PT Next Visit Plan continue to trial ambulation with SPC and without an AD. Continue strength and balance training.   Consulted and Agree with Plan of Care Patient;Family member/caregiver        Problem List Patient Active Problem List   Diagnosis Date Noted  . Diverticulosis of colon (without mention of hemorrhage) 08/08/2013  . Acute lower GI bleeding 08/07/2013  . Chronic renal disease, stage 4, severely decreased glomerular filtration rate between 15-29 mL/min/1.73 square meter 08/07/2013  . Acute posthemorrhagic anemia 08/07/2013  . Hypotension due to blood loss 08/07/2013  . CHF (congestive heart failure) 01/05/2012  . Hypertension 12/19/2011  . GERD (gastroesophageal reflux disease) 12/19/2011  . Hypoxia 12/18/2011  . DIABETES, TYPE 2 07/06/2008  . Anemia of chronic kidney failure 07/06/2008  . PULMONARY FIBROSIS ILD POST INFLAMMATORY CHRONIC 07/06/2008  . HYPERCHOLESTEROLEMIA 07/05/2008  . DEPRESSION 07/05/2008                                              Ezriel Boffa L 06/28/2014, 4:13 PM  Ana Liaw L, PT

## 2014-07-03 ENCOUNTER — Ambulatory Visit: Payer: Medicare Other

## 2014-07-03 DIAGNOSIS — M6281 Muscle weakness (generalized): Secondary | ICD-10-CM

## 2014-07-03 DIAGNOSIS — R6889 Other general symptoms and signs: Secondary | ICD-10-CM

## 2014-07-03 DIAGNOSIS — Z5189 Encounter for other specified aftercare: Secondary | ICD-10-CM | POA: Diagnosis not present

## 2014-07-03 DIAGNOSIS — R262 Difficulty in walking, not elsewhere classified: Secondary | ICD-10-CM

## 2014-07-03 NOTE — Therapy (Signed)
Physical Therapy Treatment  Patient Details  Name: Susan Mccoy MRN: [id number] Date of Birth: [birth year]  Encounter Date: 07/03/2014      PT End of Session - 07/03/14 1544    Visit Number 7   Number of Visits 17   Date for PT Re-Evaluation 07/26/14   PT Start Time 1446   PT Stop Time 1529   PT Time Calculation (min) 43 min   Equipment Utilized During Treatment Gait belt   Activity Tolerance Patient tolerated treatment well   Behavior During Therapy [facility] for tasks assessed/performed      Past Medical History  Diagnosis Date  . Hypertension   . Thyroid disease   . Abnormal LFTs     Fatty liver by [place]  . Hypercholesterolemia   . Carpal tunnel syndrome   . DJD (degenerative joint disease)   . Shingles   . Meralgia paraesthetica   . GERD (gastroesophageal reflux disease)   . Pulmonary fibrosis   . Hemorrhoids   . Diverticulosis   . Normocytic normochromic anemia   . History of recurrent TIAs   . UTI (lower urinary tract infection)   . Complication of anesthesia     trouble with putting her to sleep  . CHF (congestive heart failure)   . Chronic renal disease, stage 4, severely decreased glomerular filtration rate between 15-29 mL/min/1.73 square meter   . Anemia of chronic kidney failure     Past Surgical History  Procedure Laterality Date  . Back surgery      X 2 - lumbar  . Tonsillectomy    . Cataract extraction, bilateral    . Breast biopsy  B/L- said to be negative for malignancy.  . Colonoscopy    . Colonoscopy N/A 08/08/2013    Procedure: COLONOSCOPY;  Surgeon: Rachael Fee, MD;  Location: [facility] ENDOSCOPY;  Service: Endoscopy;  Laterality: N/A;  EGD is possible    There were no vitals taken for this visit.  Visit Diagnosis:  Difficulty walking  Activity intolerance  Generalized muscle weakness      Subjective Assessment - 07/03/14 1451    Symptoms Pt denied any changes or falls since last visit. Pt reported she has been performing HEP as  prescribed.   Currently in Pain? Yes   Pain Score 1    Pain Location Hip   Pain Orientation Left   Pain Descriptors / Indicators Sore   Pain Type Chronic pain   Pain Onset More than a month ago   Pain Frequency Intermittent   Aggravating Factors  stiffness after sleeping   Pain Relieving Factors medication            [facility] Adult PT Treatment/Exercise - 07/03/14 1446    Transfers   Transfers Sit to Stand;Stand to Sit   Sit to Stand 5: Supervision;With upper extremity assist;From chair/3-in-1   Sit to Stand Details (indicate cue type and reason) x5. VC's for sequencing with RW.   Stand to Sit 5: Supervision   Stand to Sit Details x5. VC's for sequencing with RW.   Ambulation/Gait   Ambulation/Gait Yes   Ambulation/Gait Assistance 5: Supervision   Ambulation/Gait Assistance Details Pt ambulated with supervision and VC's to improve stride length, L heel strike and upright posture. Pt's SaO2 on RA decr. to 87-89%, and incr. to >90% with seated rest break and pursed lip breathing. Pt utilized 2L of O2 via nasal cannula ([place]) during last 230' of ambulation and SaO2 remained 91-94%. Pt required seated rest breaks 2/2  fatigue and lightheadedness.   Ambulation Distance (Feet) --  52' without AD, 230'x2 with RW, 8' with rollator   Assistive device Rolling walker   Gait Pattern Decreased weight shift to right;Narrow base of support;Decreased dorsiflexion - left;Decreased step length - left;Decreased stance time - right          PT Education - 07/03/14 1543    Education provided Yes   Education Details PT educated pt on using RW at all times for safety.   Person(s) Educated Patient;Caregiver(s)   Methods Explanation   Comprehension Verbalized understanding;Need further instruction  Pt reported "I'll try to remember."          PT Short Term Goals - 07/03/14 1547    PT SHORT TERM GOAL #1   Title Be independent in HEP to improve strength, balance and safety during functional  mobility   Time 0   Period Days   Status Achieved   PT SHORT TERM GOAL #2   Title Assess BERG and write goal   Time 0   Period Days   Status Achieved   PT SHORT TERM GOAL #3   Title Ambulate 300' over even/uneven terrain, with LRAD and supervision to decrease falls risk   Time 0   Period Days   Status Not Met   PT SHORT TERM GOAL #4   Title perform all transfers with LRAD at MOD I level   Time 0   Period Days   Status Achieved   PT SHORT TERM GOAL #5   Title perform TUG</=16 seconds without AD to decrease falls risk   Time 0   Period Days   Status Not Met          PT Long Term Goals - 07/03/14 1547    PT LONG TERM GOAL #1   Title verbalize understanding of fall prevention strategeis within home environment. Target date: 07/26/14.   Status On-going   PT LONG TERM GOAL #2   Title assess BERG and write goal.   Time 0   Period Days   Status Achieved   PT LONG TERM GOAL #3   Title ambulate 400' over even/unveven terrain with LRAD and supervision to decrease falls risk. Target date: 07/26/14.   Baseline revised from 600' to 400' based on progress on 06/28/14.   Status Revised   PT LONG TERM GOAL #4   Title perform TUG,/=13 seconds without AD to decrease falls risk. Target date: 07/26/14.   Status On-going   PT LONG TERM GOAL #5   Title Improve ABC score by 10% to decrease fear of falling. Target date: 07/26/14.   Status On-going   PT LONG TERM GOAL #6   Title improve DGI score>19 to decrease risk for falls. Target date: 07/26/14.   Status On-going   PT LONG TERM GOAL #7   Title improve gait speed>/=2.42ft/sec to be a limited community ambulator. Target date: 07/26/14.   Status On-going   PT LONG TERM GOAL #8   Title pt will improve BERG balance score >/=50/56 to decrease falls risk. Target date: 07/26/14.   Status On-going          Plan - 07/03/14 1544    Clinical Impression Statement Pt demonstrated progress as she was able to ambulate longer distance with  less assist today. Pt continues to be limited by fatigue and requires re-education on the importance of using 2L of O2 as prescribed by MD and to use RW at all times. Pt would continue to benefit from  skilled therapy to improve safety during functional mobility.   Pt will benefit from skilled therapeutic intervention in order to improve on the following deficits Difficulty walking;Decreased activity tolerance;Decreased strength;Decreased mobility;Decreased balance   Rehab Potential Good   PT Frequency 2x / week   PT Duration 8 weeks   PT Treatment/Interventions ADLs/Self Care Home Management;Therapeutic activities;Patient/family education;Therapeutic exercise;Balance training;Gait training;Neuromuscular re-education   PT Plan Continue to progress towards LTGs, progress to dynamic gait and balance training.        Problem List Patient Active Problem List   Diagnosis Date Noted  . Diverticulosis of colon (without mention of hemorrhage) 08/08/2013  . Acute lower GI bleeding 08/07/2013  . Chronic renal disease, stage 4, severely decreased glomerular filtration rate between 15-29 mL/min/1.73 square meter 08/07/2013  . Acute posthemorrhagic anemia 08/07/2013  . Hypotension due to blood loss 08/07/2013  . CHF (congestive heart failure) 01/05/2012  . Hypertension 12/19/2011  . GERD (gastroesophageal reflux disease) 12/19/2011  . Hypoxia 12/18/2011  . DIABETES, TYPE 2 07/06/2008  . Anemia of chronic kidney failure 07/06/2008  . PULMONARY FIBROSIS ILD POST INFLAMMATORY CHRONIC 07/06/2008  . HYPERCHOLESTEROLEMIA 07/05/2008  . DEPRESSION 07/05/2008                                              Allisson Schindel L 07/03/2014, 3:48 PM     Geoffry Paradise, PT,DPT 07/03/2014 3:48 PM Phone: [phone] Fax: [phone]

## 2014-07-05 ENCOUNTER — Ambulatory Visit: Payer: Medicare Other | Attending: Podiatry

## 2014-07-05 DIAGNOSIS — M6281 Muscle weakness (generalized): Secondary | ICD-10-CM | POA: Diagnosis not present

## 2014-07-05 DIAGNOSIS — R270 Ataxia, unspecified: Secondary | ICD-10-CM | POA: Diagnosis not present

## 2014-07-05 DIAGNOSIS — R262 Difficulty in walking, not elsewhere classified: Secondary | ICD-10-CM

## 2014-07-05 DIAGNOSIS — R5381 Other malaise: Secondary | ICD-10-CM | POA: Insufficient documentation

## 2014-07-05 DIAGNOSIS — Z5189 Encounter for other specified aftercare: Secondary | ICD-10-CM | POA: Diagnosis present

## 2014-07-05 DIAGNOSIS — E1140 Type 2 diabetes mellitus with diabetic neuropathy, unspecified: Secondary | ICD-10-CM | POA: Diagnosis not present

## 2014-07-05 DIAGNOSIS — R6889 Other general symptoms and signs: Secondary | ICD-10-CM

## 2014-07-05 NOTE — Therapy (Signed)
[facility] [address] [place], [place], [address] Phone: [phone]   Fax:  [phone]  Physical Therapy Treatment  Patient Details  Name: Susan Mccoy MRN: [id number] Date of Birth: [birth year]  Encounter Date: 07/05/2014      PT End of Session - 07/05/14 1555    Visit Number 8   Number of Visits 17   Date for PT Re-Evaluation 07/26/14   PT Start Time 6568   PT Stop Time 1530   PT Time Calculation (min) 47 min   Equipment Utilized During Treatment Gait belt   Activity Tolerance Patient tolerated treatment well   Behavior During Therapy [facility] for tasks assessed/performed      Past Medical History  Diagnosis Date  . Hypertension   . Thyroid disease   . Abnormal LFTs     Fatty liver by [place]  . Hypercholesterolemia   . Carpal tunnel syndrome   . DJD (degenerative joint disease)   . Shingles   . Meralgia paraesthetica   . GERD (gastroesophageal reflux disease)   . Pulmonary fibrosis   . Hemorrhoids   . Diverticulosis   . Normocytic normochromic anemia   . History of recurrent TIAs   . UTI (lower urinary tract infection)   . Complication of anesthesia     trouble with putting her to sleep  . CHF (congestive heart failure)   . Chronic renal disease, stage 4, severely decreased glomerular filtration rate between 15-29 mL/min/1.73 square meter   . Anemia of chronic kidney failure     Past Surgical History  Procedure Laterality Date  . Back surgery      X 2 - lumbar  . Tonsillectomy    . Cataract extraction, bilateral    . Breast biopsy  B/L- said to be negative for malignancy.  . Colonoscopy    . Colonoscopy N/A 08/08/2013    Procedure: COLONOSCOPY;  Surgeon: [name], MD;  Location: [place];  Service: Endoscopy;  Laterality: N/A;  EGD is possible    There were no vitals taken for this visit.  Visit Diagnosis:  Difficulty walking  Activity intolerance  Generalized muscle weakness       Subjective Assessment - 07/05/14 1446    Symptoms Pt denied falls or any changes since last visit. Pt reported chronic back and leg pain.   Currently in Pain? Yes   Pain Score 2    Pain Location --  back and B LE pain   Pain Descriptors / Indicators Aching;Dull   Pain Type Chronic pain   Pain Onset More than a month ago   Aggravating Factors  stiffness after sleeping   Pain Relieving Factors medication            [facility] Adult PT Treatment/Exercise - 07/05/14 1443    Transfers   Transfers Sit to Stand;Stand to Sit   Sit to Stand 5: Supervision;With upper extremity assist;From chair/3-in-1   Sit to Stand Details (indicate cue type and reason) x5. VC's for hand placement.   Stand to Sit 5: Supervision   Stand to Sit Details x5. VC's for hand placement.   Ambulation/Gait   Ambulation/Gait Yes   Ambulation/Gait Assistance 5: Supervision;4: Min guard   Ambulation/Gait Assistance Details Pt ambulated over even terrain while performing vertical/horizontal head turns. Pt ambulated in parallel bars (4x7') with B UE support over ladder on floor to improve stride length/height. VC's to improve stride length, L heel strike, and to continue on a straight path while performing  head turns as pt had a tendency to veer to L side. Pt required seated rest breaks after each bout of ambulation due to fatigue and decr. Sao2. Pt's SaO2 on RA at beginning of session 88%, which improved to >90% with 2L of O2 via [place].   Ambulation Distance (Feet) --  95' no AD. 200', 230', 230' all with RW.   Assistive device Rolling walker   Gait Pattern Decreased weight shift to right;Narrow base of support;Decreased dorsiflexion - left;Decreased step length - left;Decreased stance time - right            PT Short Term Goals - 07/05/14 1557    PT SHORT TERM GOAL #1   Title Be independent in HEP to improve strength, balance and safety during functional mobility   Time 0   Period Days   Status Achieved   PT SHORT  TERM GOAL #2   Title Assess BERG and write goal   Time 0   Period Days   Status Achieved   PT SHORT TERM GOAL #3   Title Ambulate 300' over even/uneven terrain, with LRAD and supervision to decrease falls risk   Time 0   Period Days   Status Not Met   PT SHORT TERM GOAL #4   Title perform all transfers with LRAD at MOD I level   Time 0   Period Days   Status Achieved   PT SHORT TERM GOAL #5   Title perform TUG</=16 seconds without AD to decrease falls risk   Time 0   Period Days   Status Not Met          PT Long Term Goals - 07/05/14 1557    PT LONG TERM GOAL #1   Title verbalize understanding of fall prevention strategeis within home environment. Target date: 07/26/14.   Status On-going   PT LONG TERM GOAL #2   Title assess BERG and write goal.   Time 0   Period Days   Status Achieved   PT LONG TERM GOAL #3   Title ambulate 400' over even/unveven terrain with LRAD and supervision to decrease falls risk. Target date: 07/26/14.   Baseline revised from 600' to 400' based on progress on 06/28/14.   Status Revised   PT LONG TERM GOAL #4   Title perform TUG,/=13 seconds without AD to decrease falls risk. Target date: 07/26/14.   Status On-going   PT LONG TERM GOAL #5   Title Improve ABC score by 10% to decrease fear of falling. Target date: 07/26/14.   Status On-going   PT LONG TERM GOAL #6   Title improve DGI score>19 to decrease risk for falls. Target date: 07/26/14.   Status On-going   PT LONG TERM GOAL #7   Title improve gait speed>/=2.25ft/sec to be a limited community ambulator. Target date: 07/26/14.   Status On-going   PT LONG TERM GOAL #8   Title pt will improve BERG balance score >/=50/56 to decrease falls risk. Target date: 07/26/14.   Status On-going          Plan - 07/05/14 1555    Clinical Impression Statement Pt continues to be limited by fatigue, as she required frequent rest breaks during session. Pt experienced difficulty ambulating in a straight  path while performing head turns, and would continue to benefit from skilled PT to improve safety during functional mobilty.   Pt will benefit from skilled therapeutic intervention in order to improve on the following deficits Difficulty walking;Decreased activity tolerance;Decreased  strength;Decreased mobility;Decreased balance   Rehab Potential Good   PT Frequency 2x / week   PT Duration 8 weeks   PT Treatment/Interventions ADLs/Self Care Home Management;Therapeutic activities;Patient/family education;Therapeutic exercise;Balance training;Gait training;Neuromuscular re-education   PT Next Visit Plan Continue dynamic gait training with SPC/RW and without AD. Dynamic balance training. Power/strength training.   Consulted and Agree with Plan of Care Patient;Family member/caregiver   Family Member Consulted family friend                               Problem List Patient Active Problem List   Diagnosis Date Noted  . Diverticulosis of colon (without mention of hemorrhage) 08/08/2013  . Acute lower GI bleeding 08/07/2013  . Chronic renal disease, stage 4, severely decreased glomerular filtration rate between 15-29 mL/min/1.73 square meter 08/07/2013  . Acute posthemorrhagic anemia 08/07/2013  . Hypotension due to blood loss 08/07/2013  . CHF (congestive heart failure) 01/05/2012  . Hypertension 12/19/2011  . GERD (gastroesophageal reflux disease) 12/19/2011  . Hypoxia 12/18/2011  . DIABETES, TYPE 2 07/06/2008  . Anemia of chronic kidney failure 07/06/2008  . PULMONARY FIBROSIS ILD POST INFLAMMATORY CHRONIC 07/06/2008  . HYPERCHOLESTEROLEMIA 07/05/2008  . DEPRESSION 07/05/2008    Misa Fedorko L 07/05/2014, 3:59 PM     Geoffry Paradise, PT,DPT 07/05/2014 3:59 PM Phone: [phone] Fax: [phone]

## 2014-07-10 ENCOUNTER — Ambulatory Visit: Payer: Medicare Other

## 2014-07-10 DIAGNOSIS — R262 Difficulty in walking, not elsewhere classified: Secondary | ICD-10-CM

## 2014-07-10 DIAGNOSIS — Z5189 Encounter for other specified aftercare: Secondary | ICD-10-CM | POA: Diagnosis not present

## 2014-07-10 DIAGNOSIS — R6889 Other general symptoms and signs: Secondary | ICD-10-CM

## 2014-07-10 DIAGNOSIS — M6281 Muscle weakness (generalized): Secondary | ICD-10-CM

## 2014-07-10 NOTE — Therapy (Signed)
[facility] [address] [place], [place], [address] Phone: [phone]   Fax:  [phone]  Physical Therapy Treatment  Patient Details  Name: Susan Mccoy MRN: [id number] Date of Birth: May 21, 1932  Encounter Date: 07/10/2014      PT End of Session - 07/10/14 1515    Visit Number 9   Number of Visits 17   Date for PT Re-Evaluation 07/26/14   PT Start Time 1315   PT Stop Time 1358   PT Time Calculation (min) 43 min   Equipment Utilized During Treatment Gait belt   Activity Tolerance Patient tolerated treatment well   Behavior During Therapy [facility] for tasks assessed/performed      Past Medical History  Diagnosis Date  . Hypertension   . Thyroid disease   . Abnormal LFTs     Fatty liver by [place]  . Hypercholesterolemia   . Carpal tunnel syndrome   . DJD (degenerative joint disease)   . Shingles   . Meralgia paraesthetica   . GERD (gastroesophageal reflux disease)   . Pulmonary fibrosis   . Hemorrhoids   . Diverticulosis   . Normocytic normochromic anemia   . History of recurrent TIAs   . UTI (lower urinary tract infection)   . Complication of anesthesia     trouble with putting her to sleep  . CHF (congestive heart failure)   . Chronic renal disease, stage 4, severely decreased glomerular filtration rate between 15-29 mL/min/1.73 square meter   . Anemia of chronic kidney failure     Past Surgical History  Procedure Laterality Date  . Back surgery      X 2 - lumbar  . Tonsillectomy    . Cataract extraction, bilateral    . Breast biopsy  B/L- said to be negative for malignancy.  . Colonoscopy    . Colonoscopy N/A 08/08/2013    Procedure: COLONOSCOPY;  Surgeon: [name], MD;  Location: [place];  Service: Endoscopy;  Laterality: N/A;  EGD is possible    There were no vitals taken for this visit.  Visit Diagnosis:  Difficulty walking  Activity intolerance  Generalized muscle weakness       Subjective Assessment - 07/10/14 1320    Symptoms Pt reported she's been very tired lately and she's not sure why. Pt denied changes or falls since last visit. Pt reported she has walked but not performed HEP since last visit.    Currently in Pain? No/denies            [facility] Adult PT Treatment/Exercise - 07/10/14 1506    Ambulation/Gait   Ambulation/Gait Yes   Ambulation/Gait Assistance 4: Min guard   Ambulation/Gait Assistance Details Pt ambulated over even terrain, VC's to improve stride length, and L heel strike. Pt demonstrated improved stride length after neuro re-ed (balance and weight shift training).   Ambulation Distance (Feet) --  75', 117', 230'   Assistive device None   Gait Pattern Decreased weight shift to right;Narrow base of support;Decreased dorsiflexion - left;Decreased step length - left;Decreased stance time - right   Balance   Balance Assessed Yes   Dynamic Standing Balance   Dynamic Standing - Balance Support Right upper extremity supported;No upper extremity supported   Dynamic Standing - Level of Assistance 4: Min assist;Other (comment)  min guard   Dynamic Standing - Balance Activities Alternating  foot traps;Step ups   Dynamic Standing - Comments B LE heel taps with min guard and no UE support: dots 2x10/LE,  2" step 2x10. In parallel bars with one UE support: 2" step-ups 2x10. VC's to improve weight shifting. Pt required seated rest breaks due to fatigue, and one, 5 minute, seated rest break due to lightheadedness. See therapy note for details.    Vitals: pt's SaO2 on room air was 95%, SaO2 decreased to 89% after ambulation and increased to >53% with application of 2L of O2 via [place] and pursed lip breathing.  Pt reported she felt lightheaded during balance activities so BP assessed in sitting (89/64) and standing:( 98/64), with no changes in symptoms.       PT Education - 07/10/14 1514    Education provided Yes   Education Details Reiterated the importance  of using 2L of O2 as prescribed by MD, informing MD of  decreased low BP and lightheadedness, and using RW at all times. Re-educated pt on pursed lip breathing, as pt breathes with mouth open.   Person(s) Educated Patient;Caregiver(s)   Methods Explanation   Comprehension Verbalized understanding          PT Short Term Goals - 07/10/14 1518    PT SHORT TERM GOAL #1   Title Be independent in HEP to improve strength, balance and safety during functional mobility   Time 0   Period Days   Status Achieved   PT SHORT TERM GOAL #2   Title Assess BERG and write goal   Time 0   Period Days   Status Achieved   PT SHORT TERM GOAL #3   Title Ambulate 300' over even/uneven terrain, with LRAD and supervision to decrease falls risk   Time 0   Period Days   Status Not Met   PT SHORT TERM GOAL #4   Title perform all transfers with LRAD at MOD I level   Time 0   Period Days   Status Achieved   PT SHORT TERM GOAL #5   Title perform TUG</=16 seconds without AD to decrease falls risk   Time 0   Period Days   Status Not Met          PT Long Term Goals - 07/10/14 1518    PT LONG TERM GOAL #1   Title verbalize understanding of fall prevention strategeis within home environment. Target date: 07/26/14.   Status On-going   PT LONG TERM GOAL #2   Title assess BERG and write goal.   Time 0   Period Days   Status Achieved   PT LONG TERM GOAL #3   Title ambulate 400' over even/unveven terrain with LRAD and supervision to decrease falls risk. Target date: 07/26/14.   Baseline revised from 600' to 400' based on progress on 06/28/14.   Status Revised   PT LONG TERM GOAL #4   Title perform TUG,/=13 seconds without AD to decrease falls risk. Target date: 07/26/14.   Status On-going   PT LONG TERM GOAL #5   Title Improve ABC score by 10% to decrease fear of falling. Target date: 07/26/14.   Status On-going   PT LONG TERM GOAL #6   Title improve DGI score>19 to decrease risk for falls. Target  date: 07/26/14.   Status On-going   PT LONG TERM GOAL #7   Title improve gait speed>/=2.66ft/sec to be a limited community ambulator. Target date: 07/26/14.   Status On-going   PT LONG TERM GOAL #8   Title pt will improve BERG balance score >/=50/56 to decrease falls risk. Target date: 07/26/14.   Status On-going  Plan - 07/10/14 1515    Clinical Impression Statement Pt limited today by lightheadedness, as she required frequent rest breaks and experienced decreased BP in seated position. Pt would continue to benefit from skilled PT to improve safety during funcitonal mobility.   Pt will benefit from skilled therapeutic intervention in order to improve on the following deficits Difficulty walking;Decreased activity tolerance;Decreased strength;Decreased mobility;Decreased balance   Rehab Potential Good   PT Frequency 2x / week   PT Duration 8 weeks   PT Treatment/Interventions ADLs/Self Care Home Management;Therapeutic activities;Patient/family education;Therapeutic exercise;Balance training;Gait training;Neuromuscular re-education   PT Next Visit Plan Power training(timed heel raises), dynamic gait with RW. Dynamic balance training (weight shifting).   Consulted and Agree with Plan of Care Patient;Family member/caregiver   Family Member Consulted family friend                               Problem List Patient Active Problem List   Diagnosis Date Noted  . Diverticulosis of colon (without mention of hemorrhage) 08/08/2013  . Acute lower GI bleeding 08/07/2013  . Chronic renal disease, stage 4, severely decreased glomerular filtration rate between 15-29 mL/min/1.73 square meter 08/07/2013  . Acute posthemorrhagic anemia 08/07/2013  . Hypotension due to blood loss 08/07/2013  . CHF (congestive heart failure) 01/05/2012  . Hypertension 12/19/2011  . GERD (gastroesophageal reflux disease) 12/19/2011  . Hypoxia 12/18/2011  . DIABETES, TYPE 2 07/06/2008   . Anemia of chronic kidney failure 07/06/2008  . PULMONARY FIBROSIS ILD POST INFLAMMATORY CHRONIC 07/06/2008  . HYPERCHOLESTEROLEMIA 07/05/2008  . DEPRESSION 07/05/2008    Zakari Bathe L 07/10/2014, 3:19 PM     Geoffry Paradise, PT,DPT 07/10/2014 3:19 PM Phone: [phone] Fax: [phone]

## 2014-07-12 ENCOUNTER — Other Ambulatory Visit (HOSPITAL_COMMUNITY): Payer: Medicare Other

## 2014-07-12 ENCOUNTER — Encounter (HOSPITAL_COMMUNITY): Payer: Medicare Other

## 2014-07-12 ENCOUNTER — Ambulatory Visit: Payer: Medicare Other | Admitting: Vascular Surgery

## 2014-07-12 ENCOUNTER — Ambulatory Visit: Payer: Medicare Other

## 2014-07-12 DIAGNOSIS — Z5189 Encounter for other specified aftercare: Secondary | ICD-10-CM | POA: Diagnosis not present

## 2014-07-12 DIAGNOSIS — M6281 Muscle weakness (generalized): Secondary | ICD-10-CM

## 2014-07-12 DIAGNOSIS — R262 Difficulty in walking, not elsewhere classified: Secondary | ICD-10-CM

## 2014-07-12 DIAGNOSIS — R6889 Other general symptoms and signs: Secondary | ICD-10-CM

## 2014-07-12 NOTE — Therapy (Signed)
[facility] [address] [place], [place], [address] Phone: [phone]   Fax:  [phone]  Physical Therapy Treatment  Patient Details  Name: Susan Mccoy MRN: [id number] Date of Birth: [birth year]  Encounter Date: 07/12/2014      PT End of Session - 07/12/14 1531    Visit Number 10   Number of Visits 17   Date for PT Re-Evaluation 07/26/14   PT Start Time 1446   PT Stop Time 1524   PT Time Calculation (min) 38 min   Equipment Utilized During Treatment Gait belt   Activity Tolerance Patient tolerated treatment well   Behavior During Therapy [facility] for tasks assessed/performed      Past Medical History  Diagnosis Date  . Hypertension   . Thyroid disease   . Abnormal LFTs     Fatty liver by [place]  . Hypercholesterolemia   . Carpal tunnel syndrome   . DJD (degenerative joint disease)   . Shingles   . Meralgia paraesthetica   . GERD (gastroesophageal reflux disease)   . Pulmonary fibrosis   . Hemorrhoids   . Diverticulosis   . Normocytic normochromic anemia   . History of recurrent TIAs   . UTI (lower urinary tract infection)   . Complication of anesthesia     trouble with putting her to sleep  . CHF (congestive heart failure)   . Chronic renal disease, stage 4, severely decreased glomerular filtration rate between 15-29 mL/min/1.73 square meter   . Anemia of chronic kidney failure     Past Surgical History  Procedure Laterality Date  . Back surgery      X 2 - lumbar  . Tonsillectomy    . Cataract extraction, bilateral    . Breast biopsy  B/L- said to be negative for malignancy.  . Colonoscopy    . Colonoscopy N/A 08/08/2013    Procedure: COLONOSCOPY;  Surgeon: [name], MD;  Location: [facility];  Service: Endoscopy;  Laterality: N/A;  EGD is possible    SpO2   Visit Diagnosis:  Difficulty walking  Activity intolerance  Generalized muscle weakness      Subjective Assessment - 07/12/14 1450    Symptoms Pt reported she still feels tired, she sleeps for about 11 hours each night. Pt reported she has not been performing HEP very often, due to "not having time".  Pt denied falls since last visit.   Currently in Pain? Yes   Pain Location --  back and B LE pain   Pain Descriptors / Indicators Sore   Pain Type Chronic pain   Pain Onset More than a month ago   Pain Frequency Intermittent              PT Education - 07/12/14 1530    Education provided Yes   Education Details Reviewed OTAGO HEP, as pt had not completed in approx. 2 weeks due to time constraints and difficulty with exercises. PT reiterated the importance of performing HEP and walking to improve energy, strength, and endurance.   Person(s) Educated Patient;Caregiver(s)   Methods Explanation   Comprehension Verbalized understanding;Returned demonstration          PT Short Term Goals - 07/12/14 1534    PT SHORT TERM GOAL #1   Title Be independent in HEP to improve strength, balance and safety during functional mobility   Time 0   Period Days   Status Achieved   PT SHORT TERM GOAL #2   Title Assess BERG  and write goal   Time 0   Period Days   Status Achieved   PT SHORT TERM GOAL #3   Title Ambulate 300' over even/uneven terrain, with LRAD and supervision to decrease falls risk   Time 0   Period Days   Status Not Met   PT SHORT TERM GOAL #4   Title perform all transfers with LRAD at MOD I level   Time 0   Period Days   Status Achieved   PT SHORT TERM GOAL #5   Title perform TUG</=16 seconds without AD to decrease falls risk   Time 0   Period Days   Status Not Met          PT Long Term Goals - 07-20-14 1534    PT LONG TERM GOAL #1   Title verbalize understanding of fall prevention strategeis within home environment. Target date: 07/26/14.   Status On-going   PT LONG TERM GOAL #2   Title assess BERG and write goal.   Time 0   Period Days   Status Achieved   PT LONG TERM GOAL #3   Title  ambulate 400' over even/unveven terrain with LRAD and supervision to decrease falls risk. Target date: 07/26/14.   Baseline revised from 600' to 400' based on progress on 06/28/14.   Status Revised   PT LONG TERM GOAL #4   Title perform TUG,/=13 seconds without AD to decrease falls risk. Target date: 07/26/14.   Status On-going   PT LONG TERM GOAL #5   Title Improve ABC score by 10% to decrease fear of falling. Target date: 07/26/14.   Status On-going   PT LONG TERM GOAL #6   Title improve DGI score>19 to decrease risk for falls. Target date: 07/26/14.   Status On-going   PT LONG TERM GOAL #7   Title improve gait speed>/=2.7f/sec to be a limited community ambulator. Target date: 07/26/14.   Status On-going   PT LONG TERM GOAL #8   Title pt will improve BERG balance score >/=50/56 to decrease falls risk. Target date: 07/26/14.   Status On-going          Plan - 1December 17, 20151532    Clinical Impression Statement Pt demonstrated progress, as she was able to perform OTAGO without rest breaks today and SaO2 on RA remained >90% with no c/o SOB. Pt continues to experience decr. stride length and heel strike during ambulation and woudl continue to benefit from PT to improve safety during functional mobility.   Pt will benefit from skilled therapeutic intervention in order to improve on the following deficits Difficulty walking;Decreased activity tolerance;Decreased strength;Decreased mobility;Decreased balance   Rehab Potential Good   PT Frequency 2x / week   PT Duration 8 weeks   PT Treatment/Interventions ADLs/Self Care Home Management;Therapeutic activities;Patient/family education;Therapeutic exercise;Balance training;Gait training;Neuromuscular re-education   PT Next Visit Plan Dynamic gait and balance (weight shifting) training.   Consulted and Agree with Plan of Care Patient;Family member/caregiver   Family Member Consulted family friend          G-Codes - [id number]/17/[id number]     Functional Assessment Tool Used BERG: 46/56; TUG: 19.08 no AD   Functional Limitation Mobility: Walking and moving around   Mobility: Walking and Moving Around Current Status [phone] At least 20 percent but less than 40 percent impaired, limited or restricted   Mobility: Walking and Moving Around Goal Status (([id number] At least 1 percent but less than 20 percent impaired, limited or restricted  Balance Exercises - 07/12/14 1458    OTAGO PROGRAM   Head Movements Sitting;5 reps   Neck Movements Sitting;5 reps   Back Extension Sitting;5 reps   Trunk Movements Standing;5 reps   Ankle Movements Sitting;10 reps   Knee Extensor 10 reps  2x5 reps   Knee Flexor 10 reps  2x5 reps   Hip ABductor 10 reps  2x5 reps. VC's to keep toes forward vs. ER.   Ankle Plantorflexors 20 reps, support   Ankle Dorsiflexors 20 reps, support   Knee Bends 10 reps, support   Backwards Walking Support   Sideways Walking Assistive device   Tandem Stance 10 seconds, support   Tandem Walk Support   One Leg Stand 10 seconds, support   Heel Walking Support   Toe Walk Support   Sit to Stand 10 reps, bilateral support   Overall OTAGO Comments VC's for technique and to improve eccentric control.              Problem List Patient Active Problem List   Diagnosis Date Noted  . Diverticulosis of colon (without mention of hemorrhage) 08/08/2013  . Acute lower GI bleeding 08/07/2013  . Chronic renal disease, stage 4, severely decreased glomerular filtration rate between 15-29 mL/min/1.73 square meter 08/07/2013  . Acute posthemorrhagic anemia 08/07/2013  . Hypotension due to blood loss 08/07/2013  . CHF (congestive heart failure) 01/05/2012  . Hypertension 12/19/2011  . GERD (gastroesophageal reflux disease) 12/19/2011  . Hypoxia 12/18/2011  . DIABETES, TYPE 2 07/06/2008  . Anemia of chronic kidney failure 07/06/2008  . PULMONARY FIBROSIS ILD POST INFLAMMATORY CHRONIC  07/06/2008  . HYPERCHOLESTEROLEMIA 07/05/2008  . DEPRESSION 07/05/2008    Alithea Lapage L 07/12/2014, 3:39 PM     Geoffry Paradise, PT,DPT 07/12/2014 3:39 PM Phone: [phone] Fax: [phone]

## 2014-07-14 ENCOUNTER — Ambulatory Visit: Payer: Medicare Other | Admitting: Vascular Surgery

## 2014-07-14 ENCOUNTER — Encounter (HOSPITAL_COMMUNITY): Payer: Medicare Other

## 2014-07-14 ENCOUNTER — Other Ambulatory Visit (HOSPITAL_COMMUNITY): Payer: Medicare Other

## 2014-07-17 ENCOUNTER — Ambulatory Visit: Payer: Medicare Other

## 2014-07-17 VITALS — HR 66

## 2014-07-17 DIAGNOSIS — R262 Difficulty in walking, not elsewhere classified: Secondary | ICD-10-CM

## 2014-07-17 DIAGNOSIS — R6889 Other general symptoms and signs: Secondary | ICD-10-CM

## 2014-07-17 DIAGNOSIS — M6281 Muscle weakness (generalized): Secondary | ICD-10-CM

## 2014-07-17 DIAGNOSIS — Z5189 Encounter for other specified aftercare: Secondary | ICD-10-CM | POA: Diagnosis not present

## 2014-07-17 NOTE — Therapy (Addendum)
[facility] [address] [place], [place], [address] Phone: [phone]   Fax:  [phone]  Physical Therapy Treatment  Patient Details  Name: Susan Mccoy MRN: [id number] Date of Birth: 11-02-1931  Encounter Date: 07/17/2014      PT End of Session - 07/17/14 1543    Visit Number 11   Number of Visits 17   Date for PT Re-Evaluation 07/26/14   PT Start Time 1446   PT Stop Time 1527   PT Time Calculation (min) 41 min   Equipment Utilized During Treatment Gait belt   Activity Tolerance Patient tolerated treatment well   Behavior During Therapy [facility] for tasks assessed/performed      Past Medical History  Diagnosis Date  . Hypertension   . Thyroid disease   . Abnormal LFTs     Fatty liver by [place]  . Hypercholesterolemia   . Carpal tunnel syndrome   . DJD (degenerative joint disease)   . Shingles   . Meralgia paraesthetica   . GERD (gastroesophageal reflux disease)   . Pulmonary fibrosis   . Hemorrhoids   . Diverticulosis   . Normocytic normochromic anemia   . History of recurrent TIAs   . UTI (lower urinary tract infection)   . Complication of anesthesia     trouble with putting her to sleep  . CHF (congestive heart failure)   . Chronic renal disease, stage 4, severely decreased glomerular filtration rate between 15-29 mL/min/1.73 square meter   . Anemia of chronic kidney failure     Past Surgical History  Procedure Laterality Date  . Back surgery      X 2 - lumbar  . Tonsillectomy    . Cataract extraction, bilateral    . Breast biopsy  B/L- said to be negative for malignancy.  . Colonoscopy    . Colonoscopy N/A 08/08/2013    Procedure: COLONOSCOPY;  Surgeon: [name], MD;  Location: [place];  Service: Endoscopy;  Laterality: N/A;  EGD is possible    Pulse 66  SpO2   Visit Diagnosis:  Difficulty walking  Activity intolerance  Generalized muscle weakness      Subjective Assessment -  07/17/14 1451    Symptoms Pt reported she went shopping this weekend and she is sore. Pt reported she performed HEP and walked a little bit this weekend. Pt denied falls or changes since last visit.   Currently in Pain? No/denies            [facility] Adult PT Treatment/Exercise - 07/17/14 1547    Ambulation/Gait   Ambulation/Gait Yes   Ambulation/Gait Assistance 4: Min guard   Ambulation/Gait Assistance Details Pt ambulated over even terrain with RW and rollator, while performing head turns, figure eights, and through narrow cone obstacles. VC's to improve left heel strike and step length during head turns. Pt demonstrated improved posture, safety, speed, and stride length with RW and rollator. SaO2 on RA after amb. decr. to 87% but quickly incr. to >91% with seated rest break and pursed lip breathing.   Ambulation Distance (Feet) --  400', 20' (obstacle), 230', 75x2   Assistive device None   Gait Pattern Decreased dorsiflexion - left;Decreased step length - left   Balance   Balance Assessed Yes   Dynamic Standing Balance   Dynamic Standing - Balance Support Right upper extremity supported;No upper extremity supported   Dynamic Standing - Level of Assistance Other (comment)  min guard   Dynamic Standing - Balance Activities Alternating  foot traps;Step ups   Dynamic Standing - Comments B LE: 4" alternating toe taps, cone taps x4/LE. VC's to improve weight shifting, pt was able to tap 7/8 cones without knocking a cone over.           PT Education - 07/17/14 1542    Education provided Yes   Education Details Educated pt on the importance of using rollator or RW at all times, to improve mobility and safety.   Person(s) Educated Patient;Caregiver(s)   Methods Explanation   Comprehension Verbalized understanding          PT Short Term Goals - 07/17/14 1545    PT SHORT TERM GOAL #1   Title Be independent in HEP to improve strength, balance and safety during functional mobility    Time 0   Period Days   Status Achieved   PT SHORT TERM GOAL #2   Title Assess BERG and write goal   Time 0   Period Days   Status Achieved   PT SHORT TERM GOAL #3   Title Ambulate 300' over even/uneven terrain, with LRAD and supervision to decrease falls risk   Time 0   Period Days   Status Not Met   PT SHORT TERM GOAL #4   Title perform all transfers with LRAD at MOD I level   Time 0   Period Days   Status Achieved   PT SHORT TERM GOAL #5   Title perform TUG</=16 seconds without AD to decrease falls risk   Time 0   Period Days   Status Not Met          PT Long Term Goals - 07/17/14 1545    PT LONG TERM GOAL #1   Title verbalize understanding of fall prevention strategeis within home environment. Target date: 07/26/14.   Status On-going   PT LONG TERM GOAL #2   Title assess BERG and write goal.   Time 0   Period Days   Status Achieved   PT LONG TERM GOAL #3   Title ambulate 400' over even/unveven terrain with LRAD and supervision to decrease falls risk. Target date: 07/26/14.   Baseline revised from 600' to 400' based on progress on 06/28/14.   Status Revised   PT LONG TERM GOAL #4   Title perform TUG,/=13 seconds without AD to decrease falls risk. Target date: 07/26/14.   Status On-going   PT LONG TERM GOAL #5   Title Improve ABC score by 10% to decrease fear of falling. Target date: 07/26/14.   Status On-going   PT LONG TERM GOAL #6   Title improve DGI score>19 to decrease risk for falls. Target date: 07/26/14.   Status On-going   PT LONG TERM GOAL #7   Title improve gait speed>/=2.39ft/sec to be a limited community ambulator. Target date: 07/26/14.   Status On-going   PT LONG TERM GOAL #8   Title pt will improve BERG balance score >/=50/56 to decrease falls risk. Target date: 07/26/14.   Status On-going          Plan - 07/17/14 1543    Clinical Impression Statement Pt demonstrated progress as she was able to ambulate in a safer manner, with improved  heel strike, stride length, and upright posture with rollator and RW. Pt would continue to benefit from skilled PT to improve safety during funcitonal mobilty.   Pt will benefit from skilled therapeutic intervention in order to improve on the following deficits Difficulty walking;Decreased activity tolerance;Decreased strength;Decreased mobility;Decreased balance  Rehab Potential Good   PT Frequency 2x / week   PT Duration 8 weeks   PT Treatment/Interventions ADLs/Self Care Home Management;Therapeutic activities;Patient/family education;Therapeutic exercise;Balance training;Gait training;Neuromuscular re-education   PT Next Visit Plan Continue Dynamic gait and balance (weight shifting) training.   PT Home Exercise Plan OTAGO   Consulted and Agree with Plan of Care Patient;Family member/caregiver   Family Member Consulted family friend                               Problem List Patient Active Problem List   Diagnosis Date Noted  . Diverticulosis of colon (without mention of hemorrhage) 08/08/2013  . Acute lower GI bleeding 08/07/2013  . Chronic renal disease, stage 4, severely decreased glomerular filtration rate between 15-29 mL/min/1.73 square meter 08/07/2013  . Acute posthemorrhagic anemia 08/07/2013  . Hypotension due to blood loss 08/07/2013  . CHF (congestive heart failure) 01/05/2012  . Hypertension 12/19/2011  . GERD (gastroesophageal reflux disease) 12/19/2011  . Hypoxia 12/18/2011  . DIABETES, TYPE 2 07/06/2008  . Anemia of chronic kidney failure 07/06/2008  . PULMONARY FIBROSIS ILD POST INFLAMMATORY CHRONIC 07/06/2008  . HYPERCHOLESTEROLEMIA 07/05/2008  . DEPRESSION 07/05/2008    Maruice Pieroni L 07/17/2014, 3:55 PM     Geoffry Paradise, PT,DPT 07/17/2014 3:55 PM Phone: [phone] Fax: [phone]

## 2014-07-18 ENCOUNTER — Encounter: Payer: Self-pay | Admitting: Vascular Surgery

## 2014-07-19 ENCOUNTER — Other Ambulatory Visit: Payer: Self-pay

## 2014-07-19 ENCOUNTER — Ambulatory Visit (INDEPENDENT_AMBULATORY_CARE_PROVIDER_SITE_OTHER)
Admission: RE | Admit: 2014-07-19 | Discharge: 2014-07-19 | Disposition: A | Discharge status: Home / Self Care | Payer: Medicare Other | Source: Ambulatory Visit | Attending: Vascular Surgery | Admitting: Vascular Surgery

## 2014-07-19 ENCOUNTER — Encounter: Payer: Self-pay | Admitting: Vascular Surgery

## 2014-07-19 ENCOUNTER — Ambulatory Visit: Payer: Medicare Other

## 2014-07-19 ENCOUNTER — Ambulatory Visit (HOSPITAL_COMMUNITY)
Admission: RE | Admit: 2014-07-19 | Discharge: 2014-07-19 | Disposition: A | Discharge status: Home / Self Care | Payer: Medicare Other | Source: Ambulatory Visit | Attending: Vascular Surgery | Admitting: Vascular Surgery

## 2014-07-19 ENCOUNTER — Ambulatory Visit (INDEPENDENT_AMBULATORY_CARE_PROVIDER_SITE_OTHER): Payer: Medicare Other | Admitting: Vascular Surgery

## 2014-07-19 VITALS — BP 135/75 | HR 63 | Ht 62.0 in | Wt 128.6 lb

## 2014-07-19 DIAGNOSIS — I129 Hypertensive chronic kidney disease with stage 1 through stage 4 chronic kidney disease, or unspecified chronic kidney disease: Secondary | ICD-10-CM | POA: Insufficient documentation

## 2014-07-19 DIAGNOSIS — Z01810 Encounter for preprocedural cardiovascular examination: Secondary | ICD-10-CM

## 2014-07-19 DIAGNOSIS — N184 Chronic kidney disease, stage 4 (severe): Secondary | ICD-10-CM | POA: Diagnosis not present

## 2014-07-19 DIAGNOSIS — E1121 Type 2 diabetes mellitus with diabetic nephropathy: Secondary | ICD-10-CM | POA: Diagnosis not present

## 2014-07-19 NOTE — Assessment & Plan Note (Signed)
Based on her vein map, most likely her only option for a fistula would be a basilic vein transposition on the left. If this vein were marginal I would probably do only a first stage basilic vein transposition. In addition, I have spoken to Dr. Mercy Moore. He will review her records and let us know if she needs an AV graft if an AV fistula is not possible. I have explained the indications for placement of an AV fistula or AV graft. I've explained that if at all possible we will place an AV fistula.  I have reviewed the risks of placement of an AV fistula including but not limited to: failure of the fistula to mature, need for subsequent interventions, and thrombosis. In addition I have reviewed the potential complications of placement of an AV graft. These risks include, but are not limited to, graft thrombosis, graft infection, wound healing problems, bleeding, arm swelling, and steal syndrome. All the patient's questions were answered and they are agreeable to proceed with surgery. Her surgery is scheduled for 08/03/2014.

## 2014-07-19 NOTE — Progress Notes (Signed)
 Patient ID: Susan Mccoy, female   DOB: 09/05/1931, 80 y.o.   MRN: [id number]  Reason for Consult: Evaluate for new hemodialysis access   Referred by Dr. Mattingly  Subjective:     HPI:  Susan Mccoy is a 80 y.o. female Who is not yet on dialysis. She has stage IV chronic kidney disease believed secondary to diabetes. I do not have her records from [facility]. She denies any recent uremic symptoms. This typically, she denies nausea, vomiting, fatigue, anorexia, or palpitations.  Past Medical History  Diagnosis Date  . Hypertension   . Thyroid disease   . Abnormal LFTs     Fatty liver by US  . Hypercholesterolemia   . Carpal tunnel syndrome   . DJD (degenerative joint disease)   . Shingles   . Meralgia paraesthetica   . GERD (gastroesophageal reflux disease)   . Pulmonary fibrosis   . Hemorrhoids   . Diverticulosis   . Normocytic normochromic anemia   . History of recurrent TIAs   . UTI (lower urinary tract infection)   . Complication of anesthesia     trouble with putting her to sleep  . CHF (congestive heart failure)   . Chronic renal disease, stage 4, severely decreased glomerular filtration rate between 15-29 mL/min/1.73 square meter   . Anemia of chronic kidney failure   . Diabetes mellitus without complication    Family History  Problem Relation Age of Onset  . Emphysema Father     smoker  . Diabetes Father   . Varicose Veins Father   . Heart disease Father     before age 60  . Heart attack Father   . Emphysema Brother     smoker  . Cancer Brother   . Varicose Veins Mother   . Cancer Sister   . Diabetes Sister   . Cancer Sister   . Cancer Brother    Past Surgical History  Procedure Laterality Date  . Back surgery      X 2 - lumbar  . Tonsillectomy    . Cataract extraction, bilateral    . Breast biopsy  B/L- said to be negative for malignancy.  . Colonoscopy    . Colonoscopy N/A 08/08/2013    Procedure: COLONOSCOPY;  Surgeon:  Daniel P Jacobs, MD;  Location: [facility];  Service: Endoscopy;  Laterality: N/A;  EGD is possible    Short Social History:  History  Substance Use Topics  . Smoking status: Former Smoker -- 2.00 packs/day for 40 years  . Smokeless tobacco: Never Used  . Alcohol Use: No    Allergies  Allergen Reactions  . Penicillins     REACTION: GI Upset  . Sulfonamide Derivatives     Unknown. Symptoms were when she was a teenager  . Celebrex [Celecoxib] Rash    Current Outpatient Prescriptions  Medication Sig Dispense Refill  . allopurinol (ZYLOPRIM) 300 MG tablet Take 150 mg by mouth daily.    . aspirin EC 81 MG tablet Take 81 mg by mouth daily.    . atenolol (TENORMIN) 25 MG tablet Take 25 mg by mouth daily.     . beta carotene w/minerals (OCUVITE) tablet Take 1 tablet by mouth daily.    . calcitRIOL (ROCALTROL) 0.25 MCG capsule Take 0.25 mcg by mouth every other day.    . cholecalciferol (VITAMIN D) 1000 UNITS tablet Take 1,000 Units by mouth daily.    . Cyanocobalamin (VITAMIN B-12 IJ) Inject 1 Syringe as directed   every 30 (thirty) days. As directed. Due 08/19/2013    . cyclobenzaprine (FLEXERIL) 10 MG tablet Take 5 mg by mouth at bedtime.    . ferrous sulfate 325 (65 FE) MG tablet Take 325 mg by mouth daily with breakfast.    . furosemide (LASIX) 40 MG tablet Take 20 mg by mouth daily.     . glipiZIDE (GLUCOTROL XL) 10 MG 24 hr tablet Take 10 mg by mouth daily.    . hydrOXYzine (ATARAX/VISTARIL) 25 MG tablet Take 25 mg by mouth 2 (two) times daily as needed.    . lansoprazole (PREVACID) 30 MG capsule Take 30 mg by mouth daily. Before a meal once a day    . primidone (MYSOLINE) 50 MG tablet Take 50 mg by mouth 2 (two) times daily.    . simvastatin (ZOCOR) 20 MG tablet      No current facility-administered medications for this visit.    Review of Systems  Constitutional: Negative for chills and fever.  Eyes: Negative for loss of vision.  Respiratory: Negative for cough and  wheezing.  Cardiovascular: Negative for chest pain, chest tightness, claudication, dyspnea with exertion, orthopnea and palpitations.  GI: Negative for blood in stool and vomiting.  GU: Negative for dysuria and hematuria.  Musculoskeletal: Negative for leg pain, joint pain and myalgias.  Skin: Negative for rash and wound.  Neurological: Negative for dizziness and speech difficulty.  Hematologic: Negative for bruises/bleeds easily. Psychiatric: Negative for depressed mood.        Objective:  Objective  Filed Vitals:   07/19/14 1142  BP: 135/75  Pulse: 63  Height: 5' 2" (1.575 m)  Weight: 128 lb 9.6 oz (58.333 kg)  SpO2: 100%   Body mass index is 23.52 kg/(m^2).  Physical Exam  Constitutional: She is oriented to person, place, and time. She appears well-developed and well-nourished.  HENT:  Head: Normocephalic and atraumatic.  Right Ear: External ear normal.  Neck: Neck supple. No JVD present. No thyromegaly present.  Cardiovascular: Normal rate, regular rhythm and normal heart sounds.  Exam reveals no friction rub.   No murmur heard. Pulmonary/Chest: Breath sounds normal. She has no wheezes. She has no rales.  Abdominal: Soft. Bowel sounds are normal. There is no tenderness.  Musculoskeletal: Normal range of motion. She exhibits no edema.  Lymphadenopathy:    She has no cervical adenopathy.  Neurological: She is alert and oriented to person, place, and time. She has normal strength. No sensory deficit.  Skin: No lesion and no rash noted.  Psychiatric: She has a normal mood and affect.    Data: ARTERIAL DOPPLER: I have independently interpreted her arterial Doppler study which shows triphasic Doppler signals in the right radial and ulnar positions with a normal Allen test. On the left side she has biphasic Doppler signals in the radial and ulnar position with a normal Allen test.  I have independently interpreted her upper extremity vein mapping. On the left side, the  forearm cephalic vein is very small and the upper arm cephalic vein is not visualized. The basilic vein looks reasonable in size. On the right side the forearm cephalic vein is small, the upper arm cephalic vein is marginal, and the basilic vein is marginal.      Assessment/Plan:     Chronic renal disease, stage 4, severely decreased glomerular filtration rate between 15-29 mL/min/1.73 square meter Based on her vein map, most likely her only option for a fistula would be a basilic vein transposition on the left.   If this vein were marginal I would probably do only a first stage basilic vein transposition. In addition, I have spoken to Dr. Mattingly. He will review her records and let us know if she needs an AV graft if an AV fistula is not possible. I have explained the indications for placement of an AV fistula or AV graft. I've explained that if at all possible we will place an AV fistula.  I have reviewed the risks of placement of an AV fistula including but not limited to: failure of the fistula to mature, need for subsequent interventions, and thrombosis. In addition I have reviewed the potential complications of placement of an AV graft. These risks include, but are not limited to, graft thrombosis, graft infection, wound healing problems, bleeding, arm swelling, and steal syndrome. All the patient's questions were answered and they are agreeable to proceed with surgery. Her surgery is scheduled for 08/03/2014.    Christopher S Dickson MD [facility]   

## 2014-07-21 ENCOUNTER — Ambulatory Visit: Payer: Medicare Other

## 2014-07-24 ENCOUNTER — Ambulatory Visit: Payer: Medicare Other

## 2014-07-24 DIAGNOSIS — M6281 Muscle weakness (generalized): Secondary | ICD-10-CM

## 2014-07-24 DIAGNOSIS — Z5189 Encounter for other specified aftercare: Secondary | ICD-10-CM | POA: Diagnosis not present

## 2014-07-24 DIAGNOSIS — R6889 Other general symptoms and signs: Secondary | ICD-10-CM

## 2014-07-24 DIAGNOSIS — R262 Difficulty in walking, not elsewhere classified: Secondary | ICD-10-CM

## 2014-07-24 NOTE — Therapy (Signed)
[facility] [address] [place], [place], [address] Phone: [phone]   Fax:  [phone]  Physical Therapy Treatment  Patient Details  Name: Susan Mccoy MRN: [id number] Date of Birth: 1932/03/29  Encounter Date: 07/24/2014      PT End of Session - 07/24/14 1543    Visit Number 12   Number of Visits 17   Date for PT Re-Evaluation 07/26/14   PT Start Time 1446   PT Stop Time 1528   PT Time Calculation (min) 42 min   Equipment Utilized During Treatment Gait belt   Activity Tolerance Patient tolerated treatment well   Behavior During Therapy [facility] for tasks assessed/performed      Past Medical History  Diagnosis Date  . Hypertension   . Thyroid disease   . Abnormal LFTs     Fatty liver by [place]  . Hypercholesterolemia   . Carpal tunnel syndrome   . DJD (degenerative joint disease)   . Shingles   . Meralgia paraesthetica   . GERD (gastroesophageal reflux disease)   . Pulmonary fibrosis   . Hemorrhoids   . Diverticulosis   . Normocytic normochromic anemia   . History of recurrent TIAs   . UTI (lower urinary tract infection)   . Complication of anesthesia     trouble with putting her to sleep  . CHF (congestive heart failure)   . Chronic renal disease, stage 4, severely decreased glomerular filtration rate between 15-29 mL/min/1.73 square meter   . Anemia of chronic kidney failure   . Diabetes mellitus without complication     Past Surgical History  Procedure Laterality Date  . Back surgery      X 2 - lumbar  . Tonsillectomy    . Cataract extraction, bilateral    . Breast biopsy  B/L- said to be negative for malignancy.  . Colonoscopy    . Colonoscopy N/A 08/08/2013    Procedure: COLONOSCOPY;  Surgeon: [name], MD;  Location: [facility];  Service: Endoscopy;  Laterality: N/A;  EGD is possible    There were no vitals taken for this visit.  Visit Diagnosis:  No diagnosis found.       Subjective Assessment - 07/24/14 1450    Symptoms Pt denied falls since last visit. Pt reported she is having surgery next week on her L arm for dialysis. Pt reported she's tired from having company this weekend.   Currently in Pain? No/denies  However, pt reported she has "soreness" all over but won't call it pain or rate it.                    [facility] Adult PT Treatment/Exercise - 07/24/14 1455    Ambulation/Gait   Ambulation/Gait Yes   Ambulation/Gait Assistance 4: Min guard   Ambulation/Gait Assistance Details Pt ambulated while performing head turns over even terrain. VC's to improve L heel strike, stride length. Pt required seated rest break to improve SaO2, see assessment for detail.   Ambulation Distance (Feet) --  75'x2, 117'   Assistive device None   Gait Pattern Decreased dorsiflexion - left;Decreased step length - left   Balance   Balance Assessed Yes   Dynamic Standing Balance   Dynamic Standing - Balance Support Right upper extremity supported;No upper extremity supported   Dynamic Standing - Level of Assistance 5: Stand by assistance;Other (comment)  min guard   Dynamic Standing - Balance Activities Other (comment)   Dynamic Standing - Comments Pt  ambulated over compliant surface (ant/lat/marching-4x7'/activity) with VC's to improve weight shifting and step height. Pt also ambulating in parallel bars with 0-1 UE support while performing weight shifting (ant. direction) over 1"-4" foam beams x6. VC's to improve posture and weight shifting onto leading foot to successfully clear beam.  Pt required seated rest breaks due to fatigue.   Knee/Hip Exercises: Aerobic   Stationary Bike SciFit Bike, level 2.0, 10 minutes to improve strength and endurance. VC's to use B LEs only and to fully extend knees. Pt went 0.26 miles in 10 minutes.                PT Education - 07/24/14 1543    Education provided Yes   Education Details PT reiterated the importance of  performing OTAGO 3x/week and ambulating every day.   Person(s) Educated Patient;Caregiver(s)   Methods Explanation   Comprehension Verbalized understanding          PT Short Term Goals - 07/17/14 1545    PT SHORT TERM GOAL #1   Title Be independent in HEP to improve strength, balance and safety during functional mobility   Time 0   Period Days   Status Achieved   PT SHORT TERM GOAL #2   Title Assess BERG and write goal   Time 0   Period Days   Status Achieved   PT SHORT TERM GOAL #3   Title Ambulate 300' over even/uneven terrain, with LRAD and supervision to decrease falls risk   Time 0   Period Days   Status Not Met   PT SHORT TERM GOAL #4   Title perform all transfers with LRAD at MOD I level   Time 0   Period Days   Status Achieved   PT SHORT TERM GOAL #5   Title perform TUG</=16 seconds without AD to decrease falls risk   Time 0   Period Days   Status Not Met           PT Long Term Goals - 07/17/14 1545    PT LONG TERM GOAL #1   Title verbalize understanding of fall prevention strategeis within home environment. Target date: 07/26/14.   Status On-going   PT LONG TERM GOAL #2   Title assess BERG and write goal.   Time 0   Period Days   Status Achieved   PT LONG TERM GOAL #3   Title ambulate 400' over even/unveven terrain with LRAD and supervision to decrease falls risk. Target date: 07/26/14.   Baseline revised from 600' to 400' based on progress on 06/28/14.   Status Revised   PT LONG TERM GOAL #4   Title perform TUG,/=13 seconds without AD to decrease falls risk. Target date: 07/26/14.   Status On-going   PT LONG TERM GOAL #5   Title Improve ABC score by 10% to decrease fear of falling. Target date: 07/26/14.   Status On-going   PT LONG TERM GOAL #6   Title improve DGI score>19 to decrease risk for falls. Target date: 07/26/14.   Status On-going   PT LONG TERM GOAL #7   Title improve gait speed>/=2.40f/sec to be a limited community ambulator.  Target date: 07/26/14.   Status On-going   PT LONG TERM GOAL #8   Title pt will improve BERG balance score >/=50/56 to decrease falls risk. Target date: 07/26/14.   Status On-going               Plan - 07/24/14 1544    Clinical  Impression Statement Pt demonstrated progress, as she ambulated over compliant surfaces without LOB episodes. Pt continues to be limited by fatigue and decreased SaO2, as she required seated rest breaks. Pt's SaO2 was 98% on RA prior to session, pt's SaO2 deceased to 85% on RA and required seated rest break, application of 2L of O2, and pursed lip breathing to >93%. Continue with POC.   Pt will benefit from skilled therapeutic intervention in order to improve on the following deficits Difficulty walking;Decreased activity tolerance;Decreased strength;Decreased mobility;Decreased balance   Rehab Potential Good   PT Frequency 2x / week   PT Duration 8 weeks   PT Treatment/Interventions ADLs/Self Care Home Management;Therapeutic activities;Patient/family education;Therapeutic exercise;Balance training;Gait training;Neuromuscular re-education   PT Next Visit Plan G-code, D/C, assess LTGs   PT Home Exercise Plan OTAGO   Consulted and Agree with Plan of Care Patient;Family member/caregiver   Family Member Consulted family friend        Problem List Patient Active Problem List   Diagnosis Date Noted  . Diverticulosis of colon (without mention of hemorrhage) 08/08/2013  . Acute lower GI bleeding 08/07/2013  . Chronic renal disease, stage 4, severely decreased glomerular filtration rate between 15-29 mL/min/1.73 square meter 08/07/2013  . Acute posthemorrhagic anemia 08/07/2013  . Hypotension due to blood loss 08/07/2013  . CHF (congestive heart failure) 01/05/2012  . Hypertension 12/19/2011  . GERD (gastroesophageal reflux disease) 12/19/2011  . Hypoxia 12/18/2011  . DIABETES, TYPE 2 07/06/2008  . Anemia of chronic kidney failure 07/06/2008  . PULMONARY  FIBROSIS ILD POST INFLAMMATORY CHRONIC 07/06/2008  . HYPERCHOLESTEROLEMIA 07/05/2008  . DEPRESSION 07/05/2008    Willo Yoon L 07/24/2014, 3:47 PM  [facility] [address] [place], [place], [address] Phone: [phone]   Fax:  [phone]    Geoffry Paradise, PT,DPT 07/24/2014 3:47 PM Phone: [phone] Fax: [phone]

## 2014-07-26 ENCOUNTER — Encounter (HOSPITAL_COMMUNITY): Payer: Self-pay | Admitting: Pharmacy Technician

## 2014-07-26 ENCOUNTER — Ambulatory Visit: Payer: Medicare Other

## 2014-07-26 DIAGNOSIS — Z5189 Encounter for other specified aftercare: Secondary | ICD-10-CM | POA: Diagnosis not present

## 2014-07-26 DIAGNOSIS — M6281 Muscle weakness (generalized): Secondary | ICD-10-CM

## 2014-07-26 DIAGNOSIS — R6889 Other general symptoms and signs: Secondary | ICD-10-CM

## 2014-07-26 DIAGNOSIS — R262 Difficulty in walking, not elsewhere classified: Secondary | ICD-10-CM

## 2014-07-26 NOTE — Therapy (Signed)
[facility] [address] [place], [place], [address] Phone: [phone]   Fax:  [phone]  Physical Therapy Treatment  Patient Details  Name: Susan Mccoy MRN: [id number] Date of Birth: 11/30/1931  Encounter Date: 07/26/2014      PT End of Session - 07/26/14 1655    Visit Number 13   Number of Visits 17   Date for PT Re-Evaluation 07/26/14   PT Start Time 1448   PT Stop Time 1530   PT Time Calculation (min) 42 min   Equipment Utilized During Treatment Gait belt   Activity Tolerance Patient tolerated treatment well   Behavior During Therapy [facility] for tasks assessed/performed      Past Medical History  Diagnosis Date  . Hypertension   . Thyroid disease   . Abnormal LFTs     Fatty liver by [place]  . Hypercholesterolemia   . Carpal tunnel syndrome   . DJD (degenerative joint disease)   . Shingles   . Meralgia paraesthetica   . GERD (gastroesophageal reflux disease)   . Pulmonary fibrosis   . Hemorrhoids   . Diverticulosis   . Normocytic normochromic anemia   . History of recurrent TIAs   . UTI (lower urinary tract infection)   . Complication of anesthesia     trouble with putting her to sleep  . CHF (congestive heart failure)   . Chronic renal disease, stage 4, severely decreased glomerular filtration rate between 15-29 mL/min/1.73 square meter   . Anemia of chronic kidney failure   . Diabetes mellitus without complication     Past Surgical History  Procedure Laterality Date  . Back surgery      X 2 - lumbar  . Tonsillectomy    . Cataract extraction, bilateral    . Breast biopsy  B/L- said to be negative for malignancy.  . Colonoscopy    . Colonoscopy N/A 08/08/2013    Procedure: COLONOSCOPY;  Surgeon: [name], MD;  Location: [facility];  Service: Endoscopy;  Laterality: N/A;  EGD is possible    There were no vitals taken for this visit.  Visit Diagnosis:  Difficulty walking  Activity  intolerance  Generalized muscle weakness      Subjective Assessment - 07/26/14 1453    Symptoms Pt denied falls or changes. "I feel sleepy from all of the company that I had at my home today."   Currently in Pain? No/denies                    [facility] Adult PT Treatment/Exercise - 07/26/14 1454    Ambulation/Gait   Ambulation/Gait Yes   Ambulation/Gait Assistance 5: Supervision   Ambulation/Gait Assistance Details Pt ambulated over even terrain on 2L of O2, with no LOB episodes. Pt unable to ambulate longer distances due to fatigue.   Ambulation Distance (Feet) --  115'x2, 100', 230'   Assistive device None   Gait Pattern Decreased dorsiflexion - left;Decreased step length - left   Gait velocity 1.39ft/sec.   Standardized Balance Assessment   Standardized Balance Assessment Berg Balance Test;Dynamic Gait Index   Berg Balance Test   Sit to Stand Able to stand without using hands and stabilize independently   Standing Unsupported Able to stand safely 2 minutes   Sitting with Back Unsupported but Feet Supported on Floor or Stool Able to sit safely and securely 2 minutes   Stand to Sit Sits safely with minimal use of hands   Transfers Able  to transfer safely, minor use of hands   Standing Unsupported with Eyes Closed Able to stand 10 seconds safely   Standing Ubsupported with Feet Together Able to place feet together independently and stand 1 minute safely   From Standing, Reach Forward with Outstretched Arm Can reach forward >12 cm safely (5")  7"   From Standing Position, Pick up Object from Bivalve to pick up shoe safely and easily   From Standing Position, Turn to Look Behind Over each Shoulder Looks behind from both sides and weight shifts well   Turn 360 Degrees Able to turn 360 degrees safely but slowly   Standing Unsupported, Alternately Place Feet on Step/Stool Able to stand independently and complete 8 steps >20 seconds   Standing Unsupported, One Foot in Front  Able to plae foot ahead of the other independently and hold 30 seconds   Standing on One Leg Tries to lift leg/unable to hold 3 seconds but remains standing independently   Total Score 48   Dynamic Gait Index   Level Surface Mild Impairment   Change in Gait Speed Mild Impairment   Gait with Horizontal Head Turns Mild Impairment   Gait with Vertical Head Turns Normal   Gait and Pivot Turn Mild Impairment   Step Over Obstacle Mild Impairment   Step Around Obstacles Normal   Steps Moderate Impairment   Total Score 17   Timed Up and Go Test   TUG Normal TUG   Normal TUG (seconds) 15.29  no AD                PT Education - 07/26/14 1655    Education provided Yes   Education Details Falls prevention handout. PT reiterated the importance of continuing with HEP after PT d/c.   Person(s) Educated Patient;Caregiver(s)   Methods Explanation   Comprehension Verbalized understanding          PT Short Term Goals - 07/26/14 1658    PT SHORT TERM GOAL #1   Title Be independent in HEP to improve strength, balance and safety during functional mobility   Time 0   Period Days   Status Achieved   PT SHORT TERM GOAL #2   Title Assess BERG and write goal   Time 0   Period Days   Status Achieved   PT SHORT TERM GOAL #3   Title Ambulate 300' over even/uneven terrain, with LRAD and supervision to decrease falls risk   Time 0   Period Days   Status Not Met   PT SHORT TERM GOAL #4   Title perform all transfers with LRAD at MOD I level   Time 0   Period Days   Status Achieved   PT SHORT TERM GOAL #5   Title perform TUG</=16 seconds without AD to decrease falls risk   Baseline met 07/26/14.   Time 0   Period Days   Status Achieved           PT Long Term Goals - 07/26/14 1659    PT LONG TERM GOAL #1   Title verbalize understanding of fall prevention strategeis within home environment. Target date: 07/26/14.   Status Achieved   PT LONG TERM GOAL #2   Title assess BERG and  write goal.   Time 0   Period Days   Status Achieved   PT LONG TERM GOAL #3   Title ambulate 400' over even/unveven terrain with LRAD and supervision to decrease falls risk. Target date: 07/26/14.  Baseline revised from 600' to 400' based on progress on 06/28/14.   Status Partially Met   PT LONG TERM GOAL #4   Title perform TUG,/=13 seconds without AD to decrease falls risk. Target date: Aug 13, 2014.   Status Partially Met   PT LONG TERM GOAL #5   Title Improve ABC score by 10% to decrease fear of falling. Target date: August 13, 2014.   Baseline score went from 27.5% to 52.5%   Status Achieved   PT LONG TERM GOAL #6   Title improve DGI score>19 to decrease risk for falls. Target date: 13-Aug-2014.   Baseline 17/24   Status Partially Met   PT LONG TERM GOAL #7   Title improve gait speed>/=2.24ft/sec to be a limited community ambulator. Target date: 08-13-2014.   Baseline 1.68ft/sec.   Status Partially Met   PT LONG TERM GOAL #8   Title pt will improve BERG balance score >/=50/56 to decrease falls risk. Target date: 13-Aug-2014.   Baseline 48/56   Status Partially Met               Plan - 2014/08/13 1657    Clinical Impression Statement Pt is discharging from PT today. See discharge summary for details.          G-Codes - 08-13-2014 1700    Functional Assessment Tool Used BERG: 48/56; TUG: 15.29 no AD; DGI: 17/24   Functional Limitation Mobility: Walking and moving around   Mobility: Walking and Moving Around Goal Status [phone]) At least 1 percent but less than 20 percent impaired, limited or restricted   Mobility: Walking and Moving Around Discharge Status [id number]) At least 20 percent but less than 40 percent impaired, limited or restricted      Problem List Patient Active Problem List   Diagnosis Date Noted  . Diverticulosis of colon (without mention of hemorrhage) 08/08/2013  . Acute lower GI bleeding 08/07/2013  . Chronic renal disease, stage 4, severely decreased glomerular  filtration rate between 15-29 mL/min/1.73 square meter 08/07/2013  . Acute posthemorrhagic anemia 08/07/2013  . Hypotension due to blood loss 08/07/2013  . CHF (congestive heart failure) 01/05/2012  . Hypertension 12/19/2011  . GERD (gastroesophageal reflux disease) 12/19/2011  . Hypoxia 12/18/2011  . DIABETES, TYPE 2 07/06/2008  . Anemia of chronic kidney failure 07/06/2008  . PULMONARY FIBROSIS ILD POST INFLAMMATORY CHRONIC 07/06/2008  . HYPERCHOLESTEROLEMIA 07/05/2008  . DEPRESSION 07/05/2008    Appolonia Ackert L 08/13/2014, 5:03 PM  [facility] [address] [place], [place], [address] Phone: [phone]   Fax:  [phone]  PHYSICAL THERAPY DISCHARGE SUMMARY  Visits from Start of Care: 13  Current functional level related to goals / functional outcomes:     PT Short Term Goals - 08-13-2014 1658    PT SHORT TERM GOAL #1   Title Be independent in HEP to improve strength, balance and safety during functional mobility   Time 0   Period Days   Status Achieved   PT SHORT TERM GOAL #2   Title Assess BERG and write goal   Time 0   Period Days   Status Achieved   PT SHORT TERM GOAL #3   Title Ambulate 300' over even/uneven terrain, with LRAD and supervision to decrease falls risk   Time 0   Period Days   Status Not Met   PT SHORT TERM GOAL #4   Title perform all transfers with LRAD at MOD I level   Time 0   Period Days  Status Achieved   PT SHORT TERM GOAL #5   Title perform TUG</=16 seconds without AD to decrease falls risk   Baseline met 07/26/14.   Time 0   Period Days   Status Achieved          PT Long Term Goals - 07/26/14 1659    PT LONG TERM GOAL #1   Title verbalize understanding of fall prevention strategeis within home environment. Target date: 07/26/14.   Status Achieved   PT LONG TERM GOAL #2   Title assess BERG and write goal.   Time 0   Period Days   Status Achieved   PT LONG  TERM GOAL #3   Title ambulate 400' over even/unveven terrain with LRAD and supervision to decrease falls risk. Target date: 07/26/14.   Baseline revised from 600' to 400' based on progress on 06/28/14.   Status Partially Met   PT LONG TERM GOAL #4   Title perform TUG,/=13 seconds without AD to decrease falls risk. Target date: 07/26/14.   Status Partially Met   PT LONG TERM GOAL #5   Title Improve ABC score by 10% to decrease fear of falling. Target date: 07/26/14.   Baseline score went from 27.5% to 52.5%   Status Achieved   PT LONG TERM GOAL #6   Title improve DGI score>19 to decrease risk for falls. Target date: 07/26/14.   Baseline 17/24   Status Partially Met   PT LONG TERM GOAL #7   Title improve gait speed>/=2.55ft/sec to be a limited community ambulator. Target date: 07/26/14.   Baseline 1.100ft/sec.   Status Partially Met   PT LONG TERM GOAL #8   Title pt will improve BERG balance score >/=50/56 to decrease falls risk. Target date: 07/26/14.   Baseline 48/56   Status Partially Met      Remaining deficits: Decreased dorsiflexion and stride length during ambulation. Pt also experiences decreased endurance. PT educated pt on the importance of performing HEP, however, pt with intermittent compliance. Pt's endurance could be limited by decreased SaO2 on room air.   Education / Equipment: HEP, falls prevention handout  Plan: Patient agrees to discharge.  Patient goals were partially met. Patient is being discharged due to being pleased with the current functional level.  ?????       Pt is being discharged, as she has met maximal functional gains, as pt is not compliant with HEP.     Geoffry Paradise, PT,DPT 07/26/2014 5:03 PM Phone: [phone] Fax: [phone]

## 2014-07-26 NOTE — Patient Instructions (Signed)

## 2014-07-31 ENCOUNTER — Ambulatory Visit: Payer: Medicare Other

## 2014-08-01 ENCOUNTER — Encounter (HOSPITAL_COMMUNITY): Payer: Self-pay | Admitting: *Deleted

## 2014-08-01 NOTE — Progress Notes (Signed)
Anesthesia Chart Review: SAME DAY WORK-UP.  Patient is a 80 year old female scheduled for left basilic vein transposition versus AVGG insertion on 08/03/14 by Dr. Scot Dock.  History includes former smoker, DM2, HTN, CKD stage IV not yet on HD, hypercholesterolemia, GERD, pulmonary fibrosis on night time O2, TIA with question of seizure (on primidone), CHF (patient unaware of diagnosis), Bipolar disorder, anemia, fatty liver, thyroid disease (not specified), lumbar surgery '06, admission for GI bleed (low HGB of 6.2; felt due to diverticular bleed) requiring PRBC 08/2013. For anesthesia history, there is written "trouble with putting her asleep" but she doesn't recall this. OSA screening score was 4. Nephrologist is with [facility] (Dr. Mercy Moore?). PCP is listed as Dr. Dwaine Deter.  EKG on 08/08/13 (during admission for GI bleed; negative troponin X 2) showed ST at 120 bpm, rightward axis, ST abnormality, more non-specific in inferior leads but consider ischemia in lateral leads.  Lateral ST abnormality appeared new or at least more prominent since 08/07/13 and 12/18/11. She denied any chest pain, SOB, or cardiac history today during her PAT phone interview.  08/08/13 Echo: - Left ventricle: There was severe concentric hypertrophy. Systolic function was vigorous. The estimated ejection fraction was in the range of 65% to 70%. Wall motion was normal; there were no regional wall motion abnormalities. The study is not technically sufficient to allow evaluation of LV diastolic function. - Aortic valve: Trileaflet. Sclerosis without stenosis. Mild regurgitation. - Mitral valve: The anterior mitral leaflet tip is sclerotic. There is SAM. Mild, posteriorly directed regurgitation. - Left atrium: Moderately dilated 58ml/m2. - Right ventricle: The cavity size was normal. Wall thickness was normal. Systolic function was normal. RV systolic pressure: 91QX Hg (S, est). - Tricuspid valve:  Moderate regurgitation. - Pulmonary arteries: PA peak pressure: 59mm Hg (S). - Inferior vena cava: The vessel was normal in size; the respirophasic diameter changes were in the normal range (= 50%); findings are consistent with normal central venous pressure.  1V CXR 08/07/13: Stable chronic findings of pulmonary fibrosis with asymmetric left basilar involvement. No acute superimposed process identified.  She is scheduled for an ISTAT4 on arrival.   Above reviewed with anesthesiologist Dr. Linna Caprice.  If patient remains asymptomatic from a CV/CHF standpoint then it is anticipated that she can proceed with this procedure.  Can hopefully be done with MAC or a supraclavicular block.  [facility] Short Stay [facility]/Anesthesiology Phone [phone] 08/01/2014 5:38 PM

## 2014-08-01 NOTE — Progress Notes (Signed)
   08/01/14 1333  OBSTRUCTIVE SLEEP APNEA  Have you ever been diagnosed with sleep apnea through a sleep study? No  Do you snore loudly (loud enough to be heard through closed doors)?  1  Do you often feel tired, fatigued, or sleepy during the daytime? 1  Has anyone observed you stop breathing during your sleep? 0  Do you have, or are you being treated for high blood pressure? 1  BMI more than 35 kg/m2? 0  Age over 77 years old? 1  Gender: 0  Obstructive Sleep Apnea Score 4  Score 4 or greater  Results sent to PCP

## 2014-08-01 NOTE — Progress Notes (Signed)
Pt denies any cardiac history, denies chest pain or sob. Pt does use O2 at night.

## 2014-08-02 ENCOUNTER — Ambulatory Visit (INDEPENDENT_AMBULATORY_CARE_PROVIDER_SITE_OTHER): Payer: Medicare Other | Admitting: Podiatry

## 2014-08-02 ENCOUNTER — Encounter: Payer: Self-pay | Admitting: Podiatry

## 2014-08-02 VITALS — BP 146/80 | HR 64 | Resp 12

## 2014-08-02 DIAGNOSIS — B351 Tinea unguium: Secondary | ICD-10-CM

## 2014-08-02 DIAGNOSIS — M79676 Pain in unspecified toe(s): Secondary | ICD-10-CM

## 2014-08-02 MED ORDER — CHLORHEXIDINE GLUCONATE CLOTH 2 % EX PADS: 6.0000 | MEDICATED_PAD | Freq: Once | CUTANEOUS | Status: DC

## 2014-08-02 MED ORDER — SODIUM CHLORIDE 0.9 % IV SOLN: INTRAVENOUS | Status: DC

## 2014-08-02 MED ORDER — MUPIROCIN 2 % EX OINT
1.0000 "application " | TOPICAL_OINTMENT | Freq: Once | CUTANEOUS | Status: AC
  Administered 2014-08-03: 1 via TOPICAL
  Filled 2014-08-02: qty 22

## 2014-08-02 MED ORDER — VANCOMYCIN HCL IN DEXTROSE 1-5 GM/200ML-% IV SOLN
1000.0000 mg | INTRAVENOUS | Status: AC
Start: 2014-08-02 — End: 2014-08-03
  Administered 2014-08-03: 1000 mg via INTRAVENOUS
  Filled 2014-08-02: qty 200

## 2014-08-02 NOTE — Patient Instructions (Signed)
Diabetes and Foot Care Diabetes may cause you to have problems because of poor blood supply (circulation) to your feet and legs. This may cause the skin on your feet to become thinner, break easier, and heal more slowly. Your skin may become dry, and the skin may peel and crack. You may also have nerve damage in your legs and feet causing decreased feeling in them. You may not notice minor injuries to your feet that could lead to infections or more serious problems. Taking care of your feet is one of the most important things you can do for yourself.  HOME CARE INSTRUCTIONS  Wear shoes at all times, even in the house. Do not go barefoot. Bare feet are easily injured.  Check your feet daily for blisters, cuts, and redness. If you cannot see the bottom of your feet, use a mirror or ask someone for help.  Wash your feet with warm water (do not use hot water) and mild soap. Then pat your feet and the areas between your toes until they are completely dry. Do not soak your feet as this can dry your skin.  Apply a moisturizing lotion or petroleum jelly (that does not contain alcohol and is unscented) to the skin on your feet and to dry, brittle toenails. Do not apply lotion between your toes.  Trim your toenails straight across. Do not dig under them or around the cuticle. File the edges of your nails with an emery board or nail file.  Do not cut corns or calluses or try to remove them with medicine.  Wear clean socks or stockings every day. Make sure they are not too tight. Do not wear knee-high stockings since they may decrease blood flow to your legs.  Wear shoes that fit properly and have enough cushioning. To break in new shoes, wear them for just a few hours a day. This prevents you from injuring your feet. Always look in your shoes before you put them on to be sure there are no objects inside.  Do not cross your legs. This may decrease the blood flow to your feet.  If you find a minor scrape,  cut, or break in the skin on your feet, keep it and the skin around it clean and dry. These areas may be cleansed with mild soap and water. Do not cleanse the area with peroxide, alcohol, or iodine.  When you remove an adhesive bandage, be sure not to damage the skin around it.  If you have a wound, look at it several times a day to make sure it is healing.  Do not use heating pads or hot water bottles. They may burn your skin. If you have lost feeling in your feet or legs, you may not know it is happening until it is too late.  Make sure your health care provider performs a complete foot exam at least annually or more often if you have foot problems. Report any cuts, sores, or bruises to your health care provider immediately. SEEK MEDICAL CARE IF:   You have an injury that is not healing.  You have cuts or breaks in the skin.  You have an ingrown nail.  You notice redness on your legs or feet.  You feel burning or tingling in your legs or feet.  You have pain or cramps in your legs and feet.  Your legs or feet are numb.  Your feet always feel cold. SEEK IMMEDIATE MEDICAL CARE IF:   There is increasing redness,   swelling, or pain in or around a wound.  There is a red line that goes up your leg.  Pus is coming from a wound.  You develop a fever or as directed by your health care provider.  You notice a bad smell coming from an ulcer or wound. Document Released: 07/18/2000 Document Revised: 03/23/2013 Document Reviewed: 12/28/2012 ExitCare Patient Information 2015 [employer]. This information is not intended to replace advice given to you by your health care provider. Make sure you discuss any questions you have with your health care provider.  

## 2014-08-02 NOTE — Progress Notes (Signed)
Patient ID: Susan Mccoy, female   DOB: 1932/01/06, 80 y.o.   MRN: [id number]  Subjective: This patient presents for ongoing debridement of painful toenails and walking wearing shoes  Objective: The toenails are incurvated, hypertrophic, discolored and tender to palpation 6-10  Assessment: Symptomatic onychomycoses 6-10  Plan: Debridement of toenails 10 without a bleeding  Reappoint 3 months

## 2014-08-03 ENCOUNTER — Ambulatory Visit (HOSPITAL_COMMUNITY): Payer: Medicare Other | Admitting: Vascular Surgery

## 2014-08-03 ENCOUNTER — Encounter (HOSPITAL_COMMUNITY)
Admission: RE | Disposition: A | Discharge status: Home / Self Care | Payer: Self-pay | Source: Ambulatory Visit | Attending: Vascular Surgery

## 2014-08-03 ENCOUNTER — Ambulatory Visit (HOSPITAL_COMMUNITY)
Admission: RE | Admit: 2014-08-03 | Discharge: 2014-08-03 | Disposition: A | Discharge status: Home / Self Care | Payer: Medicare Other | Source: Ambulatory Visit | Attending: Vascular Surgery | Admitting: Vascular Surgery

## 2014-08-03 ENCOUNTER — Encounter (HOSPITAL_COMMUNITY): Payer: Self-pay | Admitting: *Deleted

## 2014-08-03 DIAGNOSIS — E079 Disorder of thyroid, unspecified: Secondary | ICD-10-CM | POA: Diagnosis not present

## 2014-08-03 DIAGNOSIS — Z794 Long term (current) use of insulin: Secondary | ICD-10-CM | POA: Insufficient documentation

## 2014-08-03 DIAGNOSIS — F319 Bipolar disorder, unspecified: Secondary | ICD-10-CM | POA: Insufficient documentation

## 2014-08-03 DIAGNOSIS — K760 Fatty (change of) liver, not elsewhere classified: Secondary | ICD-10-CM | POA: Insufficient documentation

## 2014-08-03 DIAGNOSIS — Z836 Family history of other diseases of the respiratory system: Secondary | ICD-10-CM | POA: Insufficient documentation

## 2014-08-03 DIAGNOSIS — Z882 Allergy status to sulfonamides status: Secondary | ICD-10-CM | POA: Insufficient documentation

## 2014-08-03 DIAGNOSIS — Z8673 Personal history of transient ischemic attack (TIA), and cerebral infarction without residual deficits: Secondary | ICD-10-CM | POA: Diagnosis not present

## 2014-08-03 DIAGNOSIS — J8410 Pulmonary fibrosis, unspecified: Secondary | ICD-10-CM | POA: Diagnosis not present

## 2014-08-03 DIAGNOSIS — Z8249 Family history of ischemic heart disease and other diseases of the circulatory system: Secondary | ICD-10-CM | POA: Insufficient documentation

## 2014-08-03 DIAGNOSIS — E780 Pure hypercholesterolemia: Secondary | ICD-10-CM | POA: Insufficient documentation

## 2014-08-03 DIAGNOSIS — Z833 Family history of diabetes mellitus: Secondary | ICD-10-CM | POA: Insufficient documentation

## 2014-08-03 DIAGNOSIS — Z87891 Personal history of nicotine dependence: Secondary | ICD-10-CM | POA: Insufficient documentation

## 2014-08-03 DIAGNOSIS — E119 Type 2 diabetes mellitus without complications: Secondary | ICD-10-CM | POA: Insufficient documentation

## 2014-08-03 DIAGNOSIS — K219 Gastro-esophageal reflux disease without esophagitis: Secondary | ICD-10-CM | POA: Insufficient documentation

## 2014-08-03 DIAGNOSIS — Z880 Allergy status to penicillin: Secondary | ICD-10-CM | POA: Diagnosis not present

## 2014-08-03 DIAGNOSIS — N186 End stage renal disease: Secondary | ICD-10-CM | POA: Diagnosis not present

## 2014-08-03 DIAGNOSIS — R0602 Shortness of breath: Secondary | ICD-10-CM | POA: Insufficient documentation

## 2014-08-03 DIAGNOSIS — D631 Anemia in chronic kidney disease: Secondary | ICD-10-CM | POA: Diagnosis not present

## 2014-08-03 DIAGNOSIS — R569 Unspecified convulsions: Secondary | ICD-10-CM | POA: Insufficient documentation

## 2014-08-03 DIAGNOSIS — Z809 Family history of malignant neoplasm, unspecified: Secondary | ICD-10-CM | POA: Insufficient documentation

## 2014-08-03 DIAGNOSIS — N184 Chronic kidney disease, stage 4 (severe): Secondary | ICD-10-CM

## 2014-08-03 DIAGNOSIS — I120 Hypertensive chronic kidney disease with stage 5 chronic kidney disease or end stage renal disease: Secondary | ICD-10-CM | POA: Diagnosis not present

## 2014-08-03 DIAGNOSIS — I509 Heart failure, unspecified: Secondary | ICD-10-CM | POA: Diagnosis not present

## 2014-08-03 DIAGNOSIS — R7989 Other specified abnormal findings of blood chemistry: Secondary | ICD-10-CM | POA: Insufficient documentation

## 2014-08-03 DIAGNOSIS — Z888 Allergy status to other drugs, medicaments and biological substances status: Secondary | ICD-10-CM | POA: Diagnosis not present

## 2014-08-03 DIAGNOSIS — Z452 Encounter for adjustment and management of vascular access device: Secondary | ICD-10-CM | POA: Diagnosis present

## 2014-08-03 DIAGNOSIS — M1990 Unspecified osteoarthritis, unspecified site: Secondary | ICD-10-CM | POA: Diagnosis not present

## 2014-08-03 HISTORY — DX: Bipolar disorder, unspecified: F31.9

## 2014-08-03 HISTORY — DX: Constipation, unspecified: K59.00

## 2014-08-03 HISTORY — DX: Unspecified macular degeneration: H35.30

## 2014-08-03 HISTORY — PX: BASCILIC VEIN TRANSPOSITION: SHX5742

## 2014-08-03 HISTORY — DX: Unspecified convulsions: R56.9

## 2014-08-03 LAB — SURGICAL PCR SCREEN
MRSA, PCR: NEGATIVE
Staphylococcus aureus: NEGATIVE

## 2014-08-03 LAB — POCT I-STAT 4, (NA,K, GLUC, HGB,HCT)
GLUCOSE: 116 mg/dL — AB (ref 70–99)
HCT: 38 % (ref 36.0–46.0)
Hemoglobin: 12.9 g/dL (ref 12.0–15.0)
POTASSIUM: 5 mmol/L (ref 3.5–5.1)
SODIUM: 139 mmol/L (ref 135–145)

## 2014-08-03 LAB — GLUCOSE, CAPILLARY: GLUCOSE-CAPILLARY: 150 mg/dL — AB (ref 70–99)

## 2014-08-03 SURGERY — TRANSPOSITION, VEIN, BASILIC
Anesthesia: Monitor Anesthesia Care | Site: Arm Lower | Laterality: Left

## 2014-08-03 MED ORDER — ONDANSETRON HCL 4 MG/2ML IJ SOLN
INTRAMUSCULAR | Status: AC
  Filled 2014-08-03: qty 2

## 2014-08-03 MED ORDER — FENTANYL CITRATE 0.05 MG/ML IJ SOLN: 25.0000 ug | INTRAMUSCULAR | Status: DC | PRN

## 2014-08-03 MED ORDER — HEPARIN SODIUM (PORCINE) 1000 UNIT/ML IJ SOLN
INTRAMUSCULAR | Status: DC | PRN
  Administered 2014-08-03: 4000 [IU] via INTRAVENOUS

## 2014-08-03 MED ORDER — LIDOCAINE HCL (CARDIAC) 20 MG/ML IV SOLN
INTRAVENOUS | Status: AC
  Filled 2014-08-03: qty 5

## 2014-08-03 MED ORDER — SODIUM CHLORIDE 0.9 % IJ SOLN
INTRAMUSCULAR | Status: AC
  Filled 2014-08-03: qty 10

## 2014-08-03 MED ORDER — LIDOCAINE HCL (PF) 1 % IJ SOLN
INTRAMUSCULAR | Status: AC
  Filled 2014-08-03: qty 30

## 2014-08-03 MED ORDER — LIDOCAINE HCL (PF) 1 % IJ SOLN
INTRAMUSCULAR | Status: DC | PRN
  Administered 2014-08-03: 26 mL

## 2014-08-03 MED ORDER — SODIUM CHLORIDE 0.9 % IV SOLN
INTRAVENOUS | Status: DC | PRN
Start: 2014-08-03 — End: 2014-08-03
  Administered 2014-08-03: 07:00:00 via INTRAVENOUS

## 2014-08-03 MED ORDER — FENTANYL CITRATE 0.05 MG/ML IJ SOLN
INTRAMUSCULAR | Status: AC
  Filled 2014-08-03: qty 5

## 2014-08-03 MED ORDER — PROTAMINE SULFATE 10 MG/ML IV SOLN
INTRAVENOUS | Status: DC | PRN
  Administered 2014-08-03: 20 mg via INTRAVENOUS

## 2014-08-03 MED ORDER — PROPOFOL INFUSION 10 MG/ML OPTIME
INTRAVENOUS | Status: DC | PRN
  Administered 2014-08-03: 50 ug/kg/min via INTRAVENOUS

## 2014-08-03 MED ORDER — SUCCINYLCHOLINE CHLORIDE 20 MG/ML IJ SOLN
INTRAMUSCULAR | Status: AC
  Filled 2014-08-03: qty 1

## 2014-08-03 MED ORDER — 0.9 % SODIUM CHLORIDE (POUR BTL) OPTIME
TOPICAL | Status: DC | PRN
  Administered 2014-08-03: 1000 mL

## 2014-08-03 MED ORDER — HEPARIN SODIUM (PORCINE) 5000 UNIT/ML IJ SOLN
INTRAMUSCULAR | Status: DC | PRN
  Administered 2014-08-03: 500 mL

## 2014-08-03 MED ORDER — ONDANSETRON HCL 4 MG/2ML IJ SOLN
INTRAMUSCULAR | Status: DC | PRN
  Administered 2014-08-03: 4 mg via INTRAVENOUS

## 2014-08-03 MED ORDER — THROMBIN 20000 UNITS EX SOLR
CUTANEOUS | Status: AC
  Filled 2014-08-03: qty 20000

## 2014-08-03 MED ORDER — PROTAMINE SULFATE 10 MG/ML IV SOLN
INTRAVENOUS | Status: AC
  Filled 2014-08-03: qty 5

## 2014-08-03 MED ORDER — MIDAZOLAM HCL 2 MG/2ML IJ SOLN
INTRAMUSCULAR | Status: AC
  Filled 2014-08-03: qty 2

## 2014-08-03 MED ORDER — ACETAMINOPHEN 325 MG PO TABS: 325.0000 mg | ORAL_TABLET | ORAL | Status: DC | PRN

## 2014-08-03 MED ORDER — FENTANYL CITRATE 0.05 MG/ML IJ SOLN
INTRAMUSCULAR | Status: DC | PRN
  Administered 2014-08-03 (×2): 25 ug via INTRAVENOUS

## 2014-08-03 MED ORDER — HYDROCODONE-ACETAMINOPHEN 5-325 MG PO TABS: 1.0000 | ORAL_TABLET | Freq: Four times a day (QID) | ORAL | Status: DC | PRN

## 2014-08-03 MED ORDER — ACETAMINOPHEN 160 MG/5ML PO SOLN
325.0000 mg | ORAL | Status: DC | PRN
Start: 2014-08-03 — End: 2014-08-03
  Filled 2014-08-03: qty 20.3

## 2014-08-03 MED ORDER — ROCURONIUM BROMIDE 50 MG/5ML IV SOLN
INTRAVENOUS | Status: AC
  Filled 2014-08-03: qty 1

## 2014-08-03 MED ORDER — PROPOFOL 10 MG/ML IV BOLUS
INTRAVENOUS | Status: AC
  Filled 2014-08-03: qty 20

## 2014-08-03 SURGICAL SUPPLY — 39 items
CANISTER SUCTION 2500CC (MISCELLANEOUS) ×3 IMPLANT
CANNULA VESSEL 3MM 2 BLNT TIP (CANNULA) ×2 IMPLANT
CLIP TI MEDIUM 24 (CLIP) ×3 IMPLANT
CLIP TI WIDE RED SMALL 24 (CLIP) ×3 IMPLANT
COVER PROBE W GEL 5X96 (DRAPES) ×3 IMPLANT
COVER SURGICAL LIGHT HANDLE (MISCELLANEOUS) ×3 IMPLANT
DECANTER SPIKE VIAL GLASS SM (MISCELLANEOUS) ×3 IMPLANT
DRAIN PENROSE 1/2X12 LTX STRL (WOUND CARE) IMPLANT
ELECT REM PT RETURN 9FT ADLT (ELECTROSURGICAL) ×3
ELECTRODE REM PT RTRN 9FT ADLT (ELECTROSURGICAL) ×1 IMPLANT
GLOVE BIO SURGEON STRL SZ 6.5 (GLOVE) ×1 IMPLANT
GLOVE BIO SURGEON STRL SZ7.5 (GLOVE) ×3 IMPLANT
GLOVE BIO SURGEONS STRL SZ 6.5 (GLOVE) ×1
GLOVE BIOGEL PI IND STRL 6.5 (GLOVE) IMPLANT
GLOVE BIOGEL PI IND STRL 7.0 (GLOVE) IMPLANT
GLOVE BIOGEL PI IND STRL 8 (GLOVE) ×1 IMPLANT
GLOVE BIOGEL PI INDICATOR 6.5 (GLOVE) ×4
GLOVE BIOGEL PI INDICATOR 7.0 (GLOVE) ×6
GLOVE BIOGEL PI INDICATOR 8 (GLOVE) ×2
GLOVE ECLIPSE 6.5 STRL STRAW (GLOVE) ×2 IMPLANT
GOWN BRE IMP SLV AUR XL STRL (GOWN DISPOSABLE) ×3 IMPLANT
GOWN STRL REUS W/ TWL LRG LVL3 (GOWN DISPOSABLE) ×3 IMPLANT
GOWN STRL REUS W/TWL LRG LVL3 (GOWN DISPOSABLE) ×9
GRAFT GORETEX STRT 4-7X45 (Vascular Products) ×3 IMPLANT
KIT BASIN OR (CUSTOM PROCEDURE TRAY) ×3 IMPLANT
KIT ROOM TURNOVER OR (KITS) ×3 IMPLANT
LIQUID BAND (GAUZE/BANDAGES/DRESSINGS) ×3 IMPLANT
NS IRRIG 1000ML POUR BTL (IV SOLUTION) ×3 IMPLANT
PACK CV ACCESS (CUSTOM PROCEDURE TRAY) ×3 IMPLANT
PAD ARMBOARD 7.5X6 YLW CONV (MISCELLANEOUS) ×6 IMPLANT
PROBE PENCIL 8 MHZ STRL DISP (MISCELLANEOUS) ×2 IMPLANT
SPONGE SURGIFOAM ABS GEL 100 (HEMOSTASIS) IMPLANT
SUT PROLENE 6 0 BV (SUTURE) ×9 IMPLANT
SUT SILK 2 0 SH (SUTURE) ×3 IMPLANT
SUT VIC AB 3-0 SH 27 (SUTURE) ×6
SUT VIC AB 3-0 SH 27X BRD (SUTURE) ×1 IMPLANT
SUT VICRYL 4-0 PS2 18IN ABS (SUTURE) ×3 IMPLANT
UNDERPAD 30X30 INCONTINENT (UNDERPADS AND DIAPERS) ×3 IMPLANT
WATER STERILE IRR 1000ML POUR (IV SOLUTION) ×3 IMPLANT

## 2014-08-03 NOTE — Anesthesia Postprocedure Evaluation (Signed)
  Anesthesia Post-op Note  Patient: Susan Mccoy  Procedure(s) Performed: Procedure(s):  INSERTION of Left Arm Gortex graft (Left)  Patient Location: PACU  Anesthesia Type:MAC  Level of Consciousness: awake  Airway and Oxygen Therapy: Patient Spontanous Breathing and Patient connected to nasal cannula oxygen  Post-op Pain: none  Post-op Assessment: Post-op Vital signs reviewed, Patient's Cardiovascular Status Stable, Respiratory Function Stable, Patent Airway, No signs of Nausea or vomiting and Pain level controlled  Post-op Vital Signs: Reviewed and stable  Last Vitals:  Filed Vitals:   08/03/14 1006  BP: 148/48  Pulse: 67  Temp:   Resp: 15    Complications: No apparent anesthesia complications

## 2014-08-03 NOTE — Transfer of Care (Signed)
Immediate Anesthesia Transfer of Care Note  Patient: Susan Mccoy  Procedure(s) Performed: Procedure(s):  INSERTION of Left Arm Gortex graft (Left)  Patient Location: PACU  Anesthesia Type:MAC  Level of Consciousness: awake, alert  and oriented  Airway & Oxygen Therapy: Patient Spontanous Breathing and Patient connected to nasal cannula oxygen  Post-op Assessment: Report given to PACU RN, Post -op Vital signs reviewed and stable and Patient moving all extremities X 4  Post vital signs: Reviewed and stable  Complications: No apparent anesthesia complications

## 2014-08-03 NOTE — Anesthesia Procedure Notes (Signed)
Procedure Name: MAC Date/Time: 08/03/2014 7:35 AM Performed by: Carola Frost Pre-anesthesia Checklist: Patient identified, Timeout performed, Emergency Drugs available, Suction available and Patient being monitored Patient Re-evaluated:Patient Re-evaluated prior to inductionOxygen Delivery Method: Simple face mask Intubation Type: IV induction Placement Confirmation: positive ETCO2 and breath sounds checked- equal and bilateral Dental Injury: Teeth and Oropharynx as per pre-operative assessment

## 2014-08-03 NOTE — Discharge Instructions (Signed)
What to eat: ° °For your first meals, you should eat lightly; only small meals initially.  If you do not have nausea, you may eat larger meals.  Avoid spicy, greasy and heavy food.   ° °General Anesthesia, Adult, Care After  °Refer to this sheet in the next few weeks. These instructions provide you with information on caring for yourself after your procedure. Your health care provider may also give you more specific instructions. Your treatment has been planned according to current medical practices, but problems sometimes occur. Call your health care provider if you have any problems or questions after your procedure.  °WHAT TO EXPECT AFTER THE PROCEDURE  °After the procedure, it is typical to experience:  °Sleepiness.  °Nausea and vomiting. °HOME CARE INSTRUCTIONS  °For the first 24 hours after general anesthesia:  °Have a responsible person with you.  °Do not drive a car. If you are alone, do not take public transportation.  °Do not drink alcohol.  °Do not take medicine that has not been prescribed by your health care provider.  °Do not sign important papers or make important decisions.  °You may resume a normal diet and activities as directed by your health care provider.  °Change bandages (dressings) as directed.  °If you have questions or problems that seem related to general anesthesia, call the hospital and ask for the anesthetist or anesthesiologist on call. °SEEK MEDICAL CARE IF:  °You have nausea and vomiting that continue the day after anesthesia.  °You develop a rash. °SEEK IMMEDIATE MEDICAL CARE IF:  °You have difficulty breathing.  °You have chest pain.  °You have any allergic problems. °Document Released: 10/27/2000 Document Revised: 03/23/2013 Document Reviewed: 02/03/2013  °ExitCare® Patient Information ©2014 [employer].  ° °Tissue Adhesive Wound Care  ° ° °Some cuts and wounds can be closed with tissue adhesive. Adhesive is like glue. It holds the skin together and helps a wound heal faster.  This adhesive goes away on its own as the wound heals.  °HOME CARE  °Showers are allowed. Do not soak the wound in water. Do not take baths, swim, or use hot tubs. Do not use soaps or creams on your wound.  °If a bandage (dressing) was put on, change it as often as told by your doctor.  °Keep the bandage dry.  °Do not scratch, pick, or rub the adhesive.  °Do not put tape over the adhesive. The adhesive could come off.  °Protect the wound from another injury.  °Protect the wound from sun and tanning beds.  °Only take medicine as told by your doctor.  °Keep all doctor visits as told. °GET HELP RIGHT AWAY IF:  °Your wound is red, puffy (swollen), hot, or tender.  °You get a rash after the glue is put on.  °You have more pain in the wound.  °You have a red streak going away from the wound.  °You have yellowish-white fluid (pus) coming from the wound.  °You have more bleeding.  °You have a fever.  °You have chills and start to shake.  °You notice a bad smell coming from the wound.  °Your wound or adhesive breaks open. °MAKE SURE YOU:  °Understand these instructions.  °Will watch your condition.  °Will get help right away if you are not doing well or get worse. °Document Released: 04/29/2008 Document Revised: 05/11/2013 Document Reviewed: 02/09/2013  °ExitCare® Patient Information ©2015 [employer]. This information is not intended to replace advice given to you by your health care provider.   Make sure you discuss any questions you have with your health care provider.  ° ° °

## 2014-08-03 NOTE — H&P (View-Only) (Signed)
Patient ID: Susan Mccoy, female   DOB: 28-Dec-1931, 80 y.o.   MRN: [id number]  Reason for Consult: Evaluate for new hemodialysis access   Referred by Dr. Mercy Moore  Subjective:     HPI:  Susan Mccoy is a 80 y.o. female Who is not yet on dialysis. She has stage IV chronic kidney disease believed secondary to diabetes. I do not have her records from [facility]. She denies any recent uremic symptoms. This typically, she denies nausea, vomiting, fatigue, anorexia, or palpitations.  Past Medical History  Diagnosis Date  . Hypertension   . Thyroid disease   . Abnormal LFTs     Fatty liver by [place]  . Hypercholesterolemia   . Carpal tunnel syndrome   . DJD (degenerative joint disease)   . Shingles   . Meralgia paraesthetica   . GERD (gastroesophageal reflux disease)   . Pulmonary fibrosis   . Hemorrhoids   . Diverticulosis   . Normocytic normochromic anemia   . History of recurrent TIAs   . UTI (lower urinary tract infection)   . Complication of anesthesia     trouble with putting her to sleep  . CHF (congestive heart failure)   . Chronic renal disease, stage 4, severely decreased glomerular filtration rate between 15-29 mL/min/1.73 square meter   . Anemia of chronic kidney failure   . Diabetes mellitus without complication    Family History  Problem Relation Age of Onset  . Emphysema Father     smoker  . Diabetes Father   . Varicose Veins Father   . Heart disease Father     before age 19  . Heart attack Father   . Emphysema Brother     smoker  . Cancer Brother   . Varicose Veins Mother   . Cancer Sister   . Diabetes Sister   . Cancer Sister   . Cancer Brother    Past Surgical History  Procedure Laterality Date  . Back surgery      X 2 - lumbar  . Tonsillectomy    . Cataract extraction, bilateral    . Breast biopsy  B/L- said to be negative for malignancy.  . Colonoscopy    . Colonoscopy N/A 08/08/2013    Procedure: COLONOSCOPY;  Surgeon:  [name], MD;  Location: [facility];  Service: Endoscopy;  Laterality: N/A;  EGD is possible    Short Social History:  History  Substance Use Topics  . Smoking status: Former Smoker -- 2.00 packs/day for 40 years  . Smokeless tobacco: Never Used  . Alcohol Use: No    Allergies  Allergen Reactions  . Penicillins     REACTION: GI Upset  . Sulfonamide Derivatives     Unknown. Symptoms were when she was a teenager  . Celebrex [Celecoxib] Rash    Current Outpatient Prescriptions  Medication Sig Dispense Refill  . allopurinol (ZYLOPRIM) 300 MG tablet Take 150 mg by mouth daily.    Marland Kitchen aspirin EC 81 MG tablet Take 81 mg by mouth daily.    Marland Kitchen atenolol (TENORMIN) 25 MG tablet Take 25 mg by mouth daily.     . beta carotene w/minerals (OCUVITE) tablet Take 1 tablet by mouth daily.    . calcitRIOL (ROCALTROL) 0.25 MCG capsule Take 0.25 mcg by mouth every other day.    . cholecalciferol (VITAMIN D) 1000 UNITS tablet Take 1,000 Units by mouth daily.    . Cyanocobalamin (VITAMIN B-12 IJ) Inject 1 Syringe as directed  every 30 (thirty) days. As directed. Due 08/19/2013    . cyclobenzaprine (FLEXERIL) 10 MG tablet Take 5 mg by mouth at bedtime.    . ferrous sulfate 325 (65 FE) MG tablet Take 325 mg by mouth daily with breakfast.    . furosemide (LASIX) 40 MG tablet Take 20 mg by mouth daily.     Marland Kitchen glipiZIDE (GLUCOTROL XL) 10 MG 24 hr tablet Take 10 mg by mouth daily.    . hydrOXYzine (ATARAX/VISTARIL) 25 MG tablet Take 25 mg by mouth 2 (two) times daily as needed.    . lansoprazole (PREVACID) 30 MG capsule Take 30 mg by mouth daily. Before a meal once a day    . primidone (MYSOLINE) 50 MG tablet Take 50 mg by mouth 2 (two) times daily.    . simvastatin (ZOCOR) 20 MG tablet      No current facility-administered medications for this visit.    Review of Systems  Constitutional: Negative for chills and fever.  Eyes: Negative for loss of vision.  Respiratory: Negative for cough and  wheezing.  Cardiovascular: Negative for chest pain, chest tightness, claudication, dyspnea with exertion, orthopnea and palpitations.  GI: Negative for blood in stool and vomiting.  GU: Negative for dysuria and hematuria.  Musculoskeletal: Negative for leg pain, joint pain and myalgias.  Skin: Negative for rash and wound.  Neurological: Negative for dizziness and speech difficulty.  Hematologic: Negative for bruises/bleeds easily. Psychiatric: Negative for depressed mood.        Objective:  Objective  Filed Vitals:   07/19/14 1142  BP: 135/75  Pulse: 63  Height: 5\' 2"  (1.575 m)  Weight: 128 lb 9.6 oz (58.333 kg)  SpO2: 100%   Body mass index is 23.52 kg/(m^2).  Physical Exam  Constitutional: She is oriented to person, place, and time. She appears well-developed and well-nourished.  HENT:  Head: Normocephalic and atraumatic.  Right Ear: External ear normal.  Neck: Neck supple. No JVD present. No thyromegaly present.  Cardiovascular: Normal rate, regular rhythm and normal heart sounds.  Exam reveals no friction rub.   No murmur heard. Pulmonary/Chest: Breath sounds normal. She has no wheezes. She has no rales.  Abdominal: Soft. Bowel sounds are normal. There is no tenderness.  Musculoskeletal: Normal range of motion. She exhibits no edema.  Lymphadenopathy:    She has no cervical adenopathy.  Neurological: She is alert and oriented to person, place, and time. She has normal strength. No sensory deficit.  Skin: No lesion and no rash noted.  Psychiatric: She has a normal mood and affect.    Data: ARTERIAL DOPPLER: I have independently interpreted her arterial Doppler study which shows triphasic Doppler signals in the right radial and ulnar positions with a normal Allen test. On the left side she has biphasic Doppler signals in the radial and ulnar position with a normal Allen test.  I have independently interpreted her upper extremity vein mapping. On the left side, the  forearm cephalic vein is very small and the upper arm cephalic vein is not visualized. The basilic vein looks reasonable in size. On the right side the forearm cephalic vein is small, the upper arm cephalic vein is marginal, and the basilic vein is marginal.      Assessment/Plan:     Chronic renal disease, stage 4, severely decreased glomerular filtration rate between 15-29 mL/min/1.73 square meter Based on her vein map, most likely her only option for a fistula would be a basilic vein transposition on the left.  If this vein were marginal I would probably do only a first stage basilic vein transposition. In addition, I have spoken to Dr. Mercy Moore. He will review her records and let us know if she needs an AV graft if an AV fistula is not possible. I have explained the indications for placement of an AV fistula or AV graft. I've explained that if at all possible we will place an AV fistula.  I have reviewed the risks of placement of an AV fistula including but not limited to: failure of the fistula to mature, need for subsequent interventions, and thrombosis. In addition I have reviewed the potential complications of placement of an AV graft. These risks include, but are not limited to, graft thrombosis, graft infection, wound healing problems, bleeding, arm swelling, and steal syndrome. All the patient's questions were answered and they are agreeable to proceed with surgery. Her surgery is scheduled for 08/03/2014.    Angelia Mould MD [facility]

## 2014-08-03 NOTE — Interval H&P Note (Signed)
History and Physical Interval Note:  08/03/2014 7:22 AM  Susan Mccoy  has presented today for surgery, with the diagnosis of Stage IV chronic kidney disease N18.4  The various methods of treatment have been discussed with the patient and family. After consideration of risks, benefits and other options for treatment, the patient has consented to  Procedure(s): BASILIC VEIN TRANSPOSITION VERSUS ARTERIOVENOUS GRAFT INSERTION (Left) as a surgical intervention .  The patient's history has been reviewed, patient examined, no change in status, stable for surgery.  I have reviewed the patient's chart and labs.  Questions were answered to the patient's satisfaction.     Deaisha Welborn S

## 2014-08-03 NOTE — Op Note (Signed)
    NAME: Susan Mccoy   MRN: [id number] DOB: Oct 27, 1931    DATE OF OPERATION: 08/03/2014  PREOP DIAGNOSIS: Stage IV chronic kidney disease  POSTOP DIAGNOSIS: Same  PROCEDURE: Left upper arm AV graft (4-7 mm PTFE graft)  SURGEON: Judeth Cornfield. Scot Dock, MD, FACS  ASSIST: Silva Bandy, [facility]  ANESTHESIA: local with sedation   EBL: minimal  INDICATIONS: [name] is a 80 y.o. female who is not yet on dialysis. She presents for access.  FINDINGS: Her only potential option for a fistula was the upper arm basilic vein. However this was very small and sclerotic and was not adequate. In addition, she had a high bifurcation of her brachial artery. This reason an upper arm loop graft was placed.  TECHNIQUE: The patient was taken to the operating room and sedated by anesthesia. The left upper extremity was prepped and draped in usual sterile fashion. I looked at the veins with the duplex scanner and there were no adequate veins for a fistula. I therefore placed a graft. After the skin was anesthetized with 1% lidocaine a longitudinal incision was made just above the antecubital fossa. Here the veins were very small and were not adequate or outflow. In addition the arteries were very small and I interrogated with a Doppler. It was clear that the patient had a high bifurcation of the brachial artery. I therefore elected to place an upper arm loop graft.  A separate longitudinal incision was made beneath the axilla after the skin was anesthetized. Here the high brachial vein was dissected free and controlled with a vessel loop. The brachial artery was also controlled. A 4-7 mm PTFE graft was then tunneled in a loop fashion in the upper arm with the arterial aspect of the graft along the lateral aspect of the upper arm. The patient was heparinized. The brachial artery was clamped proximally and distally and a longitudinal arteriotomy was made. A short segment of the 4 mm and of the graft was excised,  the graft slightly spatulated, and sewn end-to-side to the brachial artery using continuous 6-0 Prolene suture. The graft isn't poorly appropriate length for anastomosis to the high brachial vein. The vein was ligated distally and spatulated proximate. The graft was cut the appropriate length, spatulated and sewn into into the vein using continuous 6-0 Prolene suture. At the completion was a good thrill in the fistula and a radial and ulnar signal with the Doppler. The heparin was partially reversed with protamine.  Each of the wounds was closed with deep layer of 3-0 Vicryl and the skin closed with 4-0 Vicryl. Dermabond was applied. The patient tolerated the procedure well and was transferred to the recovery room in stable condition. All needle and sponge counts were correct.  [name], MD, FACS [facility]  DATE OF DICTATION:   08/03/2014

## 2014-08-03 NOTE — Anesthesia Preprocedure Evaluation (Addendum)
Anesthesia Evaluation  Patient identified by MRN, date of birth, ID band Patient awake    Reviewed: Allergy & Precautions, H&P , NPO status , Patient's Chart, lab work & pertinent test results, reviewed documented beta blocker date and time   History of Anesthesia Complications Negative for: history of anesthetic complications  Airway Mallampati: III  TM Distance: >3 FB Neck ROM: Full  Mouth opening: Limited Mouth Opening  Dental  (+) Dental Advisory Given, Upper Dentures, Lower Dentures   Pulmonary shortness of breath and Long-Term Oxygen Therapy, former smoker,  Pulmonary fibrosis with 2L O2 continuous    + decreased breath sounds      Cardiovascular hypertension, Pt. on medications and Pt. on home beta blockers +CHF Rhythm:Regular     Neuro/Psych Seizures -,  PSYCHIATRIC DISORDERS Depression Bipolar Disorder TIA   GI/Hepatic GERD-  ,  Endo/Other  diabetes, Type 2, Oral Hypoglycemic Agents  Renal/GU Renal InsufficiencyRenal disease     Musculoskeletal  (+) Arthritis -,   Abdominal   Peds  Hematology   Anesthesia Other Findings   Reproductive/Obstetrics                            Anesthesia Physical Anesthesia Plan  ASA: III  Anesthesia Plan: MAC   Post-op Pain Management:    Induction: Intravenous  Airway Management Planned: Natural Airway and Simple Face Mask  Additional Equipment: None  Intra-op Plan:   Post-operative Plan:   Informed Consent: I have reviewed the patients History and Physical, chart, labs and discussed the procedure including the risks, benefits and alternatives for the proposed anesthesia with the patient or authorized representative who has indicated his/her understanding and acceptance.     Plan Discussed with: CRNA and Surgeon  Anesthesia Plan Comments:         Anesthesia Quick Evaluation

## 2014-08-07 ENCOUNTER — Encounter (HOSPITAL_COMMUNITY): Payer: Self-pay | Admitting: Vascular Surgery

## 2014-08-14 ENCOUNTER — Inpatient Hospital Stay (HOSPITAL_COMMUNITY)
Admission: EM | Admit: 2014-08-14 | Discharge: 2014-08-18 | DRG: 380 | Disposition: A | Discharge status: Skilled Nursing Facility | Payer: Medicare Other | Attending: Internal Medicine | Admitting: Internal Medicine

## 2014-08-14 ENCOUNTER — Emergency Department (HOSPITAL_COMMUNITY): Payer: Medicare Other

## 2014-08-14 ENCOUNTER — Encounter (HOSPITAL_COMMUNITY): Payer: Self-pay | Admitting: Emergency Medicine

## 2014-08-14 DIAGNOSIS — Z8711 Personal history of peptic ulcer disease: Secondary | ICD-10-CM

## 2014-08-14 DIAGNOSIS — E785 Hyperlipidemia, unspecified: Secondary | ICD-10-CM | POA: Diagnosis present

## 2014-08-14 DIAGNOSIS — K219 Gastro-esophageal reflux disease without esophagitis: Secondary | ICD-10-CM | POA: Diagnosis present

## 2014-08-14 DIAGNOSIS — I509 Heart failure, unspecified: Secondary | ICD-10-CM

## 2014-08-14 DIAGNOSIS — Z8673 Personal history of transient ischemic attack (TIA), and cerebral infarction without residual deficits: Secondary | ICD-10-CM | POA: Diagnosis not present

## 2014-08-14 DIAGNOSIS — Z87891 Personal history of nicotine dependence: Secondary | ICD-10-CM | POA: Diagnosis not present

## 2014-08-14 DIAGNOSIS — K5900 Constipation, unspecified: Secondary | ICD-10-CM | POA: Diagnosis present

## 2014-08-14 DIAGNOSIS — K208 Other esophagitis: Secondary | ICD-10-CM | POA: Diagnosis present

## 2014-08-14 DIAGNOSIS — R079 Chest pain, unspecified: Secondary | ICD-10-CM

## 2014-08-14 DIAGNOSIS — N184 Chronic kidney disease, stage 4 (severe): Secondary | ICD-10-CM | POA: Diagnosis present

## 2014-08-14 DIAGNOSIS — B9620 Unspecified Escherichia coli [E. coli] as the cause of diseases classified elsewhere: Secondary | ICD-10-CM | POA: Diagnosis present

## 2014-08-14 DIAGNOSIS — J690 Pneumonitis due to inhalation of food and vomit: Secondary | ICD-10-CM | POA: Diagnosis present

## 2014-08-14 DIAGNOSIS — K449 Diaphragmatic hernia without obstruction or gangrene: Secondary | ICD-10-CM | POA: Diagnosis present

## 2014-08-14 DIAGNOSIS — Q393 Congenital stenosis and stricture of esophagus: Secondary | ICD-10-CM | POA: Diagnosis not present

## 2014-08-14 DIAGNOSIS — D631 Anemia in chronic kidney disease: Secondary | ICD-10-CM | POA: Diagnosis present

## 2014-08-14 DIAGNOSIS — N390 Urinary tract infection, site not specified: Secondary | ICD-10-CM | POA: Diagnosis present

## 2014-08-14 DIAGNOSIS — I10 Essential (primary) hypertension: Secondary | ICD-10-CM | POA: Diagnosis present

## 2014-08-14 DIAGNOSIS — G5600 Carpal tunnel syndrome, unspecified upper limb: Secondary | ICD-10-CM | POA: Diagnosis present

## 2014-08-14 DIAGNOSIS — T50905A Adverse effect of unspecified drugs, medicaments and biological substances, initial encounter: Secondary | ICD-10-CM | POA: Diagnosis present

## 2014-08-14 DIAGNOSIS — I129 Hypertensive chronic kidney disease with stage 1 through stage 4 chronic kidney disease, or unspecified chronic kidney disease: Secondary | ICD-10-CM | POA: Diagnosis present

## 2014-08-14 DIAGNOSIS — K760 Fatty (change of) liver, not elsewhere classified: Secondary | ICD-10-CM | POA: Diagnosis present

## 2014-08-14 DIAGNOSIS — I5032 Chronic diastolic (congestive) heart failure: Secondary | ICD-10-CM | POA: Diagnosis present

## 2014-08-14 DIAGNOSIS — R1310 Dysphagia, unspecified: Secondary | ICD-10-CM

## 2014-08-14 DIAGNOSIS — K2210 Ulcer of esophagus without bleeding: Principal | ICD-10-CM | POA: Diagnosis present

## 2014-08-14 DIAGNOSIS — K222 Esophageal obstruction: Secondary | ICD-10-CM

## 2014-08-14 DIAGNOSIS — E11649 Type 2 diabetes mellitus with hypoglycemia without coma: Secondary | ICD-10-CM | POA: Diagnosis present

## 2014-08-14 DIAGNOSIS — E119 Type 2 diabetes mellitus without complications: Secondary | ICD-10-CM

## 2014-08-14 DIAGNOSIS — E7800 Pure hypercholesterolemia, unspecified: Secondary | ICD-10-CM | POA: Diagnosis present

## 2014-08-14 DIAGNOSIS — N189 Chronic kidney disease, unspecified: Secondary | ICD-10-CM

## 2014-08-14 LAB — URINALYSIS, ROUTINE W REFLEX MICROSCOPIC
BILIRUBIN URINE: NEGATIVE
Glucose, UA: NEGATIVE mg/dL
Hgb urine dipstick: NEGATIVE
Ketones, ur: NEGATIVE mg/dL
Nitrite: NEGATIVE
Protein, ur: NEGATIVE mg/dL
SPECIFIC GRAVITY, URINE: 1.011 (ref 1.005–1.030)
Urobilinogen, UA: 0.2 mg/dL (ref 0.0–1.0)
pH: 5 (ref 5.0–8.0)

## 2014-08-14 LAB — I-STAT CHEM 8, ED
BUN: 50 mg/dL — ABNORMAL HIGH (ref 6–23)
CHLORIDE: 99 meq/L (ref 96–112)
CREATININE: 2.3 mg/dL — AB (ref 0.50–1.10)
Calcium, Ion: 1.21 mmol/L (ref 1.13–1.30)
Glucose, Bld: 245 mg/dL — ABNORMAL HIGH (ref 70–99)
HCT: 37 % (ref 36.0–46.0)
HEMOGLOBIN: 12.6 g/dL (ref 12.0–15.0)
Potassium: 5.1 mmol/L (ref 3.5–5.1)
Sodium: 135 mmol/L (ref 135–145)
TCO2: 23 mmol/L (ref 0–100)

## 2014-08-14 LAB — CBC
HEMATOCRIT: 34.9 % — AB (ref 36.0–46.0)
Hemoglobin: 11.4 g/dL — ABNORMAL LOW (ref 12.0–15.0)
MCH: 32.6 pg (ref 26.0–34.0)
MCHC: 32.7 g/dL (ref 30.0–36.0)
MCV: 99.7 fL (ref 78.0–100.0)
Platelets: 257 10*3/uL (ref 150–400)
RBC: 3.5 MIL/uL — ABNORMAL LOW (ref 3.87–5.11)
RDW: 14.3 % (ref 11.5–15.5)
WBC: 19.5 10*3/uL — AB (ref 4.0–10.5)

## 2014-08-14 LAB — GLUCOSE, CAPILLARY: GLUCOSE-CAPILLARY: 130 mg/dL — AB (ref 70–99)

## 2014-08-14 LAB — PROTIME-INR
INR: 1.14 (ref 0.00–1.49)
PROTHROMBIN TIME: 14.7 s (ref 11.6–15.2)

## 2014-08-14 LAB — BRAIN NATRIURETIC PEPTIDE: B Natriuretic Peptide: 192.1 pg/mL — ABNORMAL HIGH (ref 0.0–100.0)

## 2014-08-14 LAB — I-STAT CG4 LACTIC ACID, ED: LACTIC ACID, VENOUS: 0.66 mmol/L (ref 0.5–2.2)

## 2014-08-14 LAB — BASIC METABOLIC PANEL
Anion gap: 5 (ref 5–15)
BUN: 52 mg/dL — ABNORMAL HIGH (ref 6–23)
CO2: 25 mmol/L (ref 19–32)
CREATININE: 2.6 mg/dL — AB (ref 0.50–1.10)
Calcium: 8.9 mg/dL (ref 8.4–10.5)
Chloride: 101 mEq/L (ref 96–112)
GFR calc Af Amer: 19 mL/min — ABNORMAL LOW (ref >90)
GFR, EST NON AFRICAN AMERICAN: 16 mL/min — AB (ref >90)
Glucose, Bld: 239 mg/dL — ABNORMAL HIGH (ref 70–99)
Potassium: 4.9 mmol/L (ref 3.5–5.1)
Sodium: 131 mmol/L — ABNORMAL LOW (ref 135–145)

## 2014-08-14 LAB — URINE MICROSCOPIC-ADD ON

## 2014-08-14 LAB — APTT: aPTT: 28 seconds (ref 24–37)

## 2014-08-14 LAB — I-STAT TROPONIN, ED: Troponin i, poc: 0 ng/mL (ref 0.00–0.08)

## 2014-08-14 MED ORDER — ONDANSETRON HCL 4 MG/2ML IJ SOLN
4.0000 mg | Freq: Once | INTRAMUSCULAR | Status: AC
  Administered 2014-08-14: 4 mg via INTRAVENOUS
  Filled 2014-08-14: qty 2

## 2014-08-14 MED ORDER — HEPARIN SODIUM (PORCINE) 5000 UNIT/ML IJ SOLN
5000.0000 [IU] | Freq: Three times a day (TID) | INTRAMUSCULAR | Status: DC
  Administered 2014-08-14 – 2014-08-18 (×10): 5000 [IU] via SUBCUTANEOUS
  Filled 2014-08-14 (×13): qty 1

## 2014-08-14 MED ORDER — GLUCAGON HCL RDNA (DIAGNOSTIC) 1 MG IJ SOLR
0.5000 mg | Freq: Once | INTRAMUSCULAR | Status: AC | PRN
  Administered 2014-08-14: 0.5 mg via INTRAVENOUS
  Filled 2014-08-14: qty 1

## 2014-08-14 MED ORDER — CALCITRIOL 0.25 MCG PO CAPS
0.2500 ug | ORAL_CAPSULE | ORAL | Status: DC
  Administered 2014-08-17: 0.25 ug via ORAL
  Filled 2014-08-14 (×2): qty 1

## 2014-08-14 MED ORDER — OCUVITE PO TABS
1.0000 | ORAL_TABLET | Freq: Every day | ORAL | Status: DC
  Administered 2014-08-16 – 2014-08-18 (×3): 1 via ORAL
  Filled 2014-08-14 (×4): qty 1

## 2014-08-14 MED ORDER — ACETAMINOPHEN 500 MG PO TABS
1000.0000 mg | ORAL_TABLET | Freq: Three times a day (TID) | ORAL | Status: DC | PRN
Start: 2014-08-14 — End: 2014-08-16

## 2014-08-14 MED ORDER — SODIUM CHLORIDE 0.9 % IV SOLN
INTRAVENOUS | Status: AC
  Administered 2014-08-14: 23:00:00 via INTRAVENOUS

## 2014-08-14 MED ORDER — DOCUSATE SODIUM 100 MG PO CAPS
100.0000 mg | ORAL_CAPSULE | Freq: Two times a day (BID) | ORAL | Status: DC
Start: 2014-08-14 — End: 2014-08-18
  Administered 2014-08-16 – 2014-08-18 (×4): 100 mg via ORAL
  Filled 2014-08-14 (×9): qty 1

## 2014-08-14 MED ORDER — FENTANYL CITRATE 0.05 MG/ML IJ SOLN
50.0000 ug | Freq: Once | INTRAMUSCULAR | Status: AC
  Administered 2014-08-14: 50 ug via INTRAVENOUS
  Filled 2014-08-14: qty 2

## 2014-08-14 MED ORDER — VANCOMYCIN HCL IN DEXTROSE 750-5 MG/150ML-% IV SOLN: 750.0000 mg | INTRAVENOUS | Status: DC

## 2014-08-14 MED ORDER — ALLOPURINOL 150 MG HALF TABLET
150.0000 mg | ORAL_TABLET | Freq: Every day | ORAL | Status: DC
  Administered 2014-08-16 – 2014-08-18 (×3): 150 mg via ORAL
  Filled 2014-08-14 (×4): qty 1

## 2014-08-14 MED ORDER — DEXTROSE 5 % IV SOLN
500.0000 mg | INTRAVENOUS | Status: DC
  Administered 2014-08-14: 500 mg via INTRAVENOUS
  Filled 2014-08-14: qty 0.5

## 2014-08-14 MED ORDER — PRIMIDONE 50 MG PO TABS
50.0000 mg | ORAL_TABLET | Freq: Two times a day (BID) | ORAL | Status: DC
  Administered 2014-08-15 – 2014-08-18 (×6): 50 mg via ORAL
  Filled 2014-08-14 (×9): qty 1

## 2014-08-14 MED ORDER — SODIUM CHLORIDE 0.9 % IV SOLN
250.0000 mg | Freq: Three times a day (TID) | INTRAVENOUS | Status: DC
  Administered 2014-08-14 – 2014-08-15 (×2): 250 mg via INTRAVENOUS
  Filled 2014-08-14 (×4): qty 250

## 2014-08-14 MED ORDER — CYCLOBENZAPRINE HCL 5 MG PO TABS
5.0000 mg | ORAL_TABLET | Freq: Every day | ORAL | Status: DC
Start: 2014-08-14 — End: 2014-08-18
  Administered 2014-08-15 – 2014-08-17 (×3): 5 mg via ORAL
  Filled 2014-08-14 (×5): qty 1

## 2014-08-14 MED ORDER — INSULIN ASPART 100 UNIT/ML ~~LOC~~ SOLN
0.0000 [IU] | Freq: Three times a day (TID) | SUBCUTANEOUS | Status: DC
  Administered 2014-08-16 – 2014-08-18 (×3): 2 [IU] via SUBCUTANEOUS

## 2014-08-14 MED ORDER — HYDROCODONE-ACETAMINOPHEN 5-325 MG PO TABS: 1.0000 | ORAL_TABLET | Freq: Four times a day (QID) | ORAL | Status: DC | PRN

## 2014-08-14 MED ORDER — ASPIRIN EC 81 MG PO TBEC
81.0000 mg | DELAYED_RELEASE_TABLET | Freq: Every day | ORAL | Status: DC
  Administered 2014-08-15 – 2014-08-18 (×4): 81 mg via ORAL
  Filled 2014-08-14 (×4): qty 1

## 2014-08-14 MED ORDER — VANCOMYCIN HCL 10 G IV SOLR
1250.0000 mg | Freq: Once | INTRAVENOUS | Status: AC
  Administered 2014-08-14: 1250 mg via INTRAVENOUS
  Filled 2014-08-14: qty 1250

## 2014-08-14 MED ORDER — FERROUS SULFATE 325 (65 FE) MG PO TABS
325.0000 mg | ORAL_TABLET | Freq: Every day | ORAL | Status: DC
  Administered 2014-08-17: 325 mg via ORAL
  Filled 2014-08-14 (×5): qty 1

## 2014-08-14 MED ORDER — VANCOMYCIN HCL IN DEXTROSE 1-5 GM/200ML-% IV SOLN: 1000.0000 mg | Freq: Once | INTRAVENOUS | Status: DC

## 2014-08-14 MED ORDER — PANTOPRAZOLE SODIUM 20 MG PO TBEC
20.0000 mg | DELAYED_RELEASE_TABLET | Freq: Every day | ORAL | Status: DC
  Administered 2014-08-16 – 2014-08-18 (×3): 20 mg via ORAL
  Filled 2014-08-14 (×4): qty 1

## 2014-08-14 MED ORDER — ATENOLOL 25 MG PO TABS
25.0000 mg | ORAL_TABLET | Freq: Every day | ORAL | Status: DC
  Administered 2014-08-15 – 2014-08-18 (×4): 25 mg via ORAL
  Filled 2014-08-14 (×4): qty 1

## 2014-08-14 MED ORDER — SIMVASTATIN 20 MG PO TABS
20.0000 mg | ORAL_TABLET | Freq: Every day | ORAL | Status: DC
Start: 2014-08-14 — End: 2014-08-18
  Administered 2014-08-15 – 2014-08-17 (×3): 20 mg via ORAL
  Filled 2014-08-14 (×5): qty 1

## 2014-08-14 MED ORDER — MORPHINE SULFATE 4 MG/ML IJ SOLN
4.0000 mg | Freq: Once | INTRAMUSCULAR | Status: AC
  Administered 2014-08-14: 4 mg via INTRAVENOUS
  Filled 2014-08-14: qty 1

## 2014-08-14 MED ORDER — HYDROXYZINE HCL 25 MG PO TABS
25.0000 mg | ORAL_TABLET | Freq: Two times a day (BID) | ORAL | Status: DC | PRN
  Filled 2014-08-14: qty 1

## 2014-08-14 MED ORDER — VITAMIN D3 25 MCG (1000 UNIT) PO TABS
1000.0000 [IU] | ORAL_TABLET | Freq: Every day | ORAL | Status: DC
  Administered 2014-08-16 – 2014-08-18 (×3): 1000 [IU] via ORAL
  Filled 2014-08-14 (×4): qty 1

## 2014-08-14 NOTE — ED Notes (Signed)
Pt transported to radiology.

## 2014-08-14 NOTE — ED Notes (Signed)
Per ems-- pt reports burning and "lump" in chest- non radiating x 2 days. Denies sob,nv. Pt takes medication for heart burn but hasn't taken anything today d/t not being able to swallow anything. Hx having esophagus stretched. 12 lead unremarkable.

## 2014-08-14 NOTE — H&P (Signed)
[facility] History and Physical  Susan Mccoy OHF:[id number] DOB: 1932/04/18 DOA: 08/14/2014  Referring physician: ED physician PCP: Henrine Screws, MD  Specialists:   Chief Complaint: Difficulty swallowing  HPI: Susan Mccoy is a 81 y.o. female with past medical history of esophageal stricture (s/p of dilation 5 years ago), diabetes mellitus, congestive heart failure, seizures, hypertension, hyperlipidemia, GERD, history of TIA, history of chronic kidney disease-stage IV, anemia, diverticulosis, history of lower GI bleeding, who presented with difficulty swallowing.  Patient reports that in the past 2 days, she has difficulty swallowing. She feels like the food material  stuck in the mid of esophagus. She has some pain in the mid of her chest, which she thinks it is because of esophageal stricture based on her past experience. She said the difficulty swallowing is mainly related to the solid food, not to the liquid food. She dose not have recent weight loss.  She reports having mild cough, no sputum production, no fever or chills. Patient does not have nausea or vomiting. She reports that she had one episode of vomiting when she tried to clear up her esophagus yesterday.  Patient has left upper arm AV graft (4-7 mm PTFE graft) placement on 08/03/14. She still had mild tenderness over her left arm. She denies fever, chills, headaches, cough, abdominal pain, diarrhea, dysuria, urgency, frequency, hematuria, skin rashes, or leg swelling.  Work up in the ED demonstrates leukocytosis with WBC 19.5. Negative troponin. Urinalysis is positive for leukocytes, but negative for nitrite. Chest x-ray showed densities in the left lower chest that obscure the left hemidiaphragm, concerning for pneumonia or aspiration per radiologist.  Review of Systems: As presented in the history of presenting illness, rest negative.  Where does patient live?  At home  Can patient participate in ADLs? A  little  Allergy:  Allergies  Allergen Reactions  . Morphine And Related Other (See Comments)    Very sensitive to all pain meds-narcotic   . Penicillins Other (See Comments)    REACTION: GI Upset  . Sulfonamide Derivatives Other (See Comments)    Unknown. Symptoms were when she was a teenager  . Celebrex [Celecoxib] Rash  . Valium [Diazepam] Other (See Comments)    Past Medical History  Diagnosis Date  . Hypertension   . Thyroid disease   . Abnormal LFTs     Fatty liver by [place]  . Hypercholesterolemia   . Carpal tunnel syndrome   . DJD (degenerative joint disease)   . Shingles   . Meralgia paraesthetica   . GERD (gastroesophageal reflux disease)   . Pulmonary fibrosis   . Hemorrhoids   . Diverticulosis   . Normocytic normochromic anemia   . History of recurrent TIAs   . UTI (lower urinary tract infection)   . Chronic renal disease, stage 4, severely decreased glomerular filtration rate between 15-29 mL/min/1.73 square meter   . Anemia of chronic kidney failure   . Diabetes mellitus without complication     Type 2  . CHF (congestive heart failure)     pt not aware of this  . Bipolar disorder     in the past  . Constipation   . Macular degeneration   . Seizures     questionably had when she had TIA's, takes Primodone, states it has helped with the "shaking"  . Complication of anesthesia     trouble with putting her to sleep - 12/15 - pt doesn't remember this    Past Surgical History  Procedure Laterality Date  . Back surgery      X 2 - lumbar  . Tonsillectomy    . Cataract extraction, bilateral    . Breast biopsy  B/L- said to be negative for malignancy.  . Colonoscopy    . Colonoscopy N/A 08/08/2013    Procedure: COLONOSCOPY;  Surgeon: [name], MD;  Location: [facility];  Service: Endoscopy;  Laterality: N/A;  EGD is possible  . Carpal tunnel release Left   . Eye surgery Bilateral     cataract surgery with lens implant  . Bascilic vein transposition  Left 08/03/2014    Procedure:  INSERTION of Left Arm Gortex graft;  Surgeon: Angelia Mould, MD;  Location: [facility];  Service: Vascular;  Laterality: Left;    Social History:  reports that she quit smoking about 23 years ago. She has never used smokeless tobacco. She reports that she does not drink alcohol or use illicit drugs.  Family History:  Family History  Problem Relation Age of Onset  . Emphysema Father     smoker  . Diabetes Father   . Varicose Veins Father   . Heart disease Father     before age 29  . Heart attack Father   . Emphysema Brother     smoker  . Cancer Brother   . Varicose Veins Mother   . Cancer Sister   . Diabetes Sister   . Cancer Sister   . Cancer Brother      Prior to Admission medications   Medication Sig Start Date End Date Taking? Authorizing Provider  acetaminophen (TYLENOL) 500 MG tablet Take 1,000 mg by mouth every 6 (six) hours as needed.   Yes Historical Provider, MD  allopurinol (ZYLOPRIM) 300 MG tablet Take 150 mg by mouth daily.   Yes Historical Provider, MD  aspirin EC 81 MG tablet Take 81 mg by mouth daily.   Yes Historical Provider, MD  atenolol (TENORMIN) 25 MG tablet Take 25 mg by mouth daily.    Yes Historical Provider, MD  beta carotene w/minerals (OCUVITE) tablet Take 1 tablet by mouth daily.   Yes Historical Provider, MD  cholecalciferol (VITAMIN D) 1000 UNITS tablet Take 1,000 Units by mouth daily.   Yes Historical Provider, MD  Cyanocobalamin (VITAMIN B-12 IJ) Inject 1 Syringe as directed every 30 (thirty) days. As directed. Due 08/19/2013   Yes Historical Provider, MD  cyclobenzaprine (FLEXERIL) 10 MG tablet Take 5 mg by mouth at bedtime.   Yes Historical Provider, MD  ferrous sulfate 325 (65 FE) MG tablet Take 325 mg by mouth daily with breakfast.   Yes Historical Provider, MD  glipiZIDE (GLUCOTROL XL) 10 MG 24 hr tablet Take 10 mg by mouth daily.   Yes Historical Provider, MD  HYDROcodone-acetaminophen (NORCO) 5-325 MG per  tablet Take 1 tablet by mouth every 6 (six) hours as needed for moderate pain. 08/03/14  Yes Alvia Grove, PA-C  hydrOXYzine (ATARAX/VISTARIL) 25 MG tablet Take 25 mg by mouth 2 (two) times daily as needed for itching.    Yes Historical Provider, MD  lansoprazole (PREVACID) 30 MG capsule Take 30 mg by mouth daily. Before a meal once a day   Yes Historical Provider, MD  primidone (MYSOLINE) 50 MG tablet Take 50 mg by mouth 2 (two) times daily.   Yes Historical Provider, MD  primidone (MYSOLINE) 50 MG tablet Take 50 mg by mouth 2 (two) times daily.   Yes Historical Provider, MD  simvastatin (ZOCOR) 20 MG  tablet Take 20 mg by mouth at bedtime.  03/01/14  Yes Historical Provider, MD  calcitRIOL (ROCALTROL) 0.25 MCG capsule Take 0.25 mcg by mouth every other day.    Historical Provider, MD  TORSEMIDE PO Take 40 mg by mouth daily.     Historical Provider, MD    Physical Exam: Filed Vitals:   08/14/14 1900 08/14/14 2000 08/14/14 2025 08/14/14 2031  BP: 156/97 156/56  172/63  Pulse: 76 74  75  Temp:    97.5 F (36.4 C)  TempSrc:    Oral  Resp: 11 20  18   Height:    5\' 1"  (1.549 m)  Weight:    57.2 kg (126 lb 1.7 oz)  SpO2: 100% 99% 99% 100%   General: Not in acute distress HEENT:       Eyes: PERRL, EOMI, no scleral icterus       ENT: No discharge from the ears and nose, no pharynx injection, no tonsillar enlargement.        Neck: No JVD, no bruit, no mass felt. Cardiac: S1/S2, RRR, No murmurs, No gallops or rubs Pulm: has fine crackles bilaterally and posteriorly, No rhonchi or rubs. Abd: Soft, nondistended, nontender, no rebound pain, no organomegaly, BS present Ext: No edema bilaterally. 2+DP/PT pulse bilaterally Musculoskeletal: No joint deformities, erythema, or stiffness, ROM full Skin: No rashes.  Neuro: Alert and oriented X3, cranial nerves II-XII grossly intact, muscle strength 5/5 in all extremeties, sensation to light touch intact. Brachial reflex 2+ bilaterally. Knee reflex 1+  bilaterally. Psych: Patient is not psychotic, no suicidal or hemocidal ideation.  Labs on Admission:  Basic Metabolic Panel:  Recent Labs Lab 08/14/14 1547 08/14/14 1751  NA 131* 135  K 4.9 5.1  CL 101 99  CO2 25  --   GLUCOSE 239* 245*  BUN 52* 50*  CREATININE 2.60* 2.30*  CALCIUM 8.9  --    Liver Function Tests: No results for input(s): AST, ALT, ALKPHOS, BILITOT, PROT, ALBUMIN in the last 168 hours. No results for input(s): LIPASE, AMYLASE in the last 168 hours. No results for input(s): AMMONIA in the last 168 hours. CBC:  Recent Labs Lab 08/14/14 1547 08/14/14 1751  WBC 19.5*  --   HGB 11.4* 12.6  HCT 34.9* 37.0  MCV 99.7  --   PLT 257  --    Cardiac Enzymes: No results for input(s): CKTOTAL, CKMB, CKMBINDEX, TROPONINI in the last 168 hours.  BNP (last 3 results) No results for input(s): PROBNP in the last 8760 hours. CBG: No results for input(s): GLUCAP in the last 168 hours.  Radiological Exams on Admission: Dg Chest 2 View  08/14/2014   CLINICAL DATA:  Tightness in the esophagus with chest pain.  EXAM: CHEST  2 VIEW  COMPARISON:  08/07/2013  FINDINGS: There are patchy interstitial lung markings throughout both lungs. The left hemidiaphragm is obscured and there is concern for airspace disease in the left lower lobe. Cannot exclude fullness in the left hilar region. The aortic arch is heavily calcified. No large pleural effusions based on the lateral view. Difficult to assess the cardiac size.  IMPRESSION: Densities in the left lower chest that obscure the left hemidiaphragm. Findings are concerning for pneumonia or aspiration. Difficult to exclude fullness in the left hilum. Recommend follow-up to ensure resolution.  Patchy interstitial densities in both lungs may represent a combination of chronic changes and low lung volumes.   Electronically Signed   By: Markus Daft M.D.   On: 08/14/2014  17:21    EKG: Independently reviewed.   Assessment/Plan Principal  Problem:   Difficulty swallowing Active Problems:   Diabetes mellitus without complication   HYPERCHOLESTEROLEMIA   Anemia of chronic kidney failure   Hypertension   GERD (gastroesophageal reflux disease)   CHF (congestive heart failure)   Chronic renal disease, stage 4, severely decreased glomerular filtration rate between 15-29 mL/min/1.73 square meter   Aspiration pneumonia   History of transient ischemic attack (TIA)  Difficulty swallowing: It is most likely due to esophageal stricture given her past history of esophageal stenosis. She is s/p of dilation of esophagus. No alarming symptoms, such as weight loss. ED consulted GI, Dr. Jules Husbands suggested to put patient on NPO for possible endoscopy tomorrow.  - admit to tele bed given hx of CHF - NPO - tests wallow bedside per nurse - gentle IVD; NS 50cc/h - Protonix - trop x 3   Aspiration pneumonia: She has leukocytosis with WBC 19.5. Patient's chest x-ray showing possible pneumonia. It is most likely due to aspiration pneumonia given her current difficulty swallowing. -Start vancomycin and Primaxin IV -Follow-up Blood culture, Legionella antigen, strep testing, sputum culture  Diabetes mellitus: Last A1c was 6.4 on 08/07/13. Patient is on glipizide at home. -Switched to oral medication to sliding scale insulin  Hyperlipidemia: LDL was 154 on 12/01/09. Patient is on Zocor at home -Continue Zocor -Check fasting lipid panel tomorrow morning  Hypertension -Continue home medications: Atenolol  Diastolic Congestive heart failure: 2-D echo on 08/08/13 showed EF 65 to 70%. Patient is on torsemide at home. She is euvolemic on admission. -Hold torsemide -Gentle IV fluid as above -Check BNP morning  CKD-IV: s/p of AV graft. Baseline creatinine 1.7-2.6. Her creatinine is 2.3 on admission, which is close to baseline. -Follow-up renal function by BMP  Constipation: -Colace  History of TIA: No acute issues  -continue home medications:  Aspirin and Zocor.  History of seizure: Patient did not have seizure in the past 2 years. Currently patient is on Primidone at home. -Continue home medications.   DVT ppx: SQ Heparin     Code Status: Full code Family Communication: Yes, patient's   daughter    at bed side Disposition Plan: Admit to inpatient   Date of Service 08/14/2014    Ivor Costa [facility] Pager [phone]  If 7PM-7AM, please contact night-coverage [email or web address] Password [facility] 08/14/2014, 9:27 PM

## 2014-08-14 NOTE — Progress Notes (Addendum)
ANTIBIOTIC CONSULT NOTE - INITIAL  Pharmacy Consult for Primaxin and Vancomycin Indication: rule out pneumonia  Allergies  Allergen Reactions  . Penicillins     REACTION: GI Upset  . Sulfonamide Derivatives     Unknown. Symptoms were when she was a teenager  . Celebrex [Celecoxib] Rash    Patient Measurements:   Adjusted Body Weight:   Vital Signs: Temp: 97.6 F (36.4 C) (01/11 1543) Temp Source: Oral (01/11 1543) BP: 161/55 mmHg (01/11 1717) Pulse Rate: 78 (01/11 1735) Intake/Output from previous day:   Intake/Output from this shift:    Labs:  Recent Labs  08/14/14 1547 08/14/14 1751  WBC 19.5*  --   HGB 11.4* 12.6  PLT 257  --   CREATININE 2.60* 2.30*   Estimated Creatinine Clearance: 15.4 mL/min (by C-G formula based on Cr of 2.3). No results for input(s): VANCOTROUGH, VANCOPEAK, VANCORANDOM, GENTTROUGH, GENTPEAK, GENTRANDOM, TOBRATROUGH, TOBRAPEAK, TOBRARND, AMIKACINPEAK, AMIKACINTROU, AMIKACIN in the last 72 hours.   Microbiology: Recent Results (from the past 720 hour(s))  Surgical pcr screen     Status: None   Collection Time: 08/03/14  6:39 AM  Result Value Ref Range Status   MRSA, PCR NEGATIVE NEGATIVE Final   Staphylococcus aureus NEGATIVE NEGATIVE Final    Comment:        The Xpert SA Assay (FDA approved for NASAL specimens in patients over 26 years of age), is one component of a comprehensive surveillance program.  Test performance has been validated by [employer] for patients greater than or equal to 44 year old. It is not intended to diagnose infection nor to guide or monitor treatment.     Medical History: Past Medical History  Diagnosis Date  . Hypertension   . Thyroid disease   . Abnormal LFTs     Fatty liver by [place]  . Hypercholesterolemia   . Carpal tunnel syndrome   . DJD (degenerative joint disease)   . Shingles   . Meralgia paraesthetica   . GERD (gastroesophageal reflux disease)   . Pulmonary fibrosis   .  Hemorrhoids   . Diverticulosis   . Normocytic normochromic anemia   . History of recurrent TIAs   . UTI (lower urinary tract infection)   . Chronic renal disease, stage 4, severely decreased glomerular filtration rate between 15-29 mL/min/1.73 square meter   . Anemia of chronic kidney failure   . Diabetes mellitus without complication     Type 2  . CHF (congestive heart failure)     pt not aware of this  . Bipolar disorder     in the past  . Constipation   . Macular degeneration   . Seizures     questionably had when she had TIA's, takes Primodone, states it has helped with the "shaking"  . Complication of anesthesia     trouble with putting her to sleep - 12/15 - pt doesn't remember this    Medications:   (Not in a hospital admission) Scheduled:   Infusions:  . vancomycin     Assessment: 81yo female with ESRD not on dialysis (has fistula) presents with chest pain. Pharmacy is consulted to dose vancomycin and cefepime for rule out HCAP. Pt is afebrile, WBC 19.5, sCr 2.3 with CrCl ~ 74mL/min.  Goal of Therapy:  Vancomycin trough level 15-20 mcg/ml  Plan:  Vancomycin 1250mg  IV load followed by 750mg  IV q48h Cefepime 500mg  IV q24h Measure antibiotic drug levels at steady state Follow up culture results, renal function, and clinical course  Andrey Cota. Diona Foley, PharmD Clinical Pharmacist Pager [phone] 08/14/2014,6:12 PM  ADDN:  Pharmacy is consulted to dose Primaxin for aspiration pneumonia in place of cefepime.  Plan:  Primaxin 250mg  IV q8h Stop cefepime Watch renal function closely  Andrey Cota. Diona Foley, PharmD Clinical Pharmacist Pager [phone]

## 2014-08-14 NOTE — ED Provider Notes (Signed)
CSN: [id number]     Arrival date & time 08/14/14  1532 History   First MD Initiated Contact with Patient 08/14/14 1543     Chief Complaint  Patient presents with  . Chest Pain     (Consider location/radiation/quality/duration/timing/severity/associated sxs/prior Treatment) The history is provided by the patient.  NIDA MANFREDI is a 81 y.o. female hx of HTN, GERD, CHF, ESRD (not on dialysis but has fistula), pulmonary fibrosis, esophageal strictures here with chest pain. She feels like something stuck in her lower esophagus for the last 2 days. She is able to keep down her pills and some fluid but was unable to eat. Denies any fevers or chills or cough. Denies any abdominal pain.    Past Medical History  Diagnosis Date  . Hypertension   . Thyroid disease   . Abnormal LFTs     Fatty liver by [place]  . Hypercholesterolemia   . Carpal tunnel syndrome   . DJD (degenerative joint disease)   . Shingles   . Meralgia paraesthetica   . GERD (gastroesophageal reflux disease)   . Pulmonary fibrosis   . Hemorrhoids   . Diverticulosis   . Normocytic normochromic anemia   . History of recurrent TIAs   . UTI (lower urinary tract infection)   . Chronic renal disease, stage 4, severely decreased glomerular filtration rate between 15-29 mL/min/1.73 square meter   . Anemia of chronic kidney failure   . Diabetes mellitus without complication     Type 2  . CHF (congestive heart failure)     pt not aware of this  . Bipolar disorder     in the past  . Constipation   . Macular degeneration   . Seizures     questionably had when she had TIA's, takes Primodone, states it has helped with the "shaking"  . Complication of anesthesia     trouble with putting her to sleep - 12/15 - pt doesn't remember this   Past Surgical History  Procedure Laterality Date  . Back surgery      X 2 - lumbar  . Tonsillectomy    . Cataract extraction, bilateral    . Breast biopsy  B/L- said to be negative for  malignancy.  . Colonoscopy    . Colonoscopy N/A 08/08/2013    Procedure: COLONOSCOPY;  Surgeon: [name], MD;  Location: [facility];  Service: Endoscopy;  Laterality: N/A;  EGD is possible  . Carpal tunnel release Left   . Eye surgery Bilateral     cataract surgery with lens implant  . Bascilic vein transposition Left 08/03/2014    Procedure:  INSERTION of Left Arm Gortex graft;  Surgeon: Angelia Mould, MD;  Location: [facility];  Service: Vascular;  Laterality: Left;   Family History  Problem Relation Age of Onset  . Emphysema Father     smoker  . Diabetes Father   . Varicose Veins Father   . Heart disease Father     before age 67  . Heart attack Father   . Emphysema Brother     smoker  . Cancer Brother   . Varicose Veins Mother   . Cancer Sister   . Diabetes Sister   . Cancer Sister   . Cancer Brother    History  Substance Use Topics  . Smoking status: Former Smoker -- 2.00 packs/day for 40 years    Quit date: 08/05/1991  . Smokeless tobacco: Never Used  . Alcohol Use: No  OB History    No data available     Review of Systems  Cardiovascular: Positive for chest pain.  All other systems reviewed and are negative.     Allergies  Morphine and related; Penicillins; Sulfonamide derivatives; Valium; and Celebrex  Home Medications   Prior to Admission medications   Medication Sig Start Date End Date Taking? Authorizing Provider  acetaminophen (TYLENOL) 500 MG tablet Take 1,000 mg by mouth every 6 (six) hours as needed.   Yes Historical Provider, MD  allopurinol (ZYLOPRIM) 300 MG tablet Take 150 mg by mouth daily.   Yes Historical Provider, MD  aspirin EC 81 MG tablet Take 81 mg by mouth daily.   Yes Historical Provider, MD  atenolol (TENORMIN) 25 MG tablet Take 25 mg by mouth daily.    Yes Historical Provider, MD  beta carotene w/minerals (OCUVITE) tablet Take 1 tablet by mouth daily.   Yes Historical Provider, MD  cholecalciferol (VITAMIN D) 1000 UNITS  tablet Take 1,000 Units by mouth daily.   Yes Historical Provider, MD  Cyanocobalamin (VITAMIN B-12 IJ) Inject 1 Syringe as directed every 30 (thirty) days. As directed. Due 08/19/2013   Yes Historical Provider, MD  cyclobenzaprine (FLEXERIL) 10 MG tablet Take 5 mg by mouth at bedtime.   Yes Historical Provider, MD  ferrous sulfate 325 (65 FE) MG tablet Take 325 mg by mouth daily with breakfast.   Yes Historical Provider, MD  glipiZIDE (GLUCOTROL XL) 10 MG 24 hr tablet Take 10 mg by mouth daily.   Yes Historical Provider, MD  HYDROcodone-acetaminophen (NORCO) 5-325 MG per tablet Take 1 tablet by mouth every 6 (six) hours as needed for moderate pain. 08/03/14  Yes Alvia Grove, PA-C  hydrOXYzine (ATARAX/VISTARIL) 25 MG tablet Take 25 mg by mouth 2 (two) times daily as needed for itching.    Yes Historical Provider, MD  lansoprazole (PREVACID) 30 MG capsule Take 30 mg by mouth daily. Before a meal once a day   Yes Historical Provider, MD  primidone (MYSOLINE) 50 MG tablet Take 50 mg by mouth 2 (two) times daily.   Yes Historical Provider, MD  primidone (MYSOLINE) 50 MG tablet Take 50 mg by mouth 2 (two) times daily.   Yes Historical Provider, MD  simvastatin (ZOCOR) 20 MG tablet Take 20 mg by mouth at bedtime.  03/01/14  Yes Historical Provider, MD  calcitRIOL (ROCALTROL) 0.25 MCG capsule Take 0.25 mcg by mouth every other day.    Historical Provider, MD  TORSEMIDE PO Take 40 mg by mouth daily.     Historical Provider, MD   BP 156/97 mmHg  Pulse 76  Temp(Src) 97.6 F (36.4 C) (Oral)  Resp 11  SpO2 100% Physical Exam  Constitutional: She is oriented to person, place, and time.  Chronically ill, uncomfortable   HENT:  Head: Normocephalic.  Mouth/Throat: Oropharynx is clear and moist.  Eyes: Conjunctivae and EOM are normal. Pupils are equal, round, and reactive to light.  Neck: Normal range of motion. Neck supple.  Cardiovascular: Normal rate, regular rhythm and normal heart sounds.    Pulmonary/Chest:  Crackles bilateral bases, no wheezing   Abdominal: Soft. Bowel sounds are normal. She exhibits no distension. There is no tenderness. There is no rebound.  Musculoskeletal: Normal range of motion. She exhibits no edema or tenderness.  Neurological: She is alert and oriented to person, place, and time. No cranial nerve deficit. Coordination normal.  Skin: Skin is warm and dry.  Psychiatric: She has a normal mood and  affect. Her behavior is normal. Judgment and thought content normal.  Nursing note and vitals reviewed.   ED Course  Procedures (including critical care time) Labs Review Labs Reviewed  CBC - Abnormal; Notable for the following:    WBC 19.5 (*)    RBC 3.50 (*)    Hemoglobin 11.4 (*)    HCT 34.9 (*)    All other components within normal limits  BASIC METABOLIC PANEL - Abnormal; Notable for the following:    Sodium 131 (*)    Glucose, Bld 239 (*)    BUN 52 (*)    Creatinine, Ser 2.60 (*)    GFR calc non Af Amer 16 (*)    GFR calc Af Amer 19 (*)    All other components within normal limits  URINALYSIS, ROUTINE W REFLEX MICROSCOPIC - Abnormal; Notable for the following:    APPearance CLOUDY (*)    Leukocytes, UA MODERATE (*)    All other components within normal limits  URINE MICROSCOPIC-ADD ON - Abnormal; Notable for the following:    Bacteria, UA MANY (*)    All other components within normal limits  I-STAT CHEM 8, ED - Abnormal; Notable for the following:    BUN 50 (*)    Creatinine, Ser 2.30 (*)    Glucose, Bld 245 (*)    All other components within normal limits  CULTURE, BLOOD (ROUTINE X 2)  CULTURE, BLOOD (ROUTINE X 2)  URINE CULTURE  BRAIN NATRIURETIC PEPTIDE  I-STAT TROPOININ, ED  I-STAT CG4 LACTIC ACID, ED    Imaging Review Dg Chest 2 View  08/14/2014   CLINICAL DATA:  Tightness in the esophagus with chest pain.  EXAM: CHEST  2 VIEW  COMPARISON:  08/07/2013  FINDINGS: There are patchy interstitial lung markings throughout both  lungs. The left hemidiaphragm is obscured and there is concern for airspace disease in the left lower lobe. Cannot exclude fullness in the left hilar region. The aortic arch is heavily calcified. No large pleural effusions based on the lateral view. Difficult to assess the cardiac size.  IMPRESSION: Densities in the left lower chest that obscure the left hemidiaphragm. Findings are concerning for pneumonia or aspiration. Difficult to exclude fullness in the left hilum. Recommend follow-up to ensure resolution.  Patchy interstitial densities in both lungs may represent a combination of chronic changes and low lung volumes.   Electronically Signed   By: Markus Daft M.D.   On: 08/14/2014 17:21     EKG Interpretation   Date/Time:  Monday August 14 2014 15:41:20 EST Ventricular Rate:  84 PR Interval:  189 QRS Duration: 82 QT Interval:  377 QTC Calculation: 446 R Axis:   79 Text Interpretation:  Sinus rhythm No significant change since last  tracing Confirmed by Nili Honda  MD, Kirk Sampley ([id number]) on 08/14/2014 3:57:09 PM      MDM   Final diagnoses:  Chest pain    DYMON SUMMERHILL is a 81 y.o. female here with feel like something is stuck in chest. Consider food impaction vs worsening stricture. Less likely to be ACS. Will get labs, CXR. Will try glucagon but will likely need GI consult.   7:09 PM CXR showed possible aspiration pneumonia. WBC 19. Started on HCAP coverage. She was followed with [facility] before (even though Dr. Ardis Hughs scoped here several weeks ago but she never saw him in the office). I called Dr. Michail Sermon, who will see patient in AM. Recommend NPO for endoscopy. Hospitalist will admit.  Wandra Arthurs, MD 08/14/14 1910

## 2014-08-15 DIAGNOSIS — K219 Gastro-esophageal reflux disease without esophagitis: Secondary | ICD-10-CM

## 2014-08-15 DIAGNOSIS — K222 Esophageal obstruction: Secondary | ICD-10-CM

## 2014-08-15 LAB — TROPONIN I: Troponin I: <0.03 ng/mL (ref <0.031)

## 2014-08-15 LAB — LIPID PANEL
CHOLESTEROL: 216 mg/dL — AB (ref 0–200)
HDL: 28 mg/dL — ABNORMAL LOW (ref >39)
LDL CALC: 146 mg/dL — AB (ref 0–99)
TRIGLYCERIDES: 211 mg/dL — AB (ref <150)
Total CHOL/HDL Ratio: 7.7 RATIO
VLDL: 42 mg/dL — ABNORMAL HIGH (ref 0–40)

## 2014-08-15 LAB — COMPREHENSIVE METABOLIC PANEL
ALBUMIN: 3.2 g/dL — AB (ref 3.5–5.2)
ALK PHOS: 88 U/L (ref 39–117)
ALT: 31 U/L (ref 0–35)
AST: 28 U/L (ref 0–37)
Anion gap: 6 (ref 5–15)
BUN: 49 mg/dL — ABNORMAL HIGH (ref 6–23)
CO2: 24 mmol/L (ref 19–32)
Calcium: 8.4 mg/dL (ref 8.4–10.5)
Chloride: 104 mEq/L (ref 96–112)
Creatinine, Ser: 2.54 mg/dL — ABNORMAL HIGH (ref 0.50–1.10)
GFR calc Af Amer: 19 mL/min — ABNORMAL LOW (ref >90)
GFR calc non Af Amer: 17 mL/min — ABNORMAL LOW (ref >90)
Glucose, Bld: 63 mg/dL — ABNORMAL LOW (ref 70–99)
POTASSIUM: 4.5 mmol/L (ref 3.5–5.1)
SODIUM: 134 mmol/L — AB (ref 135–145)
TOTAL PROTEIN: 7 g/dL (ref 6.0–8.3)
Total Bilirubin: 0.7 mg/dL (ref 0.3–1.2)

## 2014-08-15 LAB — CBC WITH DIFFERENTIAL/PLATELET
Basophils Absolute: 0.1 10*3/uL (ref 0.0–0.1)
Basophils Relative: 0 % (ref 0–1)
EOS PCT: 3 % (ref 0–5)
Eosinophils Absolute: 0.4 10*3/uL (ref 0.0–0.7)
HEMATOCRIT: 30.5 % — AB (ref 36.0–46.0)
Hemoglobin: 9.8 g/dL — ABNORMAL LOW (ref 12.0–15.0)
LYMPHS ABS: 4.3 10*3/uL — AB (ref 0.7–4.0)
LYMPHS PCT: 36 % (ref 12–46)
MCH: 31.5 pg (ref 26.0–34.0)
MCHC: 32.1 g/dL (ref 30.0–36.0)
MCV: 98.1 fL (ref 78.0–100.0)
MONO ABS: 1 10*3/uL (ref 0.1–1.0)
Monocytes Relative: 8 % (ref 3–12)
Neutro Abs: 6.1 10*3/uL (ref 1.7–7.7)
Neutrophils Relative %: 52 % (ref 43–77)
Platelets: 255 10*3/uL (ref 150–400)
RBC: 3.11 MIL/uL — ABNORMAL LOW (ref 3.87–5.11)
RDW: 14.5 % (ref 11.5–15.5)
WBC: 11.7 10*3/uL — ABNORMAL HIGH (ref 4.0–10.5)

## 2014-08-15 LAB — GLUCOSE, CAPILLARY
GLUCOSE-CAPILLARY: 66 mg/dL — AB (ref 70–99)
GLUCOSE-CAPILLARY: 164 mg/dL — AB (ref 70–99)
Glucose-Capillary: 149 mg/dL — ABNORMAL HIGH (ref 70–99)
Glucose-Capillary: 85 mg/dL (ref 70–99)

## 2014-08-15 LAB — BRAIN NATRIURETIC PEPTIDE: B NATRIURETIC PEPTIDE 5: 198 pg/mL — AB (ref 0.0–100.0)

## 2014-08-15 LAB — STREP PNEUMONIAE URINARY ANTIGEN: Strep Pneumo Urinary Antigen: NEGATIVE

## 2014-08-15 MED ORDER — DEXTROSE 50 % IV SOLN
INTRAVENOUS | Status: AC
  Administered 2014-08-15: 25 mL
  Filled 2014-08-15: qty 50

## 2014-08-15 MED ORDER — CETYLPYRIDINIUM CHLORIDE 0.05 % MT LIQD
7.0000 mL | Freq: Two times a day (BID) | OROMUCOSAL | Status: DC
  Administered 2014-08-15 – 2014-08-18 (×6): 7 mL via OROMUCOSAL

## 2014-08-15 MED ORDER — PIPERACILLIN-TAZOBACTAM IN DEX 2-0.25 GM/50ML IV SOLN
2.2500 g | Freq: Three times a day (TID) | INTRAVENOUS | Status: DC
  Administered 2014-08-15 – 2014-08-18 (×11): 2.25 g via INTRAVENOUS
  Filled 2014-08-15 (×15): qty 50

## 2014-08-15 MED ORDER — SODIUM CHLORIDE 0.9 % IV SOLN
INTRAVENOUS | Status: DC
  Administered 2014-08-15 – 2014-08-16 (×2): via INTRAVENOUS

## 2014-08-15 MED ORDER — AMLODIPINE BESYLATE 5 MG PO TABS
5.0000 mg | ORAL_TABLET | Freq: Every day | ORAL | Status: DC
  Administered 2014-08-15 – 2014-08-18 (×4): 5 mg via ORAL
  Filled 2014-08-15 (×4): qty 1

## 2014-08-15 MED ORDER — SODIUM CHLORIDE 0.9 % IV SOLN: INTRAVENOUS | Status: DC

## 2014-08-15 NOTE — Progress Notes (Signed)
SLP Cancellation Note  Patient Details Name: ELVIE PALOMO MRN: [id number] DOB: 02/21/1932   Cancelled evaluation:    Order received for clinical swallow evaluation.  Pt being followed by GI and is scheduled for endoscopy next date secondary to hx of esophageal dysphagia.  Will defer evaluation and f/u s/p endoscopy to determine if our services are still warranted.       Alonzo Loving L. Tivis Ringer, [place] CCC/SLP Pager [phone]  Juan Quam Laurice 08/15/2014, 2:14 PM

## 2014-08-15 NOTE — Progress Notes (Signed)
Susan Fantasia, NP made aware of patient not being able to take Primidone PO due to being NPO and having difficulty swallowing.

## 2014-08-15 NOTE — Progress Notes (Signed)
UR completed Linzy Darling K. Larrie Lucia, RN, BSN, MSHL, CCM  08/15/2014 12:43 PM

## 2014-08-15 NOTE — Consult Note (Signed)
[facility] Gastroenterology Consultation Note  Referring Provider: Dr. Ivor Costa ([facility]) Primary Care Physician:  Henrine Screws, MD  Reason for Consultation:  Trouble swallowing  HPI: Susan Mccoy is a 81 y.o. female presenting with trouble swallowing.  History is a bit difficult, but after extensive questioning, patient relates having pain with swallowing for the past several days, which has in turn minimized significantly her oral intake.  Admission notes endorse patient has dysphagia and possible foreign body sensation in the esophagus, but patient denies this despite extensive questioning on the matter.  Some vomiting yesterday after she was given morphine.  Some chest wall discomfort when she moves as well.  Despite xray findings of possible aspiration pneumonia, patient denies significant shortness of breath, but does have some significantly increased fatigue.  No abdominal pain, blood in stool, change in bowel habits.  Longstanding patient of Dr. Florencia Reasons, apparently had a couple endoscopies with esophageal stricture dilation, but her last endoscopy in 2011 showed normal-appearing esophagus.  Has GERD from time-to-time.  Denies weight loss, until the past few days.   Past Medical History  Diagnosis Date  . Hypertension   . Thyroid disease   . Abnormal LFTs     Fatty liver by [place]  . Hypercholesterolemia   . Carpal tunnel syndrome   . DJD (degenerative joint disease)   . Shingles   . Meralgia paraesthetica   . GERD (gastroesophageal reflux disease)   . Pulmonary fibrosis   . Hemorrhoids   . Diverticulosis   . Normocytic normochromic anemia   . History of recurrent TIAs   . UTI (lower urinary tract infection)   . Chronic renal disease, stage 4, severely decreased glomerular filtration rate between 15-29 mL/min/1.73 square meter   . Anemia of chronic kidney failure   . Diabetes mellitus without complication     Type 2  . CHF (congestive heart failure)     pt not  aware of this  . Bipolar disorder     in the past  . Constipation   . Macular degeneration   . Seizures     questionably had when she had TIA's, takes Primodone, states it has helped with the "shaking"  . Complication of anesthesia     trouble with putting her to sleep - 12/15 - pt doesn't remember this    Past Surgical History  Procedure Laterality Date  . Back surgery      X 2 - lumbar  . Tonsillectomy    . Cataract extraction, bilateral    . Breast biopsy  B/L- said to be negative for malignancy.  . Colonoscopy    . Colonoscopy N/A 08/08/2013    Procedure: COLONOSCOPY;  Surgeon: [name], MD;  Location: [facility];  Service: Endoscopy;  Laterality: N/A;  EGD is possible  . Carpal tunnel release Left   . Eye surgery Bilateral     cataract surgery with lens implant  . Bascilic vein transposition Left 08/03/2014    Procedure:  INSERTION of Left Arm Gortex graft;  Surgeon: Angelia Mould, MD;  Location: [facility];  Service: Vascular;  Laterality: Left;    Prior to Admission medications   Medication Sig Start Date End Date Taking? Authorizing Provider  acetaminophen (TYLENOL) 500 MG tablet Take 1,000 mg by mouth every 6 (six) hours as needed for moderate pain.    Yes Historical Provider, MD  allopurinol (ZYLOPRIM) 300 MG tablet Take 150 mg by mouth daily.   Yes Historical Provider, MD  aspirin  EC 81 MG tablet Take 81 mg by mouth daily.   Yes Historical Provider, MD  atenolol (TENORMIN) 25 MG tablet Take 25 mg by mouth daily.    Yes Historical Provider, MD  beta carotene w/minerals (OCUVITE) tablet Take 1 tablet by mouth daily.   Yes Historical Provider, MD  calcitRIOL (ROCALTROL) 0.25 MCG capsule Take 0.25 mcg by mouth every other day.   Yes Historical Provider, MD  cholecalciferol (VITAMIN D) 1000 UNITS tablet Take 1,000 Units by mouth daily.   Yes Historical Provider, MD  Cyanocobalamin (VITAMIN B-12 IJ) Inject 1 Syringe as directed every 30 (thirty) days. As directed.  Due 08/19/2013   Yes Historical Provider, MD  cyclobenzaprine (FLEXERIL) 10 MG tablet Take 5 mg by mouth at bedtime.   Yes Historical Provider, MD  ferrous sulfate 325 (65 FE) MG tablet Take 325 mg by mouth daily with breakfast.   Yes Historical Provider, MD  glipiZIDE (GLUCOTROL XL) 10 MG 24 hr tablet Take 10 mg by mouth daily.   Yes Historical Provider, MD  HYDROcodone-acetaminophen (NORCO) 5-325 MG per tablet Take 1 tablet by mouth every 6 (six) hours as needed for moderate pain. 08/03/14  Yes Alvia Grove, PA-C  hydrOXYzine (ATARAX/VISTARIL) 25 MG tablet Take 25 mg by mouth 2 (two) times daily as needed for itching.    Yes Historical Provider, MD  lansoprazole (PREVACID) 30 MG capsule Take 30 mg by mouth daily. Before a meal once a day   Yes Historical Provider, MD  primidone (MYSOLINE) 50 MG tablet Take 50 mg by mouth 2 (two) times daily.   Yes Historical Provider, MD  simvastatin (ZOCOR) 20 MG tablet Take 20 mg by mouth at bedtime.  03/01/14  Yes Historical Provider, MD  TORSEMIDE PO Take 40 mg by mouth daily.    Yes Historical Provider, MD    Current Facility-Administered Medications  Medication Dose Route Frequency Provider Last Rate Last Dose  . acetaminophen (TYLENOL) tablet 1,000 mg  1,000 mg Oral Q8H PRN Ivor Costa, MD      . allopurinol (ZYLOPRIM) tablet 150 mg  150 mg Oral Daily Ivor Costa, MD      . antiseptic oral rinse (Castana / CETYLPYRIDINIUM CHLORIDE 0.05%) solution 7 mL  7 mL Mouth Rinse BID Kelvin Cellar, MD      . aspirin EC tablet 81 mg  81 mg Oral Daily Ivor Costa, MD      . atenolol (TENORMIN) tablet 25 mg  25 mg Oral Daily Ivor Costa, MD      . beta carotene w/minerals (OCUVITE) tablet 1 tablet  1 tablet Oral Daily Ivor Costa, MD      . calcitRIOL (ROCALTROL) capsule 0.25 mcg  0.25 mcg Oral QODAY Ivor Costa, MD      . cholecalciferol (VITAMIN D) tablet 1,000 Units  1,000 Units Oral Daily Ivor Costa, MD      . cyclobenzaprine (FLEXERIL) tablet 5 mg  5 mg Oral QHS Ivor Costa,  MD   5 mg at 08/14/14 2302  . docusate sodium (COLACE) capsule 100 mg  100 mg Oral BID Ivor Costa, MD   100 mg at 08/14/14 2302  . ferrous sulfate tablet 325 mg  325 mg Oral Q breakfast Ivor Costa, MD   325 mg at 08/15/14 0800  . heparin injection 5,000 Units  5,000 Units Subcutaneous 3 times per day Ivor Costa, MD   5,000 Units at 08/15/14 0636  . HYDROcodone-acetaminophen (NORCO/VICODIN) 5-325 MG per tablet 1 tablet  1 tablet Oral  Q6H PRN Ivor Costa, MD      . hydrOXYzine (ATARAX/VISTARIL) tablet 25 mg  25 mg Oral BID PRN Ivor Costa, MD      . insulin aspart (novoLOG) injection 0-9 Units  0-9 Units Subcutaneous TID WC Ivor Costa, MD   0 Units at 08/15/14 0800  . pantoprazole (PROTONIX) EC tablet 20 mg  20 mg Oral Daily Ivor Costa, MD      . piperacillin-tazobactam (ZOSYN) IVPB 2.25 g  2.25 g Intravenous 3 times per day Kelvin Cellar, MD   2.25 g at 08/15/14 1135  . primidone (MYSOLINE) tablet 50 mg  50 mg Oral BID Ivor Costa, MD   50 mg at 08/14/14 2302  . simvastatin (ZOCOR) tablet 20 mg  20 mg Oral QHS Ivor Costa, MD   20 mg at 08/14/14 2302  . [START ON 08/16/2014] vancomycin (VANCOCIN) IVPB 750 mg/150 ml premix  750 mg Intravenous Q48H Rebecka Apley, [facility]        Allergies as of 08/14/2014 - Review Complete 08/14/2014  Allergen Reaction Noted  . Morphine and related Other (See Comments) 08/14/2014  . Penicillins Other (See Comments)   . Sulfonamide derivatives Other (See Comments)   . Celebrex [celecoxib] Rash 12/18/2011  . Valium [diazepam] Other (See Comments) 08/14/2014    Family History  Problem Relation Age of Onset  . Emphysema Father     smoker  . Diabetes Father   . Varicose Veins Father   . Heart disease Father     before age 73  . Heart attack Father   . Emphysema Brother     smoker  . Cancer Brother   . Varicose Veins Mother   . Cancer Sister   . Diabetes Sister   . Cancer Sister   . Cancer Brother     History   Social History  . Marital Status: Married    Spouse  Name: N/A    Number of Children: N/A  . Years of Education: N/A   Occupational History  . retired    Social History Main Topics  . Smoking status: Former Smoker -- 2.00 packs/day for 40 years    Quit date: 08/05/1991  . Smokeless tobacco: Never Used  . Alcohol Use: No  . Drug Use: No  . Sexual Activity: No   Other Topics Concern  . Not on file   Social History Narrative   Married, lives in [place] - husband disabled after stroke 02/2013   Retired from city of [place]   2 daughters ([place] and [place], [place]) 1 son Laurance [place], [place])   Updated 08/07/2013             Review of Systems: As per HPI, all others negative  Physical Exam: Vital signs in last 24 hours: Temp:  [97.5 F (36.4 C)-98.6 F (37 C)] 98.6 F (37 C) (01/12 1034) Pulse Rate:  [72-84] 74 (01/12 1034) Resp:  [11-20] 18 (01/12 1034) BP: (150-188)/(55-97) 150/60 mmHg (01/12 1034) SpO2:  [99 %-100 %] 100 % (01/12 1034) FiO2 (%):  [28 %] 28 % (01/11 2025) Weight:  [57.2 kg (126 lb 1.7 oz)] 57.2 kg (126 lb 1.7 oz) (01/12 0525) Last BM Date: 08/12/14 General:   Alert,  Well-developed, well-nourished, pleasant and cooperative in NAD Head:  Normocephalic and atraumatic. Eyes:  Sclera clear, no icterus.   Conjunctiva pink. Ears:  Normal auditory acuity. Nose:  No deformity, discharge,  or lesions. Mouth:  No deformity or lesions.  Oropharynx pink & moist. Neck:  Supple; no masses or thyromegaly. Lungs:  Faint crackles and post-expiratory wheezing, mild, left lung field.   Otherwise, no wheezes, crackles, or rhonchi. No acute distress. Heart:  Regular rate and rhythm; no murmurs, clicks, rubs,  or gallops. Abdomen:  Soft, nontender and nondistended. No masses, hepatosplenomegaly or hernias noted. Normal bowel sounds, without guarding, and without rebound.     Msk:  Symmetrical without gross deformities. Normal posture. Pulses:  Normal pulses noted. Extremities:  Without clubbing or edema. Neurologic:  Alert  and  oriented x4;  Diffusely weak, otherwise grossly normal neurologically. Skin:  Scattered ecchymoses, otherwise Intact without significant lesions or rashes. Psych:  Alert and cooperative. Normal mood and affect.   Lab Results:  Recent Labs  08/14/14 1547 08/14/14 1751 08/15/14 0634  WBC 19.5*  --  11.7*  HGB 11.4* 12.6 9.8*  HCT 34.9* 37.0 30.5*  PLT 257  --  255   BMET  Recent Labs  08/14/14 1547 08/14/14 1751 08/15/14 0634  NA 131* 135 134*  K 4.9 5.1 4.5  CL 101 99 104  CO2 25  --  24  GLUCOSE 239* 245* 63*  BUN 52* 50* 49*  CREATININE 2.60* 2.30* 2.54*  CALCIUM 8.9  --  8.4   LFT  Recent Labs  08/15/14 0634  PROT 7.0  ALBUMIN 3.2*  AST 28  ALT 31  ALKPHOS 88  BILITOT 0.7   PT/INR  Recent Labs  08/14/14 2135  LABPROT 14.7  INR 1.14    Studies/Results: Dg Chest 2 View  08/14/2014   CLINICAL DATA:  Tightness in the esophagus with chest pain.  EXAM: CHEST  2 VIEW  COMPARISON:  08/07/2013  FINDINGS: There are patchy interstitial lung markings throughout both lungs. The left hemidiaphragm is obscured and there is concern for airspace disease in the left lower lobe. Cannot exclude fullness in the left hilar region. The aortic arch is heavily calcified. No large pleural effusions based on the lateral view. Difficult to assess the cardiac size.  IMPRESSION: Densities in the left lower chest that obscure the left hemidiaphragm. Findings are concerning for pneumonia or aspiration. Difficult to exclude fullness in the left hilum. Recommend follow-up to ensure resolution.  Patchy interstitial densities in both lungs may represent a combination of chronic changes and low lung volumes.   Electronically Signed   By: Markus Daft Mccoy.D.   On: 08/14/2014 17:21   Impression:  1.  Odynophagia. 2.  Gastroesophageal reflux. 3.  History esophageal stricture per report.  However, endoscopy in 2011 done by Dr. Sammuel Cooper showed normal esophagus without esophagitis or  stricture. 4.  Recent left lower pneumonia, possibly aspiration-related.  Plan:  1.  Sips clear liquids ok. 2.  PPI. 3.  Endoscopy tomorrow morning with Dr. Cristina Gong.   LOS: 1 day   Susan Mccoy  08/15/2014, 1:52 PM

## 2014-08-15 NOTE — Progress Notes (Signed)
[facility] PROGRESS NOTE  Susan Mccoy KDT:[id number] DOB: [birth year] DOA: 08/14/2014 PCP: Henrine Screws, MD  Assessment/Plan: 1. Suspected esophageal strictures. -Patient with history of esophageal strictures presenting with complaints of dysphagia. -She was seen and evaluated by GI, with plans for her to undergo upper endoscopy on 08/16/2014. -She was placed on dysphagia 1 diet will monitor closely.  2.  Possible aspiration pneumonia. -Chest x-ray performed in the emergency department revealed densities in the left lower lobe that could be secondary to aspiration pneumonia -Patient is being covered with empiric IV antimicrobial therapy with Zosyn. Will discontinue IV vancomycin -It is possible she may have aspirated from esophageal strictures.  3. Hypertension.  -Her blood pressures remain elevated with systolic blood pressures in the 150s -Will add amlodipine 5 mg by mouth daily. Continue atenolol 25 mg by mouth daily  4.  Stage III chronic kidney disease -Patient with baseline creatinine near 2.2-2.4 -A.m. labs showing creatinine of 2.54. -Will provide IV fluids overnight as she will likely undergo procedure in a.m.  Code Status: Full code Family Communication: Spoke to her daughter was present at bedside Disposition Plan: Plan for patient to undergo EGD in a.m.  Consultants:  GI  Procedures:  Plan for EGD on 08/16/2014  Antibiotics:  Zosyn  HPI/Subjective: Patient is a pleasant 81 year old female with a past medical history of fiducial strictures undergoing dilatation approximate 5 years ago was admitted to medicine service on 08/14/2014. She presented with complaints of difficulty swallowing as well as having some cough. Initial workup performed in the emergency department revealed a white count of 11,700. She had a chest x-ray which revealed densities in the left lower chest could be concerning for pneumonia or aspiration. She was started on empiric IV  antimicrobial therapy for possible aspiration pneumonia. Meanwhile GI was consulted, plan for patient to undergo upper endoscopy on 08/16/2014.  Objective: Filed Vitals:   08/15/14 1758  BP: 155/58  Pulse: 74  Temp: 97.7 F (36.5 C)  Resp: 18    Intake/Output Summary (Last 24 hours) at 08/15/14 1812 Last data filed at 08/15/14 1804  Gross per 24 hour  Intake    660 ml  Output    650 ml  Net     10 ml   Filed Weights   08/14/14 2031 08/15/14 0525  Weight: 57.2 kg (126 lb 1.7 oz) 57.2 kg (126 lb 1.7 oz)    Exam:   General:  Patient is awake, alert, no acute distress  Cardiovascular: Regular rate and rhythm normal S1-S2  Respiratory: Normal respiratory effort  Abdomen: Soft nontender nondistended  Musculoskeletal: No edema  Data Reviewed: Basic Metabolic Panel:  Recent Labs Lab 08/14/14 1547 08/14/14 1751 08/15/14 0634  NA 131* 135 134*  K 4.9 5.1 4.5  CL 101 99 104  CO2 25  --  24  GLUCOSE 239* 245* 63*  BUN 52* 50* 49*  CREATININE 2.60* 2.30* 2.54*  CALCIUM 8.9  --  8.4   Liver Function Tests:  Recent Labs Lab 08/15/14 0634  AST 28  ALT 31  ALKPHOS 88  BILITOT 0.7  PROT 7.0  ALBUMIN 3.2*   No results for input(s): LIPASE, AMYLASE in the last 168 hours. No results for input(s): AMMONIA in the last 168 hours. CBC:  Recent Labs Lab 08/14/14 1547 08/14/14 1751 08/15/14 0634  WBC 19.5*  --  11.7*  NEUTROABS  --   --  6.1  HGB 11.4* 12.6 9.8*  HCT 34.9* 37.0 30.5*  MCV 99.7  --  98.1  PLT 257  --  255   Cardiac Enzymes: No results for input(s): CKTOTAL, CKMB, CKMBINDEX, TROPONINI in the last 168 hours. BNP (last 3 results) No results for input(s): PROBNP in the last 8760 hours. CBG:  Recent Labs Lab 08/14/14 2304 08/15/14 0738 08/15/14 0824 08/15/14 1116 08/15/14 1756  GLUCAP 130* 66* 149* 85 164*    No results found for this or any previous visit (from the past 240 hour(s)).   Studies: Dg Chest 2 View  08/14/2014    CLINICAL DATA:  Tightness in the esophagus with chest pain.  EXAM: CHEST  2 VIEW  COMPARISON:  08/07/2013  FINDINGS: There are patchy interstitial lung markings throughout both lungs. The left hemidiaphragm is obscured and there is concern for airspace disease in the left lower lobe. Cannot exclude fullness in the left hilar region. The aortic arch is heavily calcified. No large pleural effusions based on the lateral view. Difficult to assess the cardiac size.  IMPRESSION: Densities in the left lower chest that obscure the left hemidiaphragm. Findings are concerning for pneumonia or aspiration. Difficult to exclude fullness in the left hilum. Recommend follow-up to ensure resolution.  Patchy interstitial densities in both lungs may represent a combination of chronic changes and low lung volumes.   Electronically Signed   By: Markus Daft M.D.   On: 08/14/2014 17:21    Scheduled Meds: . allopurinol  150 mg Oral Daily  . antiseptic oral rinse  7 mL Mouth Rinse BID  . aspirin EC  81 mg Oral Daily  . atenolol  25 mg Oral Daily  . beta carotene w/minerals  1 tablet Oral Daily  . calcitRIOL  0.25 mcg Oral QODAY  . cholecalciferol  1,000 Units Oral Daily  . cyclobenzaprine  5 mg Oral QHS  . docusate sodium  100 mg Oral BID  . ferrous sulfate  325 mg Oral Q breakfast  . heparin  5,000 Units Subcutaneous 3 times per day  . insulin aspart  0-9 Units Subcutaneous TID WC  . pantoprazole  20 mg Oral Daily  . piperacillin-tazobactam (ZOSYN)  IV  2.25 g Intravenous 3 times per day  . primidone  50 mg Oral BID  . simvastatin  20 mg Oral QHS  . [START ON 08/16/2014] vancomycin  750 mg Intravenous Q48H   Continuous Infusions:   Principal Problem:   Difficulty swallowing Active Problems:   Diabetes mellitus without complication   HYPERCHOLESTEROLEMIA   Anemia of chronic kidney failure   Hypertension   GERD (gastroesophageal reflux disease)   CHF (congestive heart failure)   Chronic renal disease, stage 4,  severely decreased glomerular filtration rate between 15-29 mL/min/1.73 square meter   Aspiration pneumonia   History of transient ischemic attack (TIA)    Time spent: 35 min    Kelvin Cellar  [facility] Pager [phone]. If 7PM-7AM, please contact night-coverage at [email or web address], password [facility] 08/15/2014, 6:12 PM  LOS: 1 day

## 2014-08-15 NOTE — Evaluation (Signed)
Occupational Therapy Evaluation Patient Details Name: Susan Mccoy MRN: [id number] DOB: 1931-12-27 Today's Date: 08/15/2014    History of Present Illness Pt admitted 08/14/14 w/ c/o difficulty swallowing and has suspected esophegeal stricture.   Clinical Impression   Pt & daughter were educated in role of OT and POC/goals. Pt w/ h/o dementia that daughter confirms and pt denies. Pt has personal care attendent 2x/day whom assists with all ADL's and homemaking. Poor balance and posterior lean noted in standing and sitting EOB, will follow acutely for OT.    Follow Up Recommendations  Supervision/Assistance - 24 hour;Home health OT    Equipment Recommendations  None recommended by OT    Recommendations for Other Services       Precautions / Restrictions Precautions Precautions: Fall Restrictions Weight Bearing Restrictions: No      Mobility Bed Mobility Overal bed mobility: Needs Assistance Bed Mobility: Supine to Sit;Sit to Supine     Supine to sit: Min assist Sit to supine: Mod assist   General bed mobility comments: be careful to avoid LUE secondary to graft on 08/03/14.  Transfers Overall transfer level: Needs assistance Equipment used: Rolling walker (2 wheeled) (has 4 wheeled walker at home as well) Transfers: Sit to/from [employer] Sit to Stand: Min assist;Mod assist Stand pivot transfers: Mod assist       General transfer comment: Pt with posterior lean in sit and standing noted, pt/daughter report poor balance.    Balance Overall balance assessment: Needs assistance Sitting-balance support: No upper extremity supported;Feet supported Sitting balance-Leahy Scale: Poor Sitting balance - Comments: Posterior lean and req vc and tc for safety Postural control: Posterior lean Standing balance support: Bilateral upper extremity supported Standing balance-Leahy Scale: Poor Standing balance comment: Posterior lean noted, pt standing ~49min  before needing to sit, requires consitent verbal and tactile cues                            ADL Overall ADL's : Needs assistance/impaired     Grooming: Wash/dry hands;Wash/dry face;Sitting;Set up   Upper Body Bathing: Set up;Sitting;Min guard   Lower Body Bathing: Moderate assistance;Sit to/from stand   Upper Body Dressing : Set up;Sitting;Min guard   Lower Body Dressing: Moderate assistance;Sit to/from stand;Cueing for sequencing;Cueing for safety Lower Body Dressing Details (indicate cue type and reason): Posterior lean noted in sitting and standing Toilet Transfer: Moderate assistance;Stand-pivot;BSC;RW (Simulated sit to stand from EOB) Toilet Transfer Details (indicate cue type and reason): Posterior lean noted in sitting and standing Toileting- Clothing Manipulation and Hygiene: Maximal assistance;Sit to/from stand       Functional mobility during ADLs: Minimal assistance;Moderate assistance (Sit to stand with initial Min A followed by posterior lean and vc's for safety and hand placement w/ RW. Pt fatigues easily) General ADL Comments: Pt & daughter were educated in role of OT and POC/goals. Pt w/ h/o dementia that daughter confirms and pt denies. Pt has personal care attendent 2x/day whom assists with all ADL's and homemaking. Poor balance and posterior lean noted in standing and sitting EOB.     Vision  Macular degeneration, reading glasses. No change from baseline                   Perception     Praxis      Pertinent Vitals/Pain Pain Assessment: 0-10 Pain Score: 6  Pain Descriptors / Indicators: Aching Pain Intervention(s): Limited activity within patient's tolerance;Patient requesting pain meds-RN notified (Pt  is NPO, RN made aware)     Hand Dominance Right   Extremity/Trunk Assessment Upper Extremity Assessment Upper Extremity Assessment: LUE deficits/detail;Generalized weakness;Overall [facility] for tasks assessed LUE Deficits / Details: LUE  arm graft 08/03/14); restricted use   Lower Extremity Assessment Lower Extremity Assessment: Defer to PT evaluation       Communication Communication Communication: No difficulties   Cognition Arousal/Alertness: Awake/alert Behavior During Therapy: WFL for tasks assessed/performed Overall Cognitive Status: History of cognitive impairments - at baseline       Memory: Decreased short-term memory;Decreased recall of precautions             General Comments       Exercises       Shoulder Instructions      Home Living Family/patient expects to be discharged to:: Private residence Living Arrangements: Spouse/significant other Available Help at Discharge: Personal care attendant ([facility] 8-12 and 3-8 pm) Type of Home: House Home Access: Stairs to enter [employer] of Steps: 3 Entrance Stairs-Rails: Left Home Layout: One level     Bathroom Shower/Tub: Tub/shower unit;Curtain   Bathroom Toilet: Handicapped height     Home Equipment: Environmental consultant - 2 wheels;Shower seat;Grab bars - toilet;Hand held shower head          Prior Functioning/Environment Level of Independence: Needs assistance  Gait / Transfers Assistance Needed: Ambulates w/ RW ADL's / Homemaking Assistance Needed: Personal care attendant assists w/ homemaking and meal prep, bathing and dressing. Communication / Swallowing Assistance Needed: NPO: possible espohigeal stricture      OT Diagnosis: Generalized weakness;Cognitive deficits;Acute pain   OT Problem List: Decreased strength;Decreased activity tolerance;Impaired balance (sitting and/or standing);Decreased knowledge of use of DME or AE;Decreased safety awareness;Pain   OT Treatment/Interventions: Self-care/ADL training;DME and/or AE instruction;Patient/family education;Therapeutic activities;Balance training    OT Goals(Current goals can be found in the care plan section) Acute Rehab OT Goals Patient Stated Goal: "Get this fixed"  pt wants to eat and have f/u w/ MD Time For Goal Achievement: 08/29/14 Potential to Achieve Goals: Fair  OT Frequency: Min 2X/week   Barriers to D/C:            Co-evaluation              End of Session Equipment Utilized During Treatment: Gait belt;Rolling walker Nurse Communication: Mobility status;Other (comment) (Pt asking about testing and NPO status)  Activity Tolerance: Patient limited by fatigue Patient left: in bed;with call bell/phone within reach;with bed alarm set;with family/visitor present   Time: 6269-4854 OT Time Calculation (min): 32 min Charges:  OT General Charges $OT Visit: 1 Procedure OT Evaluation $Initial OT Evaluation Tier I: 1 Procedure OT Treatments $Therapeutic Activity: 8-22 mins G-Codes:    [place], Marlou Sa, OTR/L 08/15/2014, 11:26 AM

## 2014-08-15 NOTE — Progress Notes (Signed)
INITIAL NUTRITION ASSESSMENT  DOCUMENTATION CODES Per approved criteria  -Not Applicable   INTERVENTION: - Once diet upgraded, add Nepro Shake po BID, each supplement provides 425 kcal and 19 grams protein  NUTRITION DIAGNOSIS: Inadequate oral intake related to inability to eat as evidenced by NPO.   Goal: Pt to meet >/= 90% of their estimated nutrition needs   Monitor:  Weight trend, NPO, acceptance of supplements, labs  Reason for Assessment: MST  81 y.o. female  Admitting Dx: Difficulty swallowing  ASSESSMENT: 81 y.o. female with past medical history of esophageal stricture (s/p of dilation 5 years ago), diabetes mellitus, congestive heart failure, seizures, hypertension, hyperlipidemia, GERD, history of TIA, history of chronic kidney disease-stage IV, anemia, diverticulosis, history of lower GI bleeding, who presented with difficulty swallowing.  - Spoke with pt and daughter.  - Pt with no recent weight changes. Pt lives at home with husband. She eats 2 sausage biscuits with jelly each morning, skips lunch, and eats food brought by either meals on wheels or other volunteers for dinner. Her dinner is usually a sandwich from a fast food restaurant. She occasionally eats peanut butter crackers for snack. - Pt's daughter was concerned that pt was told to stop drinking boost due to her kidney disease. RD recommended Nepro Shakes as supplements at home. Will order once pt's diet upgraded.  - Pt's daughter with questions regarding diet for kidney disease. Pt currently watches foods high in potassium and phosphorus. Questions answered.  - Currently pt is NPO for endoscopy due to difficulty swallowing and possible pneumonia. Pt with no PO x 3 days.   Labs: Na low K WNL CBGs 66-149  Height: Ht Readings from Last 1 Encounters:  08/14/14 5\' 1"  (1.549 m)    Weight: Wt Readings from Last 1 Encounters:  08/15/14 126 lb 1.7 oz (57.2 kg)    Ideal Body Weight: 47.8 kg  % Ideal  Body Weight: 120%  Wt Readings from Last 10 Encounters:  08/15/14 126 lb 1.7 oz (57.2 kg)  08/03/14 128 lb 9 oz (58.316 kg)  07/19/14 128 lb 9.6 oz (58.333 kg)  08/09/13 147 lb 4.3 oz (66.8 kg)  07/25/13 128 lb (58.06 kg)  01/06/12 136 lb 3.9 oz (61.8 kg)  12/22/11 135 lb 12.8 oz (61.598 kg)  04/17/10 148 lb (67.132 kg)  07/13/09 146 lb 2.1 oz (66.285 kg)  01/22/09 153 lb (69.4 kg)    Usual Body Weight: 126-128 lbs  % Usual Body Weight: 100%  BMI:  Body mass index is 23.84 kg/(m^2).  Estimated Nutritional Needs: Kcal: 1600-1800 Protein: 45-55 g Fluid: 1.6-1.8 L/day  Skin: closed incision on left arm  Diet Order:    EDUCATION NEEDS: -Education needs addressed   Intake/Output Summary (Last 24 hours) at 08/15/14 1113 Last data filed at 08/15/14 0859  Gross per 24 hour  Intake    600 ml  Output    450 ml  Net    150 ml    Last BM: prior to admission   Labs:   Recent Labs Lab 08/14/14 1547 08/14/14 1751 08/15/14 0634  NA 131* 135 134*  K 4.9 5.1 4.5  CL 101 99 104  CO2 25  --  24  BUN 52* 50* 49*  CREATININE 2.60* 2.30* 2.54*  CALCIUM 8.9  --  8.4  GLUCOSE 239* 245* 63*    CBG (last 3)   Recent Labs  08/14/14 2304 08/15/14 0738 08/15/14 0824  GLUCAP 130* 66* 149*    Scheduled Meds: .  allopurinol  150 mg Oral Daily  . antiseptic oral rinse  7 mL Mouth Rinse BID  . aspirin EC  81 mg Oral Daily  . atenolol  25 mg Oral Daily  . beta carotene w/minerals  1 tablet Oral Daily  . calcitRIOL  0.25 mcg Oral QODAY  . cholecalciferol  1,000 Units Oral Daily  . cyclobenzaprine  5 mg Oral QHS  . docusate sodium  100 mg Oral BID  . ferrous sulfate  325 mg Oral Q breakfast  . heparin  5,000 Units Subcutaneous 3 times per day  . insulin aspart  0-9 Units Subcutaneous TID WC  . pantoprazole  20 mg Oral Daily  . piperacillin-tazobactam (ZOSYN)  IV  2.25 g Intravenous 3 times per day  . primidone  50 mg Oral BID  . simvastatin  20 mg Oral QHS  . [START  ON 08/16/2014] vancomycin  750 mg Intravenous Q48H    Continuous Infusions:   Past Medical History  Diagnosis Date  . Hypertension   . Thyroid disease   . Abnormal LFTs     Fatty liver by [place]  . Hypercholesterolemia   . Carpal tunnel syndrome   . DJD (degenerative joint disease)   . Shingles   . Meralgia paraesthetica   . GERD (gastroesophageal reflux disease)   . Pulmonary fibrosis   . Hemorrhoids   . Diverticulosis   . Normocytic normochromic anemia   . History of recurrent TIAs   . UTI (lower urinary tract infection)   . Chronic renal disease, stage 4, severely decreased glomerular filtration rate between 15-29 mL/min/1.73 square meter   . Anemia of chronic kidney failure   . Diabetes mellitus without complication     Type 2  . CHF (congestive heart failure)     pt not aware of this  . Bipolar disorder     in the past  . Constipation   . Macular degeneration   . Seizures     questionably had when she had TIA's, takes Primodone, states it has helped with the "shaking"  . Complication of anesthesia     trouble with putting her to sleep - 12/15 - pt doesn't remember this    Past Surgical History  Procedure Laterality Date  . Back surgery      X 2 - lumbar  . Tonsillectomy    . Cataract extraction, bilateral    . Breast biopsy  B/L- said to be negative for malignancy.  . Colonoscopy    . Colonoscopy N/A 08/08/2013    Procedure: COLONOSCOPY;  Surgeon: [name], MD;  Location: [facility];  Service: Endoscopy;  Laterality: N/A;  EGD is possible  . Carpal tunnel release Left   . Eye surgery Bilateral     cataract surgery with lens implant  . Bascilic vein transposition Left 08/03/2014    Procedure:  INSERTION of Left Arm Gortex graft;  Surgeon: Angelia Mould, MD;  Location: [facility];  Service: Vascular;  Laterality: Left;    Laurette Schimke MS, RD, LDN

## 2014-08-15 NOTE — Evaluation (Signed)
Physical Therapy Evaluation Patient Details Name: Susan Mccoy MRN: [id number] DOB: 1932/07/22 Today's Date: 08/15/2014   History of Present Illness  Pt admitted 08/14/14 w/ c/o difficulty swallowing and has suspected esophegeal stricture.  Clinical Impression  Patient demonstrates deficits in functional mobility as indicated below. Will need continued skilled PT to address deficits and maximize function. Will see as indicated and progress as tolerated.    Follow Up Recommendations Home health PT;Supervision/Assistance - 24 hour    Equipment Recommendations  None recommended by PT    Recommendations for Other Services       Precautions / Restrictions Precautions Precautions: Fall Precaution Comments: be careful to avoid LUE secondary to graft on 08/03/14. Restrictions Weight Bearing Restrictions: No      Mobility  Bed Mobility Overal bed mobility: Needs Assistance Bed Mobility: Supine to Sit;Sit to Supine     Supine to sit: Min assist Sit to supine: Mod assist   General bed mobility comments: be careful to avoid LUE secondary to graft on 08/03/14.  Transfers Overall transfer level: Needs assistance Equipment used: Rolling walker (2 wheeled) (has 4 wheeled walker at home as well) Transfers: Sit to/from Stand Sit to Stand: Min assist;Mod assist         General transfer comment: Patient assisted with elevation to standing, VCs for hand placement and posterior support, Cues to lean forward  Ambulation/Gait Ambulation/Gait assistance: Min guard;Min assist Ambulation Distance (Feet): 90 Feet Assistive device: Rolling walker (2 wheeled) Gait Pattern/deviations: Step-through pattern;Decreased stride length Gait velocity: decreased Gait velocity interpretation: Below normal speed for age/gender    Stairs            Wheelchair Mobility    Modified Rankin (Stroke Patients Only)       Balance Overall balance assessment: Needs assistance   Sitting  balance-Leahy Scale: Poor Sitting balance - Comments: min assist for posterior support, cues to lean forward Postural control: Posterior lean Standing balance support: Bilateral upper extremity supported Standing balance-Leahy Scale: Poor Standing balance comment:  (initial posterior lean, but able to correct with cues)                             Pertinent Vitals/Pain Pain Assessment: 0-10 Pain Score: 4  Pain Descriptors / Indicators: Sore    Home Living Family/patient expects to be discharged to:: Private residence Living Arrangements: Spouse/significant other Available Help at Discharge: Personal care attendant ([facility] 8-12 and 3-8 pm) Type of Home: House Home Access: Stairs to enter Entrance Stairs-Rails: Left Entrance Stairs-Number of Steps: 3 Home Layout: One level Home Equipment: Walker - 2 wheels;Shower seat;Grab bars - toilet;Hand held shower head      Prior Function Level of Independence: Needs assistance   Gait / Transfers Assistance Needed: Ambulates w/ RW  ADL's / Homemaking Assistance Needed: Personal care attendant assists w/ homemaking and meal prep, bathing and dressing.        Hand Dominance   Dominant Hand: Right    Extremity/Trunk Assessment           LUE Deficits / Details: LUE arm graft 08/03/14); restricted use   Lower Extremity Assessment: Generalized weakness         Communication   Communication: No difficulties  Cognition Arousal/Alertness: Awake/alert Behavior During Therapy: WFL for tasks assessed/performed Overall Cognitive Status: History of cognitive impairments - at baseline       Memory: Decreased short-term memory;Decreased recall of precautions  General Comments      Exercises        Assessment/Plan    PT Assessment Patient needs continued PT services  PT Diagnosis Difficulty walking;Generalized weakness   PT Problem List    PT Treatment Interventions DME  instruction;Gait training;Functional mobility training;Therapeutic activities;Therapeutic exercise;Patient/family education   PT Goals (Current goals can be found in the Care Plan section) Acute Rehab PT Goals Patient Stated Goal: "Get this fixed" pt wants to eat and have f/u w/ MD PT Goal Formulation: With patient Time For Goal Achievement: 08/29/14 Potential to Achieve Goals: Good    Frequency Min 3X/week   Barriers to discharge        Co-evaluation               End of Session Equipment Utilized During Treatment: Gait belt Activity Tolerance: Patient tolerated treatment well Patient left: in bed;with call bell/phone within reach;with bed alarm set Nurse Communication: Mobility status         Time: 2952-8413 PT Time Calculation (min) (ACUTE ONLY): 27 min   Charges:   PT Evaluation $Initial PT Evaluation Tier I: 1 Procedure PT Treatments $Gait Training: 8-22 mins $Therapeutic Activity: 8-22 mins   PT G CodesDuncan Dull Aug 30, 2014, 4:28 PM Alben Deeds, [place] DPT  [phone]

## 2014-08-16 ENCOUNTER — Encounter (HOSPITAL_COMMUNITY): Payer: Self-pay | Admitting: *Deleted

## 2014-08-16 ENCOUNTER — Encounter (HOSPITAL_COMMUNITY)
Admission: EM | Disposition: A | Discharge status: Skilled Nursing Facility | Payer: Self-pay | Source: Home / Self Care | Attending: Internal Medicine

## 2014-08-16 ENCOUNTER — Inpatient Hospital Stay (HOSPITAL_COMMUNITY): Payer: Medicare Other

## 2014-08-16 ENCOUNTER — Inpatient Hospital Stay (HOSPITAL_COMMUNITY): Payer: Medicare Other | Admitting: Anesthesiology

## 2014-08-16 DIAGNOSIS — E11649 Type 2 diabetes mellitus with hypoglycemia without coma: Secondary | ICD-10-CM

## 2014-08-16 DIAGNOSIS — I10 Essential (primary) hypertension: Secondary | ICD-10-CM

## 2014-08-16 HISTORY — PX: ESOPHAGOGASTRODUODENOSCOPY (EGD) WITH PROPOFOL: SHX5813

## 2014-08-16 LAB — URINE CULTURE

## 2014-08-16 LAB — GLUCOSE, CAPILLARY
GLUCOSE-CAPILLARY: 67 mg/dL — AB (ref 70–99)
GLUCOSE-CAPILLARY: 148 mg/dL — AB (ref 70–99)
Glucose-Capillary: 128 mg/dL — ABNORMAL HIGH (ref 70–99)
Glucose-Capillary: 217 mg/dL — ABNORMAL HIGH (ref 70–99)
Glucose-Capillary: 120 mg/dL — ABNORMAL HIGH (ref 70–99)
Glucose-Capillary: 159 mg/dL — ABNORMAL HIGH (ref 70–99)
Glucose-Capillary: 71 mg/dL (ref 70–99)

## 2014-08-16 LAB — TROPONIN I: Troponin I: <0.03 ng/mL (ref <0.031)

## 2014-08-16 LAB — CBC
HCT: 28.9 % — ABNORMAL LOW (ref 36.0–46.0)
HEMOGLOBIN: 9.3 g/dL — AB (ref 12.0–15.0)
MCH: 32.5 pg (ref 26.0–34.0)
MCHC: 32.2 g/dL (ref 30.0–36.0)
MCV: 101 fL — ABNORMAL HIGH (ref 78.0–100.0)
Platelets: 204 10*3/uL (ref 150–400)
RBC: 2.86 MIL/uL — ABNORMAL LOW (ref 3.87–5.11)
RDW: 14.4 % (ref 11.5–15.5)
WBC: 9.4 10*3/uL (ref 4.0–10.5)

## 2014-08-16 LAB — LEGIONELLA ANTIGEN, URINE

## 2014-08-16 LAB — BASIC METABOLIC PANEL
Anion gap: 8 (ref 5–15)
BUN: 46 mg/dL — AB (ref 6–23)
CALCIUM: 8.3 mg/dL — AB (ref 8.4–10.5)
CO2: 23 mmol/L (ref 19–32)
Chloride: 104 mEq/L (ref 96–112)
Creatinine, Ser: 2.41 mg/dL — ABNORMAL HIGH (ref 0.50–1.10)
GFR, EST AFRICAN AMERICAN: 21 mL/min — AB (ref >90)
GFR, EST NON AFRICAN AMERICAN: 18 mL/min — AB (ref >90)
Glucose, Bld: 85 mg/dL (ref 70–99)
POTASSIUM: 4.6 mmol/L (ref 3.5–5.1)
Sodium: 135 mmol/L (ref 135–145)

## 2014-08-16 SURGERY — ESOPHAGOGASTRODUODENOSCOPY (EGD) WITH PROPOFOL
Anesthesia: Monitor Anesthesia Care

## 2014-08-16 MED ORDER — DEXTROSE 50 % IV SOLN
INTRAVENOUS | Status: AC
  Administered 2014-08-16: 50 mL
  Filled 2014-08-16: qty 50

## 2014-08-16 MED ORDER — LACTATED RINGERS IV SOLN
INTRAVENOUS | Status: DC | PRN
  Administered 2014-08-16: 10:00:00 via INTRAVENOUS

## 2014-08-16 MED ORDER — BUTAMBEN-TETRACAINE-BENZOCAINE 2-2-14 % EX AERO
INHALATION_SPRAY | CUTANEOUS | Status: DC | PRN
  Administered 2014-08-16: 2 via TOPICAL

## 2014-08-16 MED ORDER — PROPOFOL INFUSION 10 MG/ML OPTIME
INTRAVENOUS | Status: DC | PRN
  Administered 2014-08-16: 75 ug/kg/min via INTRAVENOUS

## 2014-08-16 MED ORDER — LIDOCAINE HCL (CARDIAC) 20 MG/ML IV SOLN
INTRAVENOUS | Status: DC | PRN
  Administered 2014-08-16: 50 mg via INTRAVENOUS

## 2014-08-16 MED ORDER — ACETAMINOPHEN 500 MG PO TABS: 1000.0000 mg | ORAL_TABLET | Freq: Three times a day (TID) | ORAL | Status: DC | PRN

## 2014-08-16 MED ORDER — MIDAZOLAM HCL 5 MG/5ML IJ SOLN
INTRAMUSCULAR | Status: DC | PRN
  Administered 2014-08-16 (×2): 1 mg via INTRAVENOUS

## 2014-08-16 NOTE — Progress Notes (Signed)
[facility] PROGRESS NOTE  TANESSA TIDD YPP:[id number] DOB: 24-Sep-1931 DOA: 08/14/2014 PCP: Henrine Screws, MD  Interim Summary Patient is a pleasant 81 year old female with a past medical history of esophageal strictures undergoing dilatation approximate 5 years ago was admitted to medicine service on 08/14/2014. She presented with complaints of difficulty swallowing as well as having some cough. Initial workup performed in the emergency department revealed a white count of 11,700. She had a chest x-ray which revealed densities in the left lower chest could be concerning for pneumonia or aspiration. She was started on empiric IV antimicrobial therapy for possible aspiration pneumonia. Meanwhile GI was consulted, patient underwent upper endoscopy on 08/16/2014.    Assessment/Plan: 1. Odynophagia/H/o GERD and esophageal stricture -Patient with history of esophageal strictures presenting with complaints of dysphagia. -She was seen and evaluated by GI, and EGD 1/13 showed mid esophageal ulceration due to pill-induced esophagitis which would account for her odynophagia. Although she has a Schatzki's ring, it appears to be widely patent. GI plans barium swallow with a tablet tomorrow. MBSS performed today and speech therapy recommends a regular diet and thin liquids.  2.  Possible aspiration pneumonia LLL pneumonia. -Chest x-ray performed in the emergency department revealed densities in the left lower lobe that could be secondary to aspiration pneumonia -Patient is being covered with empiric IV antimicrobial therapy with Zosyn. Will discontinue IV vancomycin -It is possible she may have aspirated from problem #1. Urine Legionella and pneumococcal antigen: Negative  3. Hypertension.  - Continue atenolol 25 mg by mouth daily. Amlodipine 5 MG daily added with improved control.  4.  Stage III chronic kidney disease -Patient with baseline creatinine near 2.2-2.4 - Stable.  5. Macrocytic  anemia  stable.  - Follow CBC in a.m.   6. Hypoglycemia in patient with DM 2 - Possibly from nothing by mouth status for procedure this morning. Improved and diet resumed. Monitor closely.  7. Escherichia coli UTI - Zosyn should cover  Code Status: Full code Family Communication: Spoke to her daughter & son at bedside Disposition Plan: Home when stable  Consultants:  GI  Procedures:  EGD on 08/16/2014  Antibiotics:  Zosyn  Subjective: Continues with some retrosternal pain on swallowing. Congested cough. Seen soon after EGD.  Objective: Filed Vitals:   08/16/14 1040 08/16/14 1050 08/16/14 1100 08/16/14 1401  BP: 163/60 148/52 153/59 147/58  Pulse: 88 88 82 84  Temp:   98.3 F (36.8 C) 98.2 F (36.8 C)  TempSrc:   Oral Oral  Resp: 23 21 20 20   Height:      Weight:      SpO2: 99% 100% 94% 100%     Intake/Output Summary (Last 24 hours) at 08/16/14 1622 Last data filed at 08/16/14 1403  Gross per 24 hour  Intake    720 ml  Output    650 ml  Net     70 ml   Filed Weights   08/14/14 2031 08/15/14 0525 08/16/14 0505  Weight: 57.2 kg (126 lb 1.7 oz) 57.2 kg (126 lb 1.7 oz) 58.378 kg (128 lb 11.2 oz)    Exam:   General:  Patient is awake, alert, no acute distress  Cardiovascular: Regular rate and rhythm normal S1-S2. Telemetry: Sinus rhythm  Respiratory: Clear to auscultation. No increased work of breathing.  Abdomen: Soft nontender nondistended  CNS: Alert and oriented. No focal deficits.  Data Reviewed: Basic Metabolic Panel:  Recent Labs Lab 08/14/14 1547 08/14/14 1751 08/15/14 0634 08/16/14 0120  NA  131* 135 134* 135  K 4.9 5.1 4.5 4.6  CL 101 99 104 104  CO2 25  --  24 23  GLUCOSE 239* 245* 63* 85  BUN 52* 50* 49* 46*  CREATININE 2.60* 2.30* 2.54* 2.41*  CALCIUM 8.9  --  8.4 8.3*   Liver Function Tests:  Recent Labs Lab 08/15/14 0634  AST 28  ALT 31  ALKPHOS 88  BILITOT 0.7  PROT 7.0  ALBUMIN 3.2*   No results for input(s):  LIPASE, AMYLASE in the last 168 hours. No results for input(s): AMMONIA in the last 168 hours. CBC:  Recent Labs Lab 08/14/14 1547 08/14/14 1751 08/15/14 0634 08/16/14 0120  WBC 19.5*  --  11.7* 9.4  NEUTROABS  --   --  6.1  --   HGB 11.4* 12.6 9.8* 9.3*  HCT 34.9* 37.0 30.5* 28.9*  MCV 99.7  --  98.1 101.0*  PLT 257  --  255 204   Cardiac Enzymes:  Recent Labs Lab 08/15/14 2014 08/16/14 0120 08/16/14 0727  TROPONINI <0.03 <0.03 <0.03   BNP (last 3 results) No results for input(s): PROBNP in the last 8760 hours. CBG:  Recent Labs Lab 08/15/14 2052 08/16/14 0613 08/16/14 0658 08/16/14 0911 08/16/14 1105  GLUCAP 128* 67* 217* 148* 120*    Recent Results (from the past 240 hour(s))  Urine culture     Status: None (Preliminary result)   Collection Time: 08/14/14  6:08 PM  Result Value Ref Range Status   Specimen Description URINE, RANDOM  Final   Special Requests NONE  Final   Colony Count   Final    >=100,000 COLONIES/ML Performed at [facility]    Culture   Final    ESCHERICHIA COLI Performed at [facility]    Report Status PENDING  Incomplete  Blood culture (routine x 2)     Status: None (Preliminary result)   Collection Time: 08/14/14  6:14 PM  Result Value Ref Range Status   Specimen Description BLOOD ARM RIGHT  Final   Special Requests BOTTLES DRAWN AEROBIC AND ANAEROBIC 5CC  Final   Culture   Final           BLOOD CULTURE RECEIVED NO GROWTH TO DATE CULTURE WILL BE HELD FOR 5 DAYS BEFORE ISSUING A FINAL NEGATIVE REPORT Performed at [facility]    Report Status PENDING  Incomplete  Blood culture (routine x 2)     Status: None (Preliminary result)   Collection Time: 08/14/14  6:22 PM  Result Value Ref Range Status   Specimen Description BLOOD RIGHT FOREARM  Final   Special Requests BOTTLES DRAWN AEROBIC AND ANAEROBIC 5CC  Final   Culture   Final           BLOOD CULTURE RECEIVED NO GROWTH TO DATE CULTURE WILL BE HELD FOR  5 DAYS BEFORE ISSUING A FINAL NEGATIVE REPORT Performed at [facility]    Report Status PENDING  Incomplete  Culture, blood (routine x 2) Call MD if unable to obtain prior to antibiotics being given     Status: None (Preliminary result)   Collection Time: 08/14/14  9:28 PM  Result Value Ref Range Status   Specimen Description BLOOD RIGHT ARM  Final   Special Requests BOTTLES DRAWN AEROBIC AND ANAEROBIC 10CC  Final   Culture   Final           BLOOD CULTURE RECEIVED NO GROWTH TO DATE CULTURE WILL BE HELD FOR 5 DAYS  BEFORE ISSUING A FINAL NEGATIVE REPORT Note: Culture results may be compromised due to an excessive volume of blood received in culture bottles. Performed at [facility]    Report Status PENDING  Incomplete  Culture, blood (routine x 2) Call MD if unable to obtain prior to antibiotics being given     Status: None (Preliminary result)   Collection Time: 08/14/14  9:35 PM  Result Value Ref Range Status   Specimen Description BLOOD RIGHT WRIST  Final   Special Requests BOTTLES DRAWN AEROBIC AND ANAEROBIC 5CC  Final   Culture   Final           BLOOD CULTURE RECEIVED NO GROWTH TO DATE CULTURE WILL BE HELD FOR 5 DAYS BEFORE ISSUING A FINAL NEGATIVE REPORT Performed at [facility]    Report Status PENDING  Incomplete     Studies: Dg Chest 2 View  08/14/2014   CLINICAL DATA:  Tightness in the esophagus with chest pain.  EXAM: CHEST  2 VIEW  COMPARISON:  08/07/2013  FINDINGS: There are patchy interstitial lung markings throughout both lungs. The left hemidiaphragm is obscured and there is concern for airspace disease in the left lower lobe. Cannot exclude fullness in the left hilar region. The aortic arch is heavily calcified. No large pleural effusions based on the lateral view. Difficult to assess the cardiac size.  IMPRESSION: Densities in the left lower chest that obscure the left hemidiaphragm. Findings are concerning for pneumonia or aspiration. Difficult  to exclude fullness in the left hilum. Recommend follow-up to ensure resolution.  Patchy interstitial densities in both lungs may represent a combination of chronic changes and low lung volumes.   Electronically Signed   By: Markus Daft M.D.   On: 08/14/2014 17:21    Scheduled Meds: . allopurinol  150 mg Oral Daily  . amLODipine  5 mg Oral Daily  . antiseptic oral rinse  7 mL Mouth Rinse BID  . aspirin EC  81 mg Oral Daily  . atenolol  25 mg Oral Daily  . beta carotene w/minerals  1 tablet Oral Daily  . calcitRIOL  0.25 mcg Oral QODAY  . cholecalciferol  1,000 Units Oral Daily  . cyclobenzaprine  5 mg Oral QHS  . docusate sodium  100 mg Oral BID  . ferrous sulfate  325 mg Oral Q breakfast  . heparin  5,000 Units Subcutaneous 3 times per day  . insulin aspart  0-9 Units Subcutaneous TID WC  . pantoprazole  20 mg Oral Daily  . piperacillin-tazobactam (ZOSYN)  IV  2.25 g Intravenous 3 times per day  . primidone  50 mg Oral BID  . simvastatin  20 mg Oral QHS   Continuous Infusions: . sodium chloride 75 mL/hr at 08/16/14 1242    Principal Problem:   Difficulty swallowing Active Problems:   Diabetes mellitus without complication   HYPERCHOLESTEROLEMIA   Anemia of chronic kidney failure   Hypertension   GERD (gastroesophageal reflux disease)   CHF (congestive heart failure)   Chronic renal disease, stage 4, severely decreased glomerular filtration rate between 15-29 mL/min/1.73 square meter   Aspiration pneumonia   History of transient ischemic attack (TIA)    Time spent: 35 min   Deborrah Mabin, MD, FACP, FHM. [facility] Pager [phone]  If 7PM-7AM, please contact night-coverage [email or web address] Password [email or web address] 08/16/2014, 4:38 PM    LOS: 2 days

## 2014-08-16 NOTE — Anesthesia Preprocedure Evaluation (Addendum)
Anesthesia Evaluation  Patient identified by MRN, date of birth, ID band Patient awake    Reviewed: Allergy & Precautions, NPO status   History of Anesthesia Complications Negative for: history of anesthetic complications  Airway Mallampati: I  TM Distance: >3 FB Neck ROM: Full    Dental  (+) Edentulous Upper, Edentulous Lower   Pulmonary former smoker,    Pulmonary exam normal       Cardiovascular hypertension, +CHF     Neuro/Psych Seizures -,  PSYCHIATRIC DISORDERS Depression Bipolar Disorder    GI/Hepatic Neg liver ROS, GERD-  ,  Endo/Other  diabetes  Renal/GU Renal InsufficiencyRenal disease     Musculoskeletal   Abdominal   Peds  Hematology   Anesthesia Other Findings   Reproductive/Obstetrics                            Anesthesia Physical Anesthesia Plan  ASA: III  Anesthesia Plan: MAC   Post-op Pain Management:    Induction: Intravenous  Airway Management Planned: Simple Face Mask  Additional Equipment:   Intra-op Plan:   Post-operative Plan: Extubation in OR  Informed Consent: I have reviewed the patients History and Physical, chart, labs and discussed the procedure including the risks, benefits and alternatives for the proposed anesthesia with the patient or authorized representative who has indicated his/her understanding and acceptance.     Plan Discussed with: CRNA, Anesthesiologist and Surgeon  Anesthesia Plan Comments:        Anesthesia Quick Evaluation

## 2014-08-16 NOTE — Op Note (Signed)
[facility] [place], [id number]   ENDOSCOPY PROCEDURE REPORT  PATIENT: Susan Mccoy, Susan Mccoy  MR#: [id number] BIRTHDATE: February 25, 1932 , 62  yrs. old GENDER: female ENDOSCOPIST:Shonica Weier, MD REFERRED BY: R. Marcellus Scott, MD PROCEDURE DATE:  August 28, 2014 PROCEDURE:   upper endoscopy with biopsy ASA CLASS:    III INDICATIONS: swallowing difficulties, which appeared to be more related to odynophagia than dysphagia MEDICATION: MAC TOPICAL ANESTHETIC:   Cetacaine spray  DESCRIPTION OF PROCEDURE:   After the risks and benefits of the procedure were explained, informed consent was obtained. the patient was brought from her hospital room to the [facility] cone endoscopy unit. The Pentax Gastroscope [id number]  endoscope was introduced through the mouth  and advanced to the second portion of the duodenum .  The instrument was slowly withdrawn as the mucosa was fully examined.    The esophagus was entered without difficulty under direct vision, but the vocal cords were not well seen.  the esophagus was pertinent for a mid esophageal ulceration measuring approximately 1 x 2 cm. This was above a mild ring in the mid esophageal region, and appeared to be consistent with pill-induced esophageal mucosal ulceration. There was no mass effect or evidence of neoplasia. Several biopsies were obtained from this area, however.  There was no evidence of reflux esophagitis, Barrett's esophagus, infection, neoplasia, or varices. However, there was a well-defined, widely patent Schatzki's ring at the squamocolumnar junction, below which was a 2 cm hiatal hernia. The Schatzki's ring did not appear to be causing obstruction. The 10 mm endoscope passed easily through it and I would estimate its aperture was on the order of 13-15 mm.  the stomach was entered. It contained no residual. No gastritis, erosions, ulcers, polyps, or masses were seen.  The pylorus, duodenal  bulb, and second duodenum looked normal.  Retroflexion showed a normal cardia and fundus.  The scope was then withdrawn from the patient and the procedure completed.  COMPLICATIONS: There were no immediate complications.  ENDOSCOPIC IMPRESSION: 1. Mid esophageal ulceration, suggestive of pill induced stasis esophagitis, which would readily, for her odynophagia symptoms . 2. Widely patent Schatzki's ring which may or may not be causing any intermittent dysphagia symptoms. 3.Small hiatal hernia  RECOMMENDATIONS: 1. Await pathology on esophageal biopsies 2. No specific treatment for the esophageal ulceration is necessary. In extreme cases, viscous lidocaine or trials of sucralfate suspension could be administered, but I don't think that her symptoms are sufficient to necessitate these interventions, which tend not to be all that effective anyway 3. Modified barium swallow in view of concerns of aspiration pneumonia, to evaluate for possible oropharyngeal dysfunction 4. Barium swallow with tablet tomorrow, to assess for functional significance of her Schatzki's ring and to look for evidence of esophageal dysmotility   _______________________________ eSigned:  Ronald Lobo, MD 08-28-2014 10:40 AM     cc:  CPT CODES: ICD CODES:  The ICD and CPT codes recommended by this software are interpretations from the data that the clinical staff has captured with the software.  The verification of the translation of this report to the ICD and CPT codes and modifiers is the sole responsibility of the health care institution and practicing physician where this report was generated.  [employer] will not be held responsible for the validity of the ICD and CPT codes included on this report.  AMA assumes no liability for data contained or not contained herein. CPT is a Designer, television/film set of the  American Medical Association.  PATIENT NAME:  Susan Mccoy, Susan Mccoy MR#:  [id number]

## 2014-08-16 NOTE — Procedures (Signed)
Objective Swallowing Evaluation: Modified Barium Swallowing Study  Patient Details  Name: Susan Mccoy MRN: [id number] Date of Birth: May 12, 1932  Today's Date: 08/16/2014 Time: 1300-1315 SLP Time Calculation (min) (ACUTE ONLY): 15 min  Past Medical History:  Past Medical History  Diagnosis Date  . Hypertension   . Thyroid disease   . Abnormal LFTs     Fatty liver by [place]  . Hypercholesterolemia   . Carpal tunnel syndrome   . DJD (degenerative joint disease)   . Shingles   . Meralgia paraesthetica   . GERD (gastroesophageal reflux disease)   . Pulmonary fibrosis   . Hemorrhoids   . Diverticulosis   . Normocytic normochromic anemia   . History of recurrent TIAs   . UTI (lower urinary tract infection)   . Chronic renal disease, stage 4, severely decreased glomerular filtration rate between 15-29 mL/min/1.73 square meter   . Anemia of chronic kidney failure   . Diabetes mellitus without complication     Type 2  . CHF (congestive heart failure)     pt not aware of this  . Bipolar disorder     in the past  . Constipation   . Macular degeneration   . Seizures     questionably had when she had TIA's, takes Primodone, states it has helped with the "shaking"  . Complication of anesthesia     trouble with putting her to sleep - 12/15 - pt doesn't remember this   Past Surgical History:  Past Surgical History  Procedure Laterality Date  . Back surgery      X 2 - lumbar  . Tonsillectomy    . Cataract extraction, bilateral    . Breast biopsy  B/L- said to be negative for malignancy.  . Colonoscopy    . Colonoscopy N/A 08/08/2013    Procedure: COLONOSCOPY;  Surgeon: [name], MD;  Location: [facility];  Service: Endoscopy;  Laterality: N/A;  EGD is possible  . Carpal tunnel release Left   . Eye surgery Bilateral     cataract surgery with lens implant  . Bascilic vein transposition Left 08/03/2014    Procedure:  INSERTION of Left Arm Gortex graft;  Surgeon: Angelia Mould, MD;  Location: [facility] OR;  Service: Vascular;  Laterality: Left;   HPI:  81 year old female with PMH of esophageal strictures (dilatation appox five years ago) was admitted to medicine service on 08/14/2014. She presented with complaints of difficulty swallowing as well as having some cough. Initial workup performed in the emergency department revealed a white count of 11,700. She had a chest x-ray which revealed densities in the left lower chest could be concerning for pneumonia or aspiration.  Pt underwent endoscopy 08/16/14 revealing: "mid esophageal ulceration (presumably from pill induced esophagitis), which would readily account for her odynophagia; Schatzki's ring, but it appears to be widely patent and is of questionable relevance to any dysphagia symptoms."  Per discussion with Dr. Cristina Gong, plan is for Ba swallow next date; MBS this afternoon to r/o oropharygeal dysfunction.      Assessment / Plan / Recommendation Clinical Impression  Dysphagia Diagnosis: Within Functional Limits Clinical impression: Pt presents with a normal oropharyngeal swallow: there was active, effective mastication; strong bolus propulsion through pharynx and through UES with no residuals; reliable/consistent laryngeal closure with no penetration nor aspiration.  Screen of esophagus revealed no barium retention.  Doubt oropharyngeal impairment as etiology of aspiration pna.  Discussed results of study with pt and Dr. Cristina Gong.  Recommend  resuming a regular consistency diet, thin liquids    Treatment Recommendation  No treatment recommended at this time    Diet Recommendation Regular;Thin liquid   Liquid Administration via: Cup;Straw Medication Administration: Whole meds with liquid Supervision: Patient able to self feed Compensations: Follow solids with liquid Postural Changes and/or Swallow Maneuvers: Seated upright 90 degrees;Upright 30-60 min after meal    Other  Recommendations Oral Care Recommendations:  Oral care BID   Follow Up Recommendations  None        General Type of Study: Modified Barium Swallowing Study Diet Prior to this Study: NPO Temperature Spikes Noted: No Respiratory Status: Room air History of Recent Intubation: No Behavior/Cognition: Alert;Cooperative Oral Cavity - Dentition: Dentures, top;Dentures, bottom Self-Feeding Abilities: Able to feed self Patient Positioning: Upright in chair Baseline Vocal Quality: Clear Volitional Cough: Strong Volitional Swallow: Able to elicit Anatomy:  (appearance of probable osteophytes along cervical column) Pharyngeal Secretions: Not observed secondary MBS    Reason for Referral     Oral Phase Oral Preparation/Oral Phase Oral Phase: WFL   Pharyngeal Phase Pharyngeal Phase Pharyngeal Phase: Within functional limits  Cervical Esophageal Phase   Alegandra Sommers L. [place], [place] CCC/SLP Pager [phone]     Cervical Esophageal Phase Cervical Esophageal Phase: Darryll Capers         Juan Quam Laurice 08/16/2014, 1:46 PM

## 2014-08-16 NOTE — Anesthesia Postprocedure Evaluation (Signed)
Anesthesia Post Note  Patient: Susan Mccoy  Procedure(s) Performed: Procedure(s) (LRB): ESOPHAGOGASTRODUODENOSCOPY (EGD) WITH PROPOFOL (N/A)  Anesthesia type: MAC  Patient location: PACU  Post pain: Pain level controlled  Post assessment: Patient's Cardiovascular Status Stable  Last Vitals:  Filed Vitals:   08/16/14 1033  BP: 154/69  Pulse:   Temp:   Resp: 21    Post vital signs: Reviewed and stable  Level of consciousness: sedated  Complications: No apparent anesthesia complications

## 2014-08-16 NOTE — Transfer of Care (Signed)
Immediate Anesthesia Transfer of Care Note  Patient: Susan Mccoy  Procedure(s) Performed: Procedure(s): ESOPHAGOGASTRODUODENOSCOPY (EGD) WITH PROPOFOL (N/A)  Patient Location: PACU  Anesthesia Type:MAC  Level of Consciousness: awake, alert , oriented and patient cooperative  Airway & Oxygen Therapy: Patient Spontanous Breathing and Patient connected to face mask oxygen  Post-op Assessment: Report given to PACU RN and Post -op Vital signs reviewed and stable  Post vital signs: Reviewed and stable  Complications: No apparent anesthesia complications

## 2014-08-16 NOTE — Progress Notes (Signed)
Speech Language Pathology Treatment:    Patient Details Name: Susan Mccoy MRN: [id number] DOB: [birth year] Today's Date: 08/16/2014 Time: 1440-1453 SLP Time Calculation (min) (ACUTE ONLY): 13 min  Assessment / Plan / Recommendation Clinical Impression  Returned for education with pt's daughter re: results and recs from today's MBS.  Reviewed basic strategies to facilitate esophageal clearance.  Pt/daughter verbalize understanding.  No further SLP f/u needed.     HPI HPI: 81 year old female with PMH of esophageal strictures (dilatation appox five years ago) was admitted to medicine service on 08/14/2014. She presented with complaints of difficulty swallowing as well as having some cough. Initial workup performed in the emergency department revealed a white count of 11,700. She had a chest x-ray which revealed densities in the left lower chest could be concerning for pneumonia or aspiration.  Pt underwent endoscopy 08/16/14 revealing: "mid esophageal ulceration (presumably from pill induced esophagitis), which would readily account for her odynophagia; Schatzki's ring, but it appears to be widely patent and is of questionable relevance to any dysphagia symptoms."  Per discussion with Dr. Cristina Gong, plan is for Ba swallow next date; MBS this afternoon to r/o oropharygeal dysfunction.    Pertinent Vitals Pain Assessment: No/denies pain  SLP Plan     No SLP f/u warranted   Recommendations Medication Administration: Whole meds with liquid Supervision: Patient able to self feed Compensations: Follow solids with liquid Postural Changes and/or Swallow Maneuvers: Seated upright 90 degrees;Upright 30-60 min after meal   Regular diet, thin liquids.            Oral Care Recommendations: Oral care BID Follow up Recommendations: None    Susan Mccoy, [place] CCC/SLP Pager [phone]      Susan Mccoy 08/16/2014, 2:54 PM

## 2014-08-16 NOTE — Progress Notes (Signed)
Asked MD if he was aware of patient's crt level of 2.41 during progression. MD stated he was aware. No new orders at this time.

## 2014-08-16 NOTE — Progress Notes (Signed)
Today's endoscopy shows a mid esophageal ulceration (presumably from pill induced esophagitis), which would readily account for her odynophagia.   She does have a Schatzki's ring, but it appears to be widely patent and is of questionable relevance to any dysphagia symptoms, so I plan barium swallow with a tablet tomorrow to further elucidate the clinical significance of that finding.  In the meantime, we will proceed with a modified barium swallow today, to see if she has oropharyngeal dysfunction which might be placing her at risk for aspiration pneumonia.  Cleotis Nipper, M.D. [phone]

## 2014-08-16 NOTE — Progress Notes (Signed)
Speech Language Pathology  Patient Details Name: Susan Mccoy MRN: [id number] DOB: 18-Dec-1931 Today's Date: 08/16/2014 Time:  -      MBS scheduled for 1300 today    Cranford Mon.Ed [employer] [phone]

## 2014-08-16 NOTE — Clinical Documentation Improvement (Signed)
  Please clarify Stage CKD. Chart states multiple stages: ESRD, CKD stage 4, CKD stage 3. Patient has fistula. Bun/Cr: 1/12 49/2.54,  1/13 46/2.41, GFR: 1/12 17/19,  1/13 18/21.  . Document the stage of CKD --Chronic kidney disease, stage 1- GFR > OR = 90 --Chronic kidney disease, stage 2 (mild) - GFR 60-89 --Chronic kidney disease, stage 3 (moderate) - GFR 30-59 --Chronic kidney disease, stage 4 (severe) - GFR 15-29 --Chronic kidney disease, stage 5- GFR < 15 --End-stage renal disease (ESRD) . Document any underlying cause of CKD such as Diabetes or Hypertension . Chronic renal failure without a documented stage will be assigned to Chronic kidney disease, unspecified  Thank You,

## 2014-08-17 ENCOUNTER — Inpatient Hospital Stay (HOSPITAL_COMMUNITY): Payer: Medicare Other

## 2014-08-17 ENCOUNTER — Encounter (HOSPITAL_COMMUNITY): Payer: Self-pay | Admitting: Gastroenterology

## 2014-08-17 LAB — BASIC METABOLIC PANEL
Anion gap: 12 (ref 5–15)
BUN: 37 mg/dL — ABNORMAL HIGH (ref 6–23)
CHLORIDE: 106 meq/L (ref 96–112)
CO2: 23 mmol/L (ref 19–32)
CREATININE: 2.39 mg/dL — AB (ref 0.50–1.10)
Calcium: 8.5 mg/dL (ref 8.4–10.5)
GFR calc Af Amer: 21 mL/min — ABNORMAL LOW (ref >90)
GFR calc non Af Amer: 18 mL/min — ABNORMAL LOW (ref >90)
Glucose, Bld: 100 mg/dL — ABNORMAL HIGH (ref 70–99)
Potassium: 4.8 mmol/L (ref 3.5–5.1)
Sodium: 141 mmol/L (ref 135–145)

## 2014-08-17 LAB — CBC
HCT: 27.1 % — ABNORMAL LOW (ref 36.0–46.0)
Hemoglobin: 8.9 g/dL — ABNORMAL LOW (ref 12.0–15.0)
MCH: 32.4 pg (ref 26.0–34.0)
MCHC: 32.8 g/dL (ref 30.0–36.0)
MCV: 98.5 fL (ref 78.0–100.0)
Platelets: 217 10*3/uL (ref 150–400)
RBC: 2.75 MIL/uL — AB (ref 3.87–5.11)
RDW: 14.4 % (ref 11.5–15.5)
WBC: 9.5 10*3/uL (ref 4.0–10.5)

## 2014-08-17 LAB — GLUCOSE, CAPILLARY
GLUCOSE-CAPILLARY: 108 mg/dL — AB (ref 70–99)
GLUCOSE-CAPILLARY: 201 mg/dL — AB (ref 70–99)
Glucose-Capillary: 111 mg/dL — ABNORMAL HIGH (ref 70–99)
Glucose-Capillary: 175 mg/dL — ABNORMAL HIGH (ref 70–99)
Glucose-Capillary: 72 mg/dL (ref 70–99)

## 2014-08-17 NOTE — Progress Notes (Signed)
Subjective: Less odynophagia.  Objective: Vital signs in last 24 hours: Temp:  [98.1 F (36.7 C)-98.2 F (36.8 C)] 98.2 F (36.8 C) (01/14 0640) Pulse Rate:  [73-84] 79 (01/14 1010) Resp:  [18-20] 18 (01/14 0640) BP: (147-178)/(49-58) 148/49 mmHg (01/14 1010) SpO2:  [95 %-100 %] 95 % (01/14 0640) Weight:  [59.9 kg (132 lb 0.9 oz)] 59.9 kg (132 lb 0.9 oz) (01/14 0640) Weight change: 1.522 kg (3 lb 5.7 oz) Last BM Date: 08/12/14  PE: GEN:  NAD  Lab Results: CBC    Component Value Date/Time   WBC 9.5 08/17/2014 0439   RBC 2.75* 08/17/2014 0439   HGB 8.9* 08/17/2014 0439   HCT 27.1* 08/17/2014 0439   PLT 217 08/17/2014 0439   MCV 98.5 08/17/2014 0439   MCH 32.4 08/17/2014 0439   MCHC 32.8 08/17/2014 0439   RDW 14.4 08/17/2014 0439   LYMPHSABS 4.3* 08/15/2014 0634   MONOABS 1.0 08/15/2014 0634   EOSABS 0.4 08/15/2014 0634   BASOSABS 0.1 08/15/2014 0634   CMP     Component Value Date/Time   NA 141 08/17/2014 0439   K 4.8 08/17/2014 0439   CL 106 08/17/2014 0439   CO2 23 08/17/2014 0439   GLUCOSE 100* 08/17/2014 0439   BUN 37* 08/17/2014 0439   CREATININE 2.39* 08/17/2014 0439   CALCIUM 8.5 08/17/2014 0439   PROT 7.0 08/15/2014 0634   ALBUMIN 3.2* 08/15/2014 0634   AST 28 08/15/2014 0634   ALT 31 08/15/2014 0634   ALKPHOS 88 08/15/2014 0634   BILITOT 0.7 08/15/2014 0634   GFRNONAA 18* 08/17/2014 0439   GFRAA 21* 08/17/2014 0439   Assessment:  1.  Odynophagia.  Likely due to pill esophagitis.  Improving. 2.  Question of dysphagia.  Schatzki's ring, widely patent, seen on recent endoscopy.  Plan:  1.  Barium swallow today, with tablet, to assess significance of yesterday's Schatzki's ring. 2.  Will follow.   Landry Dyke 08/17/2014, 1:09 PM

## 2014-08-17 NOTE — Progress Notes (Signed)
[facility] PROGRESS NOTE  Susan Mccoy QZR:[id number] DOB: 11/15/1931 DOA: 08/14/2014 PCP: Henrine Screws, MD  Interim Summary Patient is a pleasant 81 year old female with a past medical history of esophageal strictures undergoing dilatation approximate 5 years ago was admitted to medicine service on 08/14/2014. She presented with complaints of difficulty swallowing as well as having some cough. Initial workup performed in the emergency department revealed a white count of 11,700. She had a chest x-ray which revealed densities in the left lower chest could be concerning for pneumonia or aspiration. She was started on empiric IV antimicrobial therapy for possible aspiration pneumonia. Meanwhile GI was consulted, patient underwent upper endoscopy on 08/16/2014.    Assessment/Plan: 1. Odynophagia/H/o GERD and esophageal stricture -Patient with history of esophageal strictures presenting with complaints of dysphagia. -She was seen and evaluated by GI, and EGD 1/13 showed mid esophageal ulceration due to pill-induced esophagitis which would account for her odynophagia. Although she has a Schatzki's ring, it appears to be widely patent. GI plans barium swallow with a tablet 1/14. MBSS performed 1/13 and speech therapy recommended a regular diet and thin liquids-tolerating same. - barium swallow with pill today: Transient delay in pill passing and age-related esophageal dysmotility.  2.  Possible aspiration pneumonia LLL pneumonia. -Chest x-ray performed in the emergency department revealed densities in the left lower lobe that could be secondary to aspiration pneumonia -Patient is being covered with empiric IV antimicrobial therapy with Zosyn. Will discontinue IV vancomycin -It is possible she may have aspirated from problem #1. Urine Legionella and pneumococcal antigen: Negative - Switched to oral antibiotics at discharge in a.m.  3. Hypertension.  - Continue atenolol 25 mg by mouth  daily. Amlodipine 5 MG daily added with improved control. May need to adjust medications.  4.  Stage III chronic kidney disease -Patient with baseline creatinine near 2.2-2.4 - Stable. - At family's request, requested nephrology to discuss with them regarding renal status/care. Dr. Web has kindly agreed to speak with family 1/15.  5. Macrocytic anemia  stable.  - Reasonably stable. Will follow CBC in am.   6. Hypoglycemia in patient with DM 2 - Possibly from nothing by mouth status for procedure 1/13 morning. Improved and diet resumed. Monitor closely.  7. Escherichia coli UTI - Zosyn should cover  Code Status: Full code Family Communication: Spoke to her daughter/HCPOA at bedside in detail. Updated care and answered questions. Disposition Plan: SNF possibly 1/15  Consultants:  GI  Procedures:  EGD on 08/16/2014  Antibiotics:  Zosyn  Subjective: Tolerating diet. Some retrosternal pain on swallowing. Denies cough or dyspnea. As per daughter, ongoing issues with memory loss.  Objective: Filed Vitals:   08/16/14 1401 08/16/14 2110 08/17/14 0640 08/17/14 1010  BP: 147/58 161/58 178/54 148/49  Pulse: 84 73 78 79  Temp: 98.2 F (36.8 C) 98.1 F (36.7 C) 98.2 F (36.8 C)   TempSrc: Oral Oral Oral   Resp: 20 20 18    Height:      Weight:   59.9 kg (132 lb 0.9 oz)   SpO2: 100% 100% 95%      Intake/Output Summary (Last 24 hours) at 08/17/14 1524 Last data filed at 08/17/14 1438  Gross per 24 hour  Intake 1425.75 ml  Output   1200 ml  Net 225.75 ml   Filed Weights   08/15/14 0525 08/16/14 0505 08/17/14 0640  Weight: 57.2 kg (126 lb 1.7 oz) 58.378 kg (128 lb 11.2 oz) 59.9 kg (132 lb 0.9 oz)  Exam:   General:  Patient is awake, alert, no acute distress. Attempting to ambulate with PT this morning.  Cardiovascular: Regular rate and rhythm normal S1-S2. Telemetry: Sinus rhythm  Respiratory: Clear to auscultation. No increased work of breathing.  Abdomen:  Soft nontender nondistended  CNS: Alert and oriented. No focal deficits.  Data Reviewed: Basic Metabolic Panel:  Recent Labs Lab 08/14/14 1547 08/14/14 1751 08/15/14 0634 08/16/14 0120 08/17/14 0439  NA 131* 135 134* 135 141  K 4.9 5.1 4.5 4.6 4.8  CL 101 99 104 104 106  CO2 25  --  24 23 23   GLUCOSE 239* 245* 63* 85 100*  BUN 52* 50* 49* 46* 37*  CREATININE 2.60* 2.30* 2.54* 2.41* 2.39*  CALCIUM 8.9  --  8.4 8.3* 8.5   Liver Function Tests:  Recent Labs Lab 08/15/14 0634  AST 28  ALT 31  ALKPHOS 88  BILITOT 0.7  PROT 7.0  ALBUMIN 3.2*   No results for input(s): LIPASE, AMYLASE in the last 168 hours. No results for input(s): AMMONIA in the last 168 hours. CBC:  Recent Labs Lab 08/14/14 1547 08/14/14 1751 08/15/14 0634 08/16/14 0120 08/17/14 0439  WBC 19.5*  --  11.7* 9.4 9.5  NEUTROABS  --   --  6.1  --   --   HGB 11.4* 12.6 9.8* 9.3* 8.9*  HCT 34.9* 37.0 30.5* 28.9* 27.1*  MCV 99.7  --  98.1 101.0* 98.5  PLT 257  --  255 204 217   Cardiac Enzymes:  Recent Labs Lab 08/15/14 2014 08/16/14 0120 08/16/14 0727  TROPONINI <0.03 <0.03 <0.03   BNP (last 3 results) No results for input(s): PROBNP in the last 8760 hours. CBG:  Recent Labs Lab 08/16/14 1613 08/16/14 2109 08/17/14 0322 08/17/14 0648 08/17/14 1055  GLUCAP 159* 71 111* 108* 175*    Recent Results (from the past 240 hour(s))  Urine culture     Status: None   Collection Time: 08/14/14  6:08 PM  Result Value Ref Range Status   Specimen Description URINE, RANDOM  Final   Special Requests NONE  Final   Colony Count   Final    >=100,000 COLONIES/ML Performed at [facility]   Final    ESCHERICHIA COLI Performed at [facility]    Report Status 08/16/2014 FINAL  Final   Organism ID, Bacteria ESCHERICHIA COLI  Final      Susceptibility   Escherichia coli - MIC*    AMPICILLIN <=2 SENSITIVE Sensitive     CEFAZOLIN <=4 SENSITIVE Sensitive      CEFTRIAXONE <=1 SENSITIVE Sensitive     CIPROFLOXACIN <=0.25 SENSITIVE Sensitive     GENTAMICIN <=1 SENSITIVE Sensitive     LEVOFLOXACIN <=0.12 SENSITIVE Sensitive     NITROFURANTOIN <=16 SENSITIVE Sensitive     TOBRAMYCIN <=1 SENSITIVE Sensitive     TRIMETH/SULFA <=20 SENSITIVE Sensitive     PIP/TAZO <=4 SENSITIVE Sensitive     * ESCHERICHIA COLI  Blood culture (routine x 2)     Status: None (Preliminary result)   Collection Time: 08/14/14  6:14 PM  Result Value Ref Range Status   Specimen Description BLOOD ARM RIGHT  Final   Special Requests BOTTLES DRAWN AEROBIC AND ANAEROBIC 5CC  Final   Culture   Final           BLOOD CULTURE RECEIVED NO GROWTH TO DATE CULTURE WILL BE HELD FOR 5 DAYS BEFORE ISSUING A FINAL NEGATIVE REPORT Performed at  [facility]    Report Status PENDING  Incomplete  Blood culture (routine x 2)     Status: None (Preliminary result)   Collection Time: 08/14/14  6:22 PM  Result Value Ref Range Status   Specimen Description BLOOD RIGHT FOREARM  Final   Special Requests BOTTLES DRAWN AEROBIC AND ANAEROBIC 5CC  Final   Culture   Final           BLOOD CULTURE RECEIVED NO GROWTH TO DATE CULTURE WILL BE HELD FOR 5 DAYS BEFORE ISSUING A FINAL NEGATIVE REPORT Performed at [facility]    Report Status PENDING  Incomplete  Culture, blood (routine x 2) Call MD if unable to obtain prior to antibiotics being given     Status: None (Preliminary result)   Collection Time: 08/14/14  9:28 PM  Result Value Ref Range Status   Specimen Description BLOOD RIGHT ARM  Final   Special Requests BOTTLES DRAWN AEROBIC AND ANAEROBIC 10CC  Final   Culture   Final           BLOOD CULTURE RECEIVED NO GROWTH TO DATE CULTURE WILL BE HELD FOR 5 DAYS BEFORE ISSUING A FINAL NEGATIVE REPORT Note: Culture results may be compromised due to an excessive volume of blood received in culture bottles. Performed at [facility]    Report Status PENDING  Incomplete  Culture,  blood (routine x 2) Call MD if unable to obtain prior to antibiotics being given     Status: None (Preliminary result)   Collection Time: 08/14/14  9:35 PM  Result Value Ref Range Status   Specimen Description BLOOD RIGHT WRIST  Final   Special Requests BOTTLES DRAWN AEROBIC AND ANAEROBIC 5CC  Final   Culture   Final           BLOOD CULTURE RECEIVED NO GROWTH TO DATE CULTURE WILL BE HELD FOR 5 DAYS BEFORE ISSUING A FINAL NEGATIVE REPORT Performed at [facility]    Report Status PENDING  Incomplete     Studies: Dg Esophagus  08/17/2014   CLINICAL DATA:  Distal esophageal dysphagia for solids and pills  EXAM: ESOPHOGRAM/BARIUM SWALLOW  TECHNIQUE: Single contrast examination was performed using thin barium. 12.5 mm diameter barium tablet was also given.  FLUOROSCOPY TIME:  2 min 30 seconds  COMPARISON:  None  FINDINGS: Significant diffuse impairment of esophageal motility.  No gross evidence of esophageal mass or stricture.  Small outpouching of contrast was identified at the distal esophagus.  Initially I believed this to be a small epiphrenic diverticulum.  However, review of the entirety of the exam shows this is not persistent and likely represents a small sliding hiatal hernia with intermittent retention of contrast within the hernia sac above the diaphragm.  12.5 mm diameter barium tablet transiently delayed within this but then passed beyond following multiple swallows of thin barium and water.  No definite persistent intraluminal filling defects.  As examination was performed horizontal LPO/RPO and patient has no cervical esophageal symptoms, dedicated assessment of oropharyngeal phase of swallowing was not performed.  Atherosclerotic calcifications of the aorta noted.  Bones appear demineralized.  IMPRESSION: Small sliding hiatal hernia with transient delay of the 12.5 mm diameter barium tablet within the hernia sac above the diaphragm, initially passing into the stomach with additional  swallows of thin barium and water.  Significant diffuse age-related esophageal dysmotility.  No evidence of esophageal mass or stricture.   Electronically Signed   By: Crist Infante.D.  On: 08/17/2014 13:50    Scheduled Meds: . allopurinol  150 mg Oral Daily  . amLODipine  5 mg Oral Daily  . antiseptic oral rinse  7 mL Mouth Rinse BID  . aspirin EC  81 mg Oral Daily  . atenolol  25 mg Oral Daily  . beta carotene w/minerals  1 tablet Oral Daily  . calcitRIOL  0.25 mcg Oral QODAY  . cholecalciferol  1,000 Units Oral Daily  . cyclobenzaprine  5 mg Oral QHS  . docusate sodium  100 mg Oral BID  . ferrous sulfate  325 mg Oral Q breakfast  . heparin  5,000 Units Subcutaneous 3 times per day  . insulin aspart  0-9 Units Subcutaneous TID WC  . pantoprazole  20 mg Oral Daily  . piperacillin-tazobactam (ZOSYN)  IV  2.25 g Intravenous 3 times per day  . primidone  50 mg Oral BID  . simvastatin  20 mg Oral QHS   Continuous Infusions: . sodium chloride 10 mL/hr at 08/16/14 1733    Principal Problem:   Difficulty swallowing Active Problems:   Diabetes mellitus without complication   HYPERCHOLESTEROLEMIA   Anemia of chronic kidney failure   Hypertension   GERD (gastroesophageal reflux disease)   CHF (congestive heart failure)   Chronic renal disease, stage 4, severely decreased glomerular filtration rate between 15-29 mL/min/1.73 square meter   Aspiration pneumonia   History of transient ischemic attack (TIA)    Time spent: 64 min   Shanik Brookshire, MD, FACP, FHM. [facility] Pager [phone]  If 7PM-7AM, please contact night-coverage [email or web address] Password [email or web address] 08/17/2014, 3:24 PM    LOS: 3 days

## 2014-08-17 NOTE — Progress Notes (Signed)
Physical Therapy Treatment Patient Details Name: Susan Mccoy MRN: [id number] DOB: 04-11-1932 Today's Date: 08/17/2014    History of Present Illness Pt admitted 08/14/14 w/ c/o difficulty swallowing and has suspected esophegeal stricture.    PT Comments    Patient progressing well with activity, ambulated in hall on 2 liters remained >90%.  Spoke with patient and daughter at length regarding safety with mobility and concerns for discharge. At this time, family is unable to provide 24/7 supervision/assist. Given patient overall deconditioning, increased risk of falls, and need for safety cueing, feel patient may benefit from [place] [facility] to improve mobility and function prior to returning home. Will continue to see as indicated and progress as tolerated.   Follow Up Recommendations  SNF;Supervision/Assistance - 24 hour (patient does not have supervision at home)     Equipment Recommendations  None recommended by PT    Recommendations for Other Services       Precautions / Restrictions Precautions Precautions: Fall Precaution Comments: be careful to avoid LUE secondary to graft on 08/03/14. Restrictions Weight Bearing Restrictions: No    Mobility  Bed Mobility Overal bed mobility: Needs Assistance Bed Mobility: Supine to Sit;Sit to Supine     Supine to sit: Min assist Sit to supine: Mod assist   General bed mobility comments: be careful to avoid LUE secondary to graft on 08/03/14.  Transfers Overall transfer level: Needs assistance Equipment used: Rolling walker (2 wheeled) (has 4 wheeled walker at home as well) Transfers: Sit to/from [facility] Sit to Stand: Min assist;Mod assist Stand pivot transfers: Min guard       General transfer comment: VCs for hand placement and technique, initial assist for stability, cues for positioning. Patient able to stand from [facility] without assist.  Ambulation/Gait Ambulation/Gait assistance: Min guard Ambulation Distance  (Feet): 120 Feet Assistive device: Rolling walker (2 wheeled) Gait Pattern/deviations: Step-through pattern;Decreased stride length Gait velocity: decreased Gait velocity interpretation: Below normal speed for age/gender General Gait Details: Steady with ambulation, cues for safety with use of RW and cadence   Stairs            Wheelchair Mobility    Modified Rankin (Stroke Patients Only)       Balance Overall balance assessment: Needs assistance Sitting-balance support: Feet supported;Single extremity supported Sitting balance-Leahy Scale: Fair     Standing balance support: Single extremity supported Standing balance-Leahy Scale: Fair Standing balance comment: improved dynamic standing this session, min guard                    Cognition Arousal/Alertness: Awake/alert Behavior During Therapy: WFL for tasks assessed/performed Overall Cognitive Status: History of cognitive impairments - at baseline       Memory: Decreased short-term memory;Decreased recall of precautions              Exercises      General Comments        Pertinent Vitals/Pain Pain Assessment: No/denies pain    Home Living                      Prior Function            PT Goals (current goals can now be found in the care plan section) Acute Rehab PT Goals Patient Stated Goal: "Feel better, I want to go home, when can i go home?" PT Goal Formulation: With patient Time For Goal Achievement: 08/29/14 Potential to Achieve Goals: Good Progress towards PT goals: Progressing toward  goals    Frequency  Min 3X/week    PT Plan Discharge plan needs to be updated    Co-evaluation             End of Session Equipment Utilized During Treatment: Gait belt Activity Tolerance: Patient tolerated treatment well Patient left: in chair;with call bell/phone within reach;with family/visitor present     Time: 1040-1107 PT Time Calculation (min) (ACUTE ONLY): 27  min  Charges:  $Gait Training: 8-22 mins $Self Care/Home Management: 8-22                    G CodesDuncan Dull 08/30/14, 11:16 AM Alben Deeds, PT DPT  [phone]

## 2014-08-17 NOTE — Progress Notes (Signed)
Occupational Therapy Treatment Patient Details Name: VEAR STATON MRN: [id number] DOB: 1932/02/08 Today's Date: 08/17/2014    History of present illness Pt admitted 08/14/14 w/ c/o difficulty swallowing and has suspected esophegeal stricture.   OT comments  Pt participated in ADL session today, requires less physical assist than initially, however fatigues easily and confusion cont to be limiting factor. Cont to follow for acute OT.   Follow Up Recommendations  Supervision/Assistance - 24 hour;Home health OT    Equipment Recommendations  None recommended by OT    Recommendations for Other Services      Precautions / Restrictions Precautions Precautions: Fall Precaution Comments: be careful to avoid LUE secondary to graft on 08/03/14. Restrictions Weight Bearing Restrictions: No       Mobility Bed Mobility Overal bed mobility: Needs Assistance Bed Mobility: Supine to Sit     Supine to sit: Min assist     General bed mobility comments: Avoid LUE secondary to graft on 08/03/14.  Transfers Overall transfer level: Needs assistance Equipment used: 1 person hand held assist Transfers: Sit to/from [facility] Sit to Stand: Min assist Stand pivot transfers: Min assist       General transfer comment: Pt requiring les sphysical assistance since last session, however fatigues easily and has fear of falls, 1 person hand held assist.    Balance Overall balance assessment: Needs assistance Sitting-balance support: Feet supported;Single extremity supported Sitting balance-Leahy Scale: Fair     Standing balance support: Single extremity supported Standing balance-Leahy Scale: Fair                     ADL Overall ADL's : Needs assistance/impaired     Grooming: Wash/dry hands;Wash/dry face;Sitting;Set up;Oral care (Apply make up) Grooming Details (indicate cue type and reason): Sitting up in chair Upper Body Bathing: Set up;Sitting;Min guard  (secondary to IV/lines)               Toilet Transfer: Minimal assistance;Stand-pivot (Pt declined today stating she had recently gone to bathroom. Simulated w/ transfers from EOB to recliner w/ Min A/hand held assist)   Toileting- Water quality scientist and Hygiene: Minimal assistance;Sit to/from stand       Functional mobility during ADLs: Minimal assistance General ADL Comments: Pt participated in ADL retraining session today for bed mobility, sitting at EOB for activity tolerance, transfers and brief static standing during ADL's and simulated toilet transfer (EOB to recliner w/ hand held assist, min A). Pt is confused and requires frequent repetetion and vc's, tc's.      Vision  Wears glasses at all times.                   Perception     Praxis      Cognition   Behavior During Therapy: WFL for tasks assessed/performed Overall Cognitive Status: History of cognitive impairments - at baseline       Memory: Decreased short-term memory;Decreased recall of precautions               Extremity/Trunk Assessment               Exercises     Shoulder Instructions       General Comments      Pertinent Vitals/ Pain       Pain Assessment: No/denies pain  Home Living  Prior Functioning/Environment              Frequency Min 2X/week     Progress Toward Goals  OT Goals(current goals can now be found in the care plan section)  Progress towards OT goals: Progressing toward goals  Acute Rehab OT Goals Patient Stated Goal: "Feel better, I want to go home, when can i go home?" Time For Goal Achievement: 08/29/14 Potential to Achieve Goals: [place] Discharge plan remains appropriate    Co-evaluation                 End of Session Equipment Utilized During Treatment: Gait belt;Other (comment);Oxygen (1 person hand held assist.)   Activity Tolerance Patient limited by  fatigue   Patient Left in chair;with call bell/phone within reach;Other (comment) (eating breakfast.)   Nurse Communication Mobility status;Other (comment) (ADL/grooming completed and sitting up in chair eating)        Time: 6237-6283 OT Time Calculation (min): 31 min  Charges: OT General Charges $OT Visit: 1 Procedure OT Treatments $Self Care/Home Management : 23-37 mins (31 min)  Ryelan Kazee Beth Dixon, OTR/L 08/17/2014, 9:28 AM

## 2014-08-17 NOTE — Progress Notes (Signed)
CSW Armed forces technical officer) spoke with pt and pt daughter Juliann Pulse and provided with bed offers. They confirmed they want to accept bed at [facility]. CSW notified facility for potential for dc tomorrow 08/18/14.  [place], [place]

## 2014-08-17 NOTE — Clinical Social Work Placement (Signed)
Clinical Social Work Department CLINICAL SOCIAL WORK PLACEMENT NOTE 08/17/2014  Patient:  BEONKA, AMESQUITA  Account Number:  [id number] Admit date:  08/14/2014  Clinical Social Worker:  Aldona Bar Jahziah Simonin, CLINICAL SOCIAL WORKER  Date/time:  08/17/2014 02:47 PM  Clinical Social Work is seeking post-discharge placement for this patient at the following level of care:   SKILLED NURSING   (*CSW will update this form in Epic as items are completed)   08/17/2014  Patient/family provided with [facility] Department of Clinical Social Work's list of facilities offering this level of care within the geographic area requested by the patient (or if unable, by the patient's family).  08/17/2014  Patient/family informed of their freedom to choose among providers that offer the needed level of care, that participate in Medicare, Medicaid or managed care program needed by the patient, have an available bed and are willing to accept the patient.  08/17/2014  Patient/family informed of [facility]' ownership interest in [facility], as well as of the fact that they are under no obligation to receive care at this facility.  PASARR submitted to EDS on 08/17/2014 PASARR number received on 08/17/2014  FL2 transmitted to all facilities in geographic area requested by pt/family on  08/17/2014 FL2 transmitted to all facilities within larger geographic area on   Patient informed that his/her managed care company has contracts with or will negotiate with  certain facilities, including the following:     Patient/family informed of bed offers received:   Patient chooses bed at  Physician recommends and patient chooses bed at    Patient to be transferred to  on   Patient to be transferred to facility by  Patient and family notified of transfer on  Name of family member notified:    The following physician request were entered in Epic: Physician Request  Please sign FL2.    Additional  Comments:

## 2014-08-17 NOTE — Clinical Social Work Psych Assess (Signed)
Clinical Social Work Department BRIEF PSYCHOSOCIAL ASSESSMENT 08/17/2014  Patient:  Susan Mccoy, Susan Mccoy     Account Number:  [id number]     Admit date:  08/14/2014  Clinical Social Worker:  Shantoria Ellwood, [place]  Date/Time:  08/17/2014 11:41 AM  Referred by:  Physician  Date Referred:  08/17/2014 Referred for  Psychosocial assessment   Other Referral:   Interview type:  Other - See comment Other interview type:   CSW spoke with Pt and daugher Susan Mccoy about SNF placement.    PSYCHOSOCIAL DATA Living Status:  HUSBAND Admitted from facility:   Level of care:   Primary support name:  Susan Mccoy Primary support relationship to patient:  FAMILY Degree of support available:   Pt lives with husband Susan Mccoy. Husband had a stroke is not able to help with pt care. He is very supportive of PT wishes. Daughter Susan Mccoy agrees to SNF placement and is supportive.    CURRENT CONCERNS  Other Concerns:    SOCIAL WORK ASSESSMENT / PLAN CSW met with pt and pt daughter. They informed CSW they feel pt cannot manage at home right now. Prior to admission pt was able to do some household chores and was independent. PT prefers [facility]. Pt and Daugter are agreeable for SNF.CSW will update FL2 and send out referral to [place] and [facility] per pt daughter request. CSW will call [facility] to notify of family preference.   Assessment/plan status:  Psychosocial Support/Ongoing Assessment of Needs Other assessment/ plan:   Information/referral to community resources:   CSW gave family SNF list.    PATIENT'S/FAMILY'S RESPONSE TO PLAN OF CARE: Family of pt are very supportive. Pt is pleasent and wants to gain strength before returning home to live with spouse.

## 2014-08-17 NOTE — Care Management Note (Addendum)
  Page 2 of 2   08/18/2014     12:19:38 PM CARE MANAGEMENT NOTE 08/18/2014  Patient:  Susan Mccoy, Susan Mccoy   Account Number:  [id number]  Date Initiated:  08/17/2014  Documentation initiated by:  Mariann Laster  Subjective/Objective Assessment:   CP, sysphagia, Hx/o Aspiration PNA, Esophageal stricture     Action/Plan:   CM to follow for disposition needs   Anticipated DC Date:  08/18/2014   Anticipated DC Plan:  SKILLED NURSING FACILITY  In-house referral  Clinical Social Worker      DC Planning Services  CM consult  Other      [facility] Choice  NA   Choice offered to / List presented to:  C-2 HC POA / Guardian           Status of service:  Completed, signed off Medicare Important Message given?  YES (If response is "NO", the following Medicare IM given date fields will be blank) Date Medicare IM given:  08/17/2014 Medicare IM given by:  Herndon Grill Date Additional Medicare IM given:   Additional Medicare IM given by:    Discharge Disposition:  [place]  Per UR Regulation:  Reviewed for med. necessity/level of care/duration of stay  If discussed at [place] of Stay Meetings, dates discussed:    Comments:  Jezlyn Westerfield RN, BSN, MSHL, CCM  Nurse - Case Manager,  (Unit [place]) [phone]  08/18/2014 PT RECS update:  SNF Disposition Plan:  SNF / [facility] - See SW note for further details.  Naveen Clardy RN, BSN, MSHL, CCM  Nurse - Case Manager,  (Unit [place]) [phone]  08/17/2014 Social:  Per history provided by patient and her DTR/HCPOA/Kathy [name]; patient from home with her husband who has limited mobility d/t hx/o CVA.  Husband is not able to provide any care/asssitance to his wife/patient. Husband has previously refused to allow [facility] workers in the home to manage patient's care d/t preferences to have no visitors and keep the house quiet.   DTR/Kathy states patient is not physically able to go home alone d/t no caregiver in the home  to provide safe care.  DTR and patient state preferrence not to return to home. [place] caregiver care 8-9 hours / day - self pay to provide home mgmt but no medical mgmt. States patient has had memory changes over the past months and prepared thanksgiving dressing by pouring [place] farm dressing into a pan with no other ingredients. Patient has LTC INSURANCE with in home coverage. Pt/DTR interested in [place] and has visited the facility.  SW active in case. DTR states patient has a lot of pain with movement and request staff be patient with her special needs at this time.  DTR states patient has poor skin turgor and gets skin tears easily.  Special needs reported to Unit. Hx/o past HHS with [facility].

## 2014-08-17 NOTE — Progress Notes (Signed)
ANTIBIOTIC CONSULT NOTE   Pharmacy Consult for zosyn Indication: rule out pneumonia  Allergies  Allergen Reactions  . Morphine And Related Other (See Comments)    Very sensitive to all pain meds-narcotic   . Penicillins Other (See Comments)    REACTION: GI Upset  . Sulfonamide Derivatives Other (See Comments)    Unknown. Symptoms were when she was a teenager  . Celebrex [Celecoxib] Rash  . Valium [Diazepam] Other (See Comments)    Patient Measurements: Height: 5\' 1"  (154.9 cm) Weight: 132 lb 0.9 oz (59.9 kg) IBW/kg (Calculated) : 47.8 Adjusted Body Weight:   Vital Signs: Temp: 98.2 F (36.8 C) (01/14 0640) Temp Source: Oral (01/14 0640) BP: 178/54 mmHg (01/14 0640) Pulse Rate: 78 (01/14 0640) Intake/Output from previous day: 01/13 0701 - 01/14 0700 In: 3120.8 [P.O.:900; I.V.:2070.8; IV Piggyback:150] Out: 1200 [Urine:1200] Intake/Output from this shift: Total I/O In: 180 [P.O.:180] Out: 150 [Urine:150]  Labs:  Recent Labs  08/15/14 0634 08/16/14 0120 08/17/14 0439  WBC 11.7* 9.4 9.5  HGB 9.8* 9.3* 8.9*  PLT 255 204 217  CREATININE 2.54* 2.41* 2.39*   Estimated Creatinine Clearance: 15.6 mL/min (by C-G formula based on Cr of 2.39). No results for input(s): VANCOTROUGH, VANCOPEAK, VANCORANDOM, GENTTROUGH, GENTPEAK, GENTRANDOM, TOBRATROUGH, TOBRAPEAK, TOBRARND, AMIKACINPEAK, AMIKACINTROU, AMIKACIN in the last 72 hours.   Microbiology: Recent Results (from the past 720 hour(s))  Surgical pcr screen     Status: None   Collection Time: 08/03/14  6:39 AM  Result Value Ref Range Status   MRSA, PCR NEGATIVE NEGATIVE Final   Staphylococcus aureus NEGATIVE NEGATIVE Final    Comment:        The Xpert SA Assay (FDA approved for NASAL specimens in patients over 51 years of age), is one component of a comprehensive surveillance program.  Test performance has been validated by [employer] for patients greater than or equal to 50 year old. It is not intended to  diagnose infection nor to guide or monitor treatment.   Urine culture     Status: None   Collection Time: 08/14/14  6:08 PM  Result Value Ref Range Status   Specimen Description URINE, RANDOM  Final   Special Requests NONE  Final   Colony Count   Final    >=100,000 COLONIES/ML Performed at [facility]   Final    ESCHERICHIA COLI Performed at [facility]    Report Status 08/16/2014 FINAL  Final   Organism ID, Bacteria ESCHERICHIA COLI  Final      Susceptibility   Escherichia coli - MIC*    AMPICILLIN <=2 SENSITIVE Sensitive     CEFAZOLIN <=4 SENSITIVE Sensitive     CEFTRIAXONE <=1 SENSITIVE Sensitive     CIPROFLOXACIN <=0.25 SENSITIVE Sensitive     GENTAMICIN <=1 SENSITIVE Sensitive     LEVOFLOXACIN <=0.12 SENSITIVE Sensitive     NITROFURANTOIN <=16 SENSITIVE Sensitive     TOBRAMYCIN <=1 SENSITIVE Sensitive     TRIMETH/SULFA <=20 SENSITIVE Sensitive     PIP/TAZO <=4 SENSITIVE Sensitive     * ESCHERICHIA COLI  Blood culture (routine x 2)     Status: None (Preliminary result)   Collection Time: 08/14/14  6:14 PM  Result Value Ref Range Status   Specimen Description BLOOD ARM RIGHT  Final   Special Requests BOTTLES DRAWN AEROBIC AND ANAEROBIC 5CC  Final   Culture   Final           BLOOD CULTURE RECEIVED NO  GROWTH TO DATE CULTURE WILL BE HELD FOR 5 DAYS BEFORE ISSUING A FINAL NEGATIVE REPORT Performed at [facility]    Report Status PENDING  Incomplete  Blood culture (routine x 2)     Status: None (Preliminary result)   Collection Time: 08/14/14  6:22 PM  Result Value Ref Range Status   Specimen Description BLOOD RIGHT FOREARM  Final   Special Requests BOTTLES DRAWN AEROBIC AND ANAEROBIC 5CC  Final   Culture   Final           BLOOD CULTURE RECEIVED NO GROWTH TO DATE CULTURE WILL BE HELD FOR 5 DAYS BEFORE ISSUING A FINAL NEGATIVE REPORT Performed at [facility]    Report Status PENDING  Incomplete  Culture, blood (routine x  2) Call MD if unable to obtain prior to antibiotics being given     Status: None (Preliminary result)   Collection Time: 08/14/14  9:28 PM  Result Value Ref Range Status   Specimen Description BLOOD RIGHT ARM  Final   Special Requests BOTTLES DRAWN AEROBIC AND ANAEROBIC 10CC  Final   Culture   Final           BLOOD CULTURE RECEIVED NO GROWTH TO DATE CULTURE WILL BE HELD FOR 5 DAYS BEFORE ISSUING A FINAL NEGATIVE REPORT Note: Culture results may be compromised due to an excessive volume of blood received in culture bottles. Performed at [facility]    Report Status PENDING  Incomplete  Culture, blood (routine x 2) Call MD if unable to obtain prior to antibiotics being given     Status: None (Preliminary result)   Collection Time: 08/14/14  9:35 PM  Result Value Ref Range Status   Specimen Description BLOOD RIGHT WRIST  Final   Special Requests BOTTLES DRAWN AEROBIC AND ANAEROBIC 5CC  Final   Culture   Final           BLOOD CULTURE RECEIVED NO GROWTH TO DATE CULTURE WILL BE HELD FOR 5 DAYS BEFORE ISSUING A FINAL NEGATIVE REPORT Performed at [facility]    Report Status PENDING  Incomplete    Medical History: Past Medical History  Diagnosis Date  . Hypertension   . Thyroid disease   . Abnormal LFTs     Fatty liver by [place]  . Hypercholesterolemia   . Carpal tunnel syndrome   . DJD (degenerative joint disease)   . Shingles   . Meralgia paraesthetica   . GERD (gastroesophageal reflux disease)   . Pulmonary fibrosis   . Hemorrhoids   . Diverticulosis   . Normocytic normochromic anemia   . History of recurrent TIAs   . UTI (lower urinary tract infection)   . Chronic renal disease, stage 4, severely decreased glomerular filtration rate between 15-29 mL/min/1.73 square meter   . Anemia of chronic kidney failure   . Diabetes mellitus without complication     Type 2  . CHF (congestive heart failure)     pt not aware of this  . Bipolar disorder     in the past  .  Constipation   . Macular degeneration   . Seizures     questionably had when she had TIA's, takes Primodone, states it has helped with the "shaking"  . Complication of anesthesia     trouble with putting her to sleep - 12/15 - pt doesn't remember this    Medications:  Prescriptions prior to admission  Medication Sig Dispense Refill Last Dose  . acetaminophen (TYLENOL)  500 MG tablet Take 1,000 mg by mouth every 6 (six) hours as needed for moderate pain.    Past Week at Unknown time  . allopurinol (ZYLOPRIM) 300 MG tablet Take 150 mg by mouth daily.   08/14/2014 at Unknown time  . aspirin EC 81 MG tablet Take 81 mg by mouth daily.   08/14/2014 at Unknown time  . atenolol (TENORMIN) 25 MG tablet Take 25 mg by mouth daily.    08/14/2014 at 1000  . beta carotene w/minerals (OCUVITE) tablet Take 1 tablet by mouth daily.   08/14/2014 at Unknown time  . calcitRIOL (ROCALTROL) 0.25 MCG capsule Take 0.25 mcg by mouth every other day.   08/13/2014 at Unknown time  . cholecalciferol (VITAMIN D) 1000 UNITS tablet Take 1,000 Units by mouth daily.   08/14/2014 at Unknown time  . Cyanocobalamin (VITAMIN B-12 IJ) Inject 1 Syringe as directed every 30 (thirty) days. As directed. Due 08/19/2013   nov 2015  . cyclobenzaprine (FLEXERIL) 10 MG tablet Take 5 mg by mouth at bedtime.   08/13/2014 at Unknown time  . ferrous sulfate 325 (65 FE) MG tablet Take 325 mg by mouth daily with breakfast.   08/14/2014 at Unknown time  . glipiZIDE (GLUCOTROL XL) 10 MG 24 hr tablet Take 10 mg by mouth daily.   08/14/2014 at Unknown time  . HYDROcodone-acetaminophen (NORCO) 5-325 MG per tablet Take 1 tablet by mouth every 6 (six) hours as needed for moderate pain. 15 tablet 0 Past Month at Unknown time  . hydrOXYzine (ATARAX/VISTARIL) 25 MG tablet Take 25 mg by mouth 2 (two) times daily as needed for itching.    Past Month at Unknown time  . lansoprazole (PREVACID) 30 MG capsule Take 30 mg by mouth daily. Before a meal once a day    08/14/2014 at Unknown time  . primidone (MYSOLINE) 50 MG tablet Take 50 mg by mouth 2 (two) times daily.   08/14/2014 at Unknown time  . simvastatin (ZOCOR) 20 MG tablet Take 20 mg by mouth at bedtime.    08/13/2014 at Unknown time  . TORSEMIDE PO Take 40 mg by mouth daily.    08/14/2014 at Unknown time   Scheduled:  . allopurinol  150 mg Oral Daily  . amLODipine  5 mg Oral Daily  . antiseptic oral rinse  7 mL Mouth Rinse BID  . aspirin EC  81 mg Oral Daily  . atenolol  25 mg Oral Daily  . beta carotene w/minerals  1 tablet Oral Daily  . calcitRIOL  0.25 mcg Oral QODAY  . cholecalciferol  1,000 Units Oral Daily  . cyclobenzaprine  5 mg Oral QHS  . docusate sodium  100 mg Oral BID  . ferrous sulfate  325 mg Oral Q breakfast  . heparin  5,000 Units Subcutaneous 3 times per day  . insulin aspart  0-9 Units Subcutaneous TID WC  . pantoprazole  20 mg Oral Daily  . piperacillin-tazobactam (ZOSYN)  IV  2.25 g Intravenous 3 times per day  . primidone  50 mg Oral BID  . simvastatin  20 mg Oral QHS   Infusions:  . sodium chloride 10 mL/hr at 08/16/14 1733   Assessment: 81yo female with ESRD not on dialysis (has fistula) presents with chest pain. Patient continues on zosyn for ?aspiration PNA. She also has pan sens E coli in urine.   Goal of Therapy:  Appropriate antibiotic dose for renal function  Plan:  Cont zosyn 2.25 gm IV q8 hours  Thanks for allowing pharmacy to be a part of this patient's care.  Excell Seltzer, PharmD Clinical Pharmacist, [phone] 08/17/2014,10:10 AM

## 2014-08-18 DIAGNOSIS — N189 Chronic kidney disease, unspecified: Secondary | ICD-10-CM

## 2014-08-18 DIAGNOSIS — D631 Anemia in chronic kidney disease: Secondary | ICD-10-CM

## 2014-08-18 LAB — BASIC METABOLIC PANEL
ANION GAP: 5 (ref 5–15)
BUN: 34 mg/dL — ABNORMAL HIGH (ref 6–23)
CALCIUM: 8.6 mg/dL (ref 8.4–10.5)
CO2: 23 mmol/L (ref 19–32)
Chloride: 109 mEq/L (ref 96–112)
Creatinine, Ser: 2.4 mg/dL — ABNORMAL HIGH (ref 0.50–1.10)
GFR calc Af Amer: 21 mL/min — ABNORMAL LOW (ref >90)
GFR, EST NON AFRICAN AMERICAN: 18 mL/min — AB (ref >90)
GLUCOSE: 109 mg/dL — AB (ref 70–99)
POTASSIUM: 4.6 mmol/L (ref 3.5–5.1)
SODIUM: 137 mmol/L (ref 135–145)

## 2014-08-18 LAB — GLUCOSE, CAPILLARY
GLUCOSE-CAPILLARY: 107 mg/dL — AB (ref 70–99)
Glucose-Capillary: 180 mg/dL — ABNORMAL HIGH (ref 70–99)

## 2014-08-18 LAB — CBC
HCT: 27.6 % — ABNORMAL LOW (ref 36.0–46.0)
Hemoglobin: 9.1 g/dL — ABNORMAL LOW (ref 12.0–15.0)
MCH: 33 pg (ref 26.0–34.0)
MCHC: 33 g/dL (ref 30.0–36.0)
MCV: 100 fL (ref 78.0–100.0)
PLATELETS: 221 10*3/uL (ref 150–400)
RBC: 2.76 MIL/uL — ABNORMAL LOW (ref 3.87–5.11)
RDW: 14.5 % (ref 11.5–15.5)
WBC: 8.4 10*3/uL (ref 4.0–10.5)

## 2014-08-18 MED ORDER — INSULIN ASPART 100 UNIT/ML ~~LOC~~ SOLN: 0.0000 [IU] | Freq: Three times a day (TID) | SUBCUTANEOUS | Status: DC

## 2014-08-18 MED ORDER — AMLODIPINE BESYLATE 5 MG PO TABS: 5.0000 mg | ORAL_TABLET | Freq: Every day | ORAL | Status: DC

## 2014-08-18 MED ORDER — AMOXICILLIN-POT CLAVULANATE 500-125 MG PO TABS: 1.0000 | ORAL_TABLET | Freq: Every day | ORAL | Status: DC

## 2014-08-18 MED ORDER — FERROUS SULFATE 325 (65 FE) MG PO TABS
325.0000 mg | ORAL_TABLET | Freq: Every day | ORAL | Status: DC
  Administered 2014-08-18: 325 mg via ORAL
  Filled 2014-08-18 (×2): qty 1

## 2014-08-18 NOTE — Discharge Instructions (Signed)
Aspiration Pneumonia °Aspiration pneumonia is an infection in your lungs. It occurs when food, liquid, or stomach contents (vomit) are inhaled (aspirated) into your lungs. When these things get into your lungs, swelling (inflammation) and infection can occur. This can make it difficult for you to breathe. Aspiration pneumonia is a serious condition and can be life threatening. °RISK FACTORS °Aspiration pneumonia is more likely to occur when a person's cough (gag) reflex or ability to swallow has been decreased. Some things that can do this include:  °· Having a brain injury or disease, such as stroke, seizures, Parkinson's disease, dementia, or amyotrophic lateral sclerosis (ALS).   °· Being given general anesthetic for procedures.   °· Being in a coma (unconscious).   °· Having a narrowing of the tube that carries food to the stomach (esophagus).   °· Drinking too much alcohol. If a person passes out and vomits, vomit can be swallowed into the lungs.   °· Taking certain medicines, such as tranquilizers or sedatives.   °SIGNS AND SYMPTOMS  °· Coughing after swallowing food or liquids.   °· Breathing problems, such as wheezing or shortness of breath.   °· Bluish skin. This can be caused by lack of oxygen.   °· Coughing up food or mucus. The mucus might contain blood, greenish material, or yellowish-white fluid (pus).   °· Fever.   °· Chest pain.   °· Being more tired than usual (fatigue).   °· Sweating more than usual.   °· Bad breath.   °DIAGNOSIS  °A physical exam will be done. During the exam, the health care provider will listen to your lungs with a stethoscope to check for:  °· Crackling sounds in the lungs. °· Decreased breath sounds. °· A rapid heartbeat. °Various tests may be ordered. These may include:  °· Chest X-ray.   °· CT scan.   °· Swallowing study. This test looks at how food is swallowed and whether it goes into your breathing tube (trachea) or food pipe (esophagus).   °· Sputum culture. Saliva and  mucus (sputum) are collected from the lungs or the tubes that carry air to the lungs (bronchi). The sputum is then tested for bacteria.   °· Bronchoscopy. This test uses a flexible tube (bronchoscope) to see inside the lungs. °TREATMENT  °Treatment will usually include antibiotic medicines. Other medicines may also be used to reduce fever or pain. You may need to be treated in the hospital. In the hospital, your breathing will be carefully monitored. Depending on how well you are breathing, you may need to be given oxygen, or you may need breathing support from a breathing machine (ventilator). For people who fail a swallowing study, a feeding tube might be placed in the stomach, or they may be asked to avoid certain food textures or liquids when they eat. °HOME CARE INSTRUCTIONS  °· Carefully follow any special eating instructions you were given, such as avoiding certain food textures or thickening liquids. This reduces the risk of developing aspiration pneumonia again.  °· Only take over-the-counter or prescription medicines as directed by your health care provider. Follow the directions carefully.   °· If you were prescribed antibiotics, take them as directed. Finish them even if you start to feel better.   °· Rest as instructed by your health care provider.   °· Keep all follow-up appointments with your health care provider.   °SEEK MEDICAL CARE IF:  °· You develop worsening shortness of breath, wheezing, or difficulty breathing.   °· You develop a fever.   °· You have chest pain.   °MAKE SURE YOU:  °· Understand these instructions. °· Will watch your   condition. °· Will get help right away if you are not doing well or get worse. °Document Released: 05/18/2009 Document Revised: 07/26/2013 Document Reviewed: 01/06/2013 °ExitCare® Patient Information ©2015 [employer]. This information is not intended to replace advice given to you by your health care provider. Make sure you discuss any questions you have with  your health care provider. ° °

## 2014-08-18 NOTE — Progress Notes (Signed)
Receiving nurse from [facility] call and I was able to give her report r/t pt.  I printed the AVS, noting next dose times, and placed in packet of information.  Daughter of pt will give packet to [facility] on arrival.  Daughter transported pt to [facility]  IV removed.  She was non-telemetry pt.  Pt in no acute distress. Pt was discharged in wheelchair.

## 2014-08-18 NOTE — Progress Notes (Signed)
GASTROENTEROLOGY PROGRESS NOTE  Problem:   Dysphagia, multifactorial. Odynophagia related to esophageal ulceration.  Subjective: The patient is still having some discomfort on swallowing.  Objective: The biopsies of her mid esophageal ulceration (thought to probably be due to pill induced esophagitis) are still pending.  Her modified barium swallow showed normal oropharyngeal function, no evidence of aspiration to account for her recent possible aspiration pneumonia.  Her barium swallow yesterday showed disordered motility consistent with presbyesophagus, and transient hang-up of the barium tablet at the level of her Schatzki's ring (although that study was done with the patient semi-recumbent, which may have diminished esophageal transit, in view of her poor motility).  Assessment: 1. Odynophagia still present, but not severe 2. Both anatomic and motility factors in her dysphagia  Plan: 1. Await esophageal biopsy results (please notify patient of those results). The patient and her daughter are aware that it will take time for this ulcer to heal, and that there is not a specific treatment for it; it simply has to heal on its own.  2. I reviewed with the patient, and her daughter [name] [phone]) who is also her power of attorney, the above findings, and recommendations for "tips" on swallowing: Eat sitting upright, chop her food into very small pieces, alternate sips of liquids with boluses of solid food.  3. At this time, it does not seem that her swallowing symptoms are of sufficient severity to necessitate dilatation of her Schatzki's ring, which was rather widely patent when I endoscoped her 2 days ago. However, I encouraged the patient's daughter to contact me in the future if she has frequent, or prolonged, episodes of dysphagia or has to restrict her diet severely because of swallowing difficulties, in which case we could reconsider the option of esophageal dilatation. We  reviewed the risks of dilatation today, and we all seem to be in agreement that,, because of her relatively modest symptoms, it is not worth the risk at the present time. I have given the patient's daughter my card, to facilitate her contacting me in the future if needed. Moreover, her swallowing function may improve as her esophageal ulcer heals.  Cleotis Nipper, M.D. 08/18/2014 11:24 AM

## 2014-08-18 NOTE — Discharge Summary (Signed)
Physician Discharge Summary  Susan Mccoy EAV:[id number] DOB: 09/26/1931 DOA: 08/14/2014  PCP: Henrine Screws, MD  Admit date: 08/14/2014 Discharge date: 08/18/2014  Time spent: Greater than 30 minutes  Recommendations for Outpatient Follow-up:  1. M.D. at SNF, in 3 days with repeat labs (CBC & BMP). 2. Dr. Dwaine Deter, PCP: Upon discharge from SNF. 3. Dr. Fleet Contras, nephrology: In 1 week. SNF to arrange appointment. 4. Dr. Herbie Baltimore Buccini, GI, as needed 5. Recommend repeating chest x-ray in 4-6 weeks to ensure resolution of pneumonia findings.  Discharge Diagnoses:  Principal Problem:   Difficulty swallowing Active Problems:   Diabetes mellitus without complication   HYPERCHOLESTEROLEMIA   Anemia of chronic kidney failure   Hypertension   GERD (gastroesophageal reflux disease)   CHF (congestive heart failure)   Chronic renal disease, stage 4, severely decreased glomerular filtration rate between 15-29 mL/min/1.73 square meter   Aspiration pneumonia   History of transient ischemic attack (TIA)   Discharge Condition: Improved & Stable  Diet recommendation: Renal and diabetic diet.  Filed Weights   08/16/14 0505 08/17/14 0640 08/18/14 0414  Weight: 58.378 kg (128 lb 11.2 oz) 59.9 kg (132 lb 0.9 oz) 60.2 kg (132 lb 11.5 oz)    History of present illness:  Patient is a pleasant 81 year old female with a past medical history of esophageal strictures undergoing dilatation approximate 5 years ago was admitted to medicine service on 08/14/2014. She presented with complaints of difficulty swallowing as well as having some cough. Initial workup performed in the emergency department revealed a white count of 11,700. She had a chest x-ray which revealed densities in the left lower chest could be concerning for pneumonia or aspiration. She was started on empiric IV antimicrobial therapy for possible aspiration pneumonia. Meanwhile GI was consulted, patient underwent  upper endoscopy on 08/16/2014.   Hospital Course:    Odynophagia/H/o GERD and esophageal stricture -Patient with history of esophageal strictures presenting with complaints of dysphagia. -She was seen and evaluated by GI, and EGD 1/13 showed mid esophageal ulceration due to pill-induced esophagitis which would account for her odynophagia. Although she has a Schatzki's ring, it appears to be widely patent. MBSS performed 1/13 and speech therapy recommended a regular diet and thin liquids-tolerating same. - barium swallow with pill today: Transient delay in pill passing and age-related esophageal dysmotility. - Esophageal biopsy results pending-will be followed by GI and patient/family will be updated. - GI has discussed extensively with patient's daughter/healthcare power of attorney regarding her extensive GI workup in the hospital and management plans. As per GI, it does not seem that her swallowing symptoms are of sufficient severity to necessitate dilatation of her Schatzki's ring which is rather widely patent by EGD 2 days ago. However if she the lips frequent or prolonged episodes of dysphagia then may have to consider dilatation. -  GI has cleared patient for discharge on once daily PPI.  2. Possible aspiration pneumonia LLL pneumonia. - Chest x-ray performed in the emergency department revealed densities in the left lower lobe that could be secondary to aspiration pneumonia - Patient is being covered with empiric IV antimicrobial therapy with Zosyn. Will discontinue IV vancomycin - It is possible she may have aspirated from problem #1. Urine Legionella and pneumococcal antigen: Negative - Discussed extensively with patient and daughter at bedside: Patient apparently has tolerated Augmentin in the past which was only a intolerance and not allergy and hence we decided to complete the 1 week antibiotic course with oral Augmentin  adjusted to renal function which she will complete on  08/19/13.  3. Hypertension.  - Continue atenolol 25 mg by mouth daily. Amlodipine 5 MG daily added with improved control. May need to adjust medications.  4. Stage III chronic kidney disease -Patient with baseline creatinine near 2.2-2.4 - Stable Creatinine in the 2.4 range over the last 3-4 days. - At family's request discussed with Dr Hassell Done Web, nephrology and reviewed patient's renal function labs with him and he stated that patient was stable for discharge to SNF and can follow-up with Dr. Mercy Moore as outpatient.  5. Macrocytic anemia stable.  - Reasonably stable.   6. Hypoglycemia in patient with DM 2 - Possibly from nothing by mouth status for procedure 1/13 morning. Improved and diet resumed. Monitor closely. no further hypoglycemic episodes. Given hypoglycemia with oral agents in context of renal insufficiency, holding Glucotrol and continue SSI at SNF.  7. Escherichia coli UTI - Zosyn and now Augmentin should cover.  Consultants:  GI  Procedures:  EGD on 08/16/2014  Discharge Exam:  Complaints: Patient denies complaints. Anxious for discharge. Swallowing improved. Denies cough or dyspnea.   Filed Vitals:   08/17/14 1010 08/17/14 2024 08/18/14 0414 08/18/14 1025  BP: 148/49 168/56 163/61 163/53  Pulse: 79 85 77 81  Temp:  98.1 F (36.7 C) 97.8 F (36.6 C)   TempSrc:  Oral Oral   Resp:  20 20   Height:      Weight:   60.2 kg (132 lb 11.5 oz)   SpO2:  96% 100%     General: Patient is awake, alert, no acute distress.   Cardiovascular: Regular rate and rhythm normal S1-S2.   Respiratory: Clear to auscultation. No increased work of breathing.  Abdomen: Soft nontender nondistended  CNS: Alert and oriented. No focal deficits.  Discharge Instructions      Discharge Instructions    Call MD for:  difficulty breathing, headache or visual disturbances    Complete by:  As directed      Call MD for:  temperature >100.4    Complete by:  As directed       Call MD for:    Complete by:  As directed   Difficulty swallowing.     Discharge instructions    Complete by:  As directed   DIET: Renal & Diabetic diet.     Increase activity slowly    Complete by:  As directed             Medication List    STOP taking these medications        glipiZIDE 10 MG 24 hr tablet  Commonly known as:  GLUCOTROL XL     HYDROcodone-acetaminophen 5-325 MG per tablet  Commonly known as:  NORCO     TORSEMIDE PO      TAKE these medications        acetaminophen 500 MG tablet  Commonly known as:  TYLENOL  Take 1,000 mg by mouth every 6 (six) hours as needed for moderate pain.     allopurinol 300 MG tablet  Commonly known as:  ZYLOPRIM  Take 150 mg by mouth daily.     amLODipine 5 MG tablet  Commonly known as:  NORVASC  Take 1 tablet (5 mg total) by mouth daily.     amoxicillin-clavulanate 500-125 MG per tablet  Commonly known as:  AUGMENTIN  Take 1 tablet (500 mg total) by mouth daily. Discontinue after 08/21/14 dose.  Start taking on:  08/19/2014  aspirin EC 81 MG tablet  Take 81 mg by mouth daily.     atenolol 25 MG tablet  Commonly known as:  TENORMIN  Take 25 mg by mouth daily.     beta carotene w/minerals tablet  Take 1 tablet by mouth daily.     calcitRIOL 0.25 MCG capsule  Commonly known as:  ROCALTROL  Take 0.25 mcg by mouth every other day.     cholecalciferol 1000 UNITS tablet  Commonly known as:  VITAMIN D  Take 1,000 Units by mouth daily.     cyclobenzaprine 10 MG tablet  Commonly known as:  FLEXERIL  Take 5 mg by mouth at bedtime.     ferrous sulfate 325 (65 FE) MG tablet  Take 325 mg by mouth daily with breakfast.     hydrOXYzine 25 MG tablet  Commonly known as:  ATARAX/VISTARIL  Take 25 mg by mouth 2 (two) times daily as needed for itching.     insulin aspart 100 UNIT/ML injection  Commonly known as:  novoLOG  - Inject 0-9 Units into the skin 3 (three) times daily with meals. Correction coverage: Sensitive  (thin, NPO, renal)  - CBG < 70: implement hypoglycemia protocol  - CBG 70 - 120: 0 units  - CBG 121 - 150: 1 unit  - CBG 151 - 200: 2 units  - CBG 201 - 250: 3 units  - CBG 251 - 300: 5 units  - CBG 301 - 350: 7 units  - CBG 351 - 400: 9 units  - CBG > 400: call MD     lansoprazole 30 MG capsule  Commonly known as:  PREVACID  Take 30 mg by mouth daily. Before a meal once a day     primidone 50 MG tablet  Commonly known as:  MYSOLINE  Take 50 mg by mouth 2 (two) times daily.     simvastatin 20 MG tablet  Commonly known as:  ZOCOR  Take 20 mg by mouth at bedtime.     VITAMIN B-12 IJ  Inject 1 Syringe as directed every 30 (thirty) days. As directed. Due 08/19/2013       Follow-up Information    Follow up with GATES,ROBERT NEVILL, MD.   Specialty:  Internal Medicine   Why:  to be seen upon discharge from SNF.   Contact information:   [address] [address] [phone]       Follow up with M.D. at SNF. Schedule an appointment as soon as possible for a visit in 3 days.   Why:  To be seen with repeat labs (CBC & BMP).      Follow up with MATTINGLY,MICHAEL T, MD. Schedule an appointment as soon as possible for a visit in 1 week.   Specialty:  Nephrology   Contact information:   [place] [address] [phone]       Follow up with Cleotis Nipper, MD.   Specialty:  Gastroenterology   Why:  As needed   Contact information:   [address], [place] [address] [phone]        The results of significant diagnostics from this hospitalization (including imaging, microbiology, ancillary and laboratory) are listed below for reference.    Significant Diagnostic Studies: Dg Chest 2 View  08/14/2014   CLINICAL DATA:  Tightness in the esophagus with chest pain.  EXAM: CHEST  2 VIEW  COMPARISON:  08/07/2013  FINDINGS: There are patchy interstitial lung  markings throughout both lungs. The left  hemidiaphragm is obscured and there is concern for airspace disease in the left lower lobe. Cannot exclude fullness in the left hilar region. The aortic arch is heavily calcified. No large pleural effusions based on the lateral view. Difficult to assess the cardiac size.  IMPRESSION: Densities in the left lower chest that obscure the left hemidiaphragm. Findings are concerning for pneumonia or aspiration. Difficult to exclude fullness in the left hilum. Recommend follow-up to ensure resolution.  Patchy interstitial densities in both lungs may represent a combination of chronic changes and low lung volumes.   Electronically Signed   By: Markus Daft M.D.   On: 08/14/2014 17:21   Dg Esophagus  08/17/2014   CLINICAL DATA:  Distal esophageal dysphagia for solids and pills  EXAM: ESOPHOGRAM/BARIUM SWALLOW  TECHNIQUE: Single contrast examination was performed using thin barium. 12.5 mm diameter barium tablet was also given.  FLUOROSCOPY TIME:  2 min 30 seconds  COMPARISON:  None  FINDINGS: Significant diffuse impairment of esophageal motility.  No gross evidence of esophageal mass or stricture.  Small outpouching of contrast was identified at the distal esophagus.  Initially I believed this to be a small epiphrenic diverticulum.  However, review of the entirety of the exam shows this is not persistent and likely represents a small sliding hiatal hernia with intermittent retention of contrast within the hernia sac above the diaphragm.  12.5 mm diameter barium tablet transiently delayed within this but then passed beyond following multiple swallows of thin barium and water.  No definite persistent intraluminal filling defects.  As examination was performed horizontal LPO/RPO and patient has no cervical esophageal symptoms, dedicated assessment of oropharyngeal phase of swallowing was not performed.  Atherosclerotic calcifications of the aorta noted.  Bones appear demineralized.  IMPRESSION: Small sliding hiatal hernia with  transient delay of the 12.5 mm diameter barium tablet within the hernia sac above the diaphragm, initially passing into the stomach with additional swallows of thin barium and water.  Significant diffuse age-related esophageal dysmotility.  No evidence of esophageal mass or stricture.   Electronically Signed   By: Lavonia Dana M.D.   On: 08/17/2014 13:50    Microbiology: Recent Results (from the past 240 hour(s))  Urine culture     Status: None   Collection Time: 08/14/14  6:08 PM  Result Value Ref Range Status   Specimen Description URINE, RANDOM  Final   Special Requests NONE  Final   Colony Count   Final    >=100,000 COLONIES/ML Performed at [facility]   Final    ESCHERICHIA COLI Performed at [facility]    Report Status 08/16/2014 FINAL  Final   Organism ID, Bacteria ESCHERICHIA COLI  Final      Susceptibility   Escherichia coli - MIC*    AMPICILLIN <=2 SENSITIVE Sensitive     CEFAZOLIN <=4 SENSITIVE Sensitive     CEFTRIAXONE <=1 SENSITIVE Sensitive     CIPROFLOXACIN <=0.25 SENSITIVE Sensitive     GENTAMICIN <=1 SENSITIVE Sensitive     LEVOFLOXACIN <=0.12 SENSITIVE Sensitive     NITROFURANTOIN <=16 SENSITIVE Sensitive     TOBRAMYCIN <=1 SENSITIVE Sensitive     TRIMETH/SULFA <=20 SENSITIVE Sensitive     PIP/TAZO <=4 SENSITIVE Sensitive     * ESCHERICHIA COLI  Blood culture (routine x 2)     Status: None (Preliminary result)   Collection Time: 08/14/14  6:14 PM  Result Value Ref Range  Status   Specimen Description BLOOD ARM RIGHT  Final   Special Requests BOTTLES DRAWN AEROBIC AND ANAEROBIC 5CC  Final   Culture   Final           BLOOD CULTURE RECEIVED NO GROWTH TO DATE CULTURE WILL BE HELD FOR 5 DAYS BEFORE ISSUING A FINAL NEGATIVE REPORT Performed at [facility]    Report Status PENDING  Incomplete  Blood culture (routine x 2)     Status: None (Preliminary result)   Collection Time: 08/14/14  6:22 PM  Result Value Ref Range Status    Specimen Description BLOOD RIGHT FOREARM  Final   Special Requests BOTTLES DRAWN AEROBIC AND ANAEROBIC 5CC  Final   Culture   Final           BLOOD CULTURE RECEIVED NO GROWTH TO DATE CULTURE WILL BE HELD FOR 5 DAYS BEFORE ISSUING A FINAL NEGATIVE REPORT Performed at [facility]    Report Status PENDING  Incomplete  Culture, blood (routine x 2) Call MD if unable to obtain prior to antibiotics being given     Status: None (Preliminary result)   Collection Time: 08/14/14  9:28 PM  Result Value Ref Range Status   Specimen Description BLOOD RIGHT ARM  Final   Special Requests BOTTLES DRAWN AEROBIC AND ANAEROBIC 10CC  Final   Culture   Final           BLOOD CULTURE RECEIVED NO GROWTH TO DATE CULTURE WILL BE HELD FOR 5 DAYS BEFORE ISSUING A FINAL NEGATIVE REPORT Note: Culture results may be compromised due to an excessive volume of blood received in culture bottles. Performed at [facility]    Report Status PENDING  Incomplete  Culture, blood (routine x 2) Call MD if unable to obtain prior to antibiotics being given     Status: None (Preliminary result)   Collection Time: 08/14/14  9:35 PM  Result Value Ref Range Status   Specimen Description BLOOD RIGHT WRIST  Final   Special Requests BOTTLES DRAWN AEROBIC AND ANAEROBIC 5CC  Final   Culture   Final           BLOOD CULTURE RECEIVED NO GROWTH TO DATE CULTURE WILL BE HELD FOR 5 DAYS BEFORE ISSUING A FINAL NEGATIVE REPORT Performed at [facility]    Report Status PENDING  Incomplete     Labs: Basic Metabolic Panel:  Recent Labs Lab 08/14/14 1547 08/14/14 1751 08/15/14 0634 08/16/14 0120 08/17/14 0439 08/18/14 0535  NA 131* 135 134* 135 141 137  K 4.9 5.1 4.5 4.6 4.8 4.6  CL 101 99 104 104 106 109  CO2 25  --  24 23 23 23   GLUCOSE 239* 245* 63* 85 100* 109*  BUN 52* 50* 49* 46* 37* 34*  CREATININE 2.60* 2.30* 2.54* 2.41* 2.39* 2.40*  CALCIUM 8.9  --  8.4 8.3* 8.5 8.6   Liver Function  Tests:  Recent Labs Lab 08/15/14 0634  AST 28  ALT 31  ALKPHOS 88  BILITOT 0.7  PROT 7.0  ALBUMIN 3.2*   No results for input(s): LIPASE, AMYLASE in the last 168 hours. No results for input(s): AMMONIA in the last 168 hours. CBC:  Recent Labs Lab 08/14/14 1547 08/14/14 1751 08/15/14 0634 08/16/14 0120 08/17/14 0439 08/18/14 0535  WBC 19.5*  --  11.7* 9.4 9.5 8.4  NEUTROABS  --   --  6.1  --   --   --   HGB 11.4* 12.6 9.8*  9.3* 8.9* 9.1*  HCT 34.9* 37.0 30.5* 28.9* 27.1* 27.6*  MCV 99.7  --  98.1 101.0* 98.5 100.0  PLT 257  --  255 204 217 221   Cardiac Enzymes:  Recent Labs Lab 08/15/14 2014 08/16/14 0120 08/16/14 0727  TROPONINI <0.03 <0.03 <0.03   BNP: BNP (last 3 results) No results for input(s): PROBNP in the last 8760 hours. CBG:  Recent Labs Lab 08/17/14 1055 08/17/14 1714 08/17/14 2114 08/18/14 0643 08/18/14 1156  GLUCAP 175* 72 201* 107* 180*      Signed:  Vernell Leep, MD, FACP, FHM. [facility] Pager [phone]  If 7PM-7AM, please contact night-coverage [email or web address] Password [email or web address] 08/18/2014, 3:07 PM

## 2014-08-18 NOTE — Progress Notes (Signed)
Attempted to call report to receiving nurse at [facility] [phone]), and she was unable to come to the phone at this time.  I requested that she give me a return call so that I may give her report.

## 2014-08-18 NOTE — Progress Notes (Addendum)
UR completed Zeanna Sunde K. Sarea Fyfe, RN, BSN, MSHL, CCM  08/18/2014 12:12 PM

## 2014-08-21 ENCOUNTER — Non-Acute Institutional Stay (SKILLED_NURSING_FACILITY): Payer: Medicare Other | Admitting: Registered Nurse

## 2014-08-21 DIAGNOSIS — N184 Chronic kidney disease, stage 4 (severe): Secondary | ICD-10-CM

## 2014-08-21 DIAGNOSIS — E118 Type 2 diabetes mellitus with unspecified complications: Secondary | ICD-10-CM

## 2014-08-21 DIAGNOSIS — K2100 Gastro-esophageal reflux disease with esophagitis, without bleeding: Secondary | ICD-10-CM

## 2014-08-21 DIAGNOSIS — R531 Weakness: Secondary | ICD-10-CM

## 2014-08-21 DIAGNOSIS — N189 Chronic kidney disease, unspecified: Secondary | ICD-10-CM

## 2014-08-21 DIAGNOSIS — J690 Pneumonitis due to inhalation of food and vomit: Secondary | ICD-10-CM

## 2014-08-21 DIAGNOSIS — I10 Essential (primary) hypertension: Secondary | ICD-10-CM

## 2014-08-21 DIAGNOSIS — K5900 Constipation, unspecified: Secondary | ICD-10-CM

## 2014-08-21 DIAGNOSIS — D631 Anemia in chronic kidney disease: Secondary | ICD-10-CM

## 2014-08-21 DIAGNOSIS — E785 Hyperlipidemia, unspecified: Secondary | ICD-10-CM

## 2014-08-21 LAB — CULTURE, BLOOD (ROUTINE X 2)
CULTURE: NO GROWTH
Culture: NO GROWTH
Culture: NO GROWTH
Culture: NO GROWTH

## 2014-08-21 NOTE — Clinical Social Work Placement (Addendum)
Clinical Social Work Department CLINICAL SOCIAL WORK PLACEMENT NOTE 08/18/2014  Patient:  Susan Mccoy, Susan Mccoy  Account Number:  [id number] Admit date:  08/14/2014  Clinical Social Worker:  Aldona Bar DOLLARHITE, CLINICAL SOCIAL WORKER  Date/time:  08/17/2014 02:47 PM  Clinical Social Work is seeking post-discharge placement for this patient at the following level of care:   SKILLED NURSING   (*CSW will update this form in Epic as items are completed)   08/17/2014  Patient/family provided with [place] Department of Clinical Social Work's list of facilities offering this level of care within the geographic area requested by the patient (or if unable, by the patient's family).  08/17/2014  Patient/family informed of their freedom to choose among providers that offer the needed level of care, that participate in Medicare, Medicaid or managed care program needed by the patient, have an available bed and are willing to accept the patient.  08/17/2014  Patient/family informed of [facility]' ownership interest in [facility], as well as of the fact that they are under no obligation to receive care at this facility.  PASARR submitted to EDS on 08/17/2014 PASARR number received on 08/17/2014  FL2 transmitted to all facilities in geographic area requested by pt/family on  08/17/2014 FL2 transmitted to all facilities within larger geographic area on   Patient informed that his/her managed care company has contracts with or will negotiate with  certain facilities, including the following:   NA     Patient/family informed of bed offers received:  08/17/2014 Patient chooses bed at [facility] Physician recommends and patient chooses bed at    Patient to be transferred to [facility] on  08/18/2014 Patient to be transferred to facility by [facility]- with daughter Patient and family notified of transfer on 08/18/2014 Name of family member notified:  [name]- Daughter (she called  her father- Charlott Rakes  The following physician request were entered in Epic: Physician Request  Please sign FL2.    Additional Comments: 08/18/14  Ok per MD for d/c today to SNF at [facility]. Nursing notified to call report.  CSW met with pateint and her daughter in patient's room and they were extremely pleased wtih d/c plan. Daughter requested to transport her mother via car and stated that she had a portable 02 tank at home that she would pick up prior to getting patient. Notified Karla at [facility] and patient's nurse- who called report. CSW signing off as no further needs identified. Lorie Phenix. [place], [place]

## 2014-08-22 ENCOUNTER — Encounter: Payer: Self-pay | Admitting: Registered Nurse

## 2014-08-22 NOTE — Progress Notes (Signed)
Patient ID: Susan Mccoy, female   DOB: 1932-04-23, 81 y.o.   MRN: [id number]   Place of Service: [facility]  Allergies  Allergen Reactions  . Morphine And Related Other (See Comments)    Very sensitive to all pain meds-narcotic   . Penicillins Other (See Comments)    REACTION: GI Upset  . Sulfonamide Derivatives Other (See Comments)    Unknown. Symptoms were when she was a teenager  . Celebrex [Celecoxib] Rash  . Valium [Diazepam] Other (See Comments)    Code Status: Full Code  Goals of Care: Longevity/STR  Chief Complaint  Patient presents with  . Hospitalization Follow-up    HPI 81 y.o. female with PMH of pulmonary fibrosis on home O2 at 2lpm, HTN, CKD, anemia, HLD, DJD, esophageal stricture among others is being seen for a follow-up visit post hospital admission from 08/14/14 to 08/18/14 for possible LLL aspiration pneumonia and GERD with esophagitis per EGD on 08/16/14. She's here for short term rehab and her goal is to return home. Seen in room today. Reported having constipation (no BM since 08/18/14)  but does not want strong laxatives. Otherwise, no complaints verbalized by patient.   Review of Systems Constitutional: Negative for fever and chills HENT: Negative for ear pain, congestion, and sore throat Eyes: Negative for eye pain and eye discharge  Cardiovascular: Negative for chest pain, palpitations, and leg swelling Respiratory: Negative cough, shortness of breath, and wheezing.  Gastrointestinal: Negative for nausea and vomiting. Negative for abdominal pain. Positive for constipation Genitourinary: Negative for  dysuria and hematuria Endocrine: Negative for polydipsia, polyphagia, and polyuria Musculoskeletal: Negative for back pain, joint pain, and joint swelling  Neurological: Negative for dizziness and headache.  Skin: Negative for rash and wound.   Psychiatric: Negative for depression  Past Medical History  Diagnosis Date  . Hypertension   .  Thyroid disease   . Abnormal LFTs     Fatty liver by [place]  . Hypercholesterolemia   . Carpal tunnel syndrome   . DJD (degenerative joint disease)   . Shingles   . Meralgia paraesthetica   . GERD (gastroesophageal reflux disease)   . Pulmonary fibrosis   . Hemorrhoids   . Diverticulosis   . Normocytic normochromic anemia   . History of recurrent TIAs   . UTI (lower urinary tract infection)   . Chronic renal disease, stage 4, severely decreased glomerular filtration rate between 15-29 mL/min/1.73 square meter   . Anemia of chronic kidney failure   . Diabetes mellitus without complication     Type 2  . CHF (congestive heart failure)     pt not aware of this  . Bipolar disorder     in the past  . Constipation   . Macular degeneration   . Seizures     questionably had when she had TIA's, takes Primodone, states it has helped with the "shaking"  . Complication of anesthesia     trouble with putting her to sleep - 12/15 - pt doesn't remember this    Past Surgical History  Procedure Laterality Date  . Back surgery      X 2 - lumbar  . Tonsillectomy    . Cataract extraction, bilateral    . Breast biopsy  B/L- said to be negative for malignancy.  . Colonoscopy    . Colonoscopy N/A 08/08/2013    Procedure: COLONOSCOPY;  Surgeon: [name], MD;  Location: [place];  Service: Endoscopy;  Laterality: N/A;  EGD  is possible  . Carpal tunnel release Left   . Eye surgery Bilateral     cataract surgery with lens implant  . Bascilic vein transposition Left 08/03/2014    Procedure:  INSERTION of Left Arm Gortex graft;  Surgeon: Angelia Mould, MD;  Location: [facility] OR;  Service: Vascular;  Laterality: Left;  . Esophagogastroduodenoscopy (egd) with propofol N/A 08/16/2014    Procedure: ESOPHAGOGASTRODUODENOSCOPY (EGD) WITH PROPOFOL;  Surgeon: Cleotis Nipper, MD;  Location: [place];  Service: Endoscopy;  Laterality: N/A;    History  Substance Use Topics  . Smoking status:  Former Smoker -- 2.00 packs/day for 40 years    Quit date: 08/05/1991  . Smokeless tobacco: Never Used  . Alcohol Use: No    Family History  Problem Relation Age of Onset  . Emphysema Father     smoker  . Diabetes Father   . Varicose Veins Father   . Heart disease Father     before age 11  . Heart attack Father   . Emphysema Brother     smoker  . Cancer Brother   . Varicose Veins Mother   . Cancer Sister   . Diabetes Sister   . Cancer Sister   . Cancer Brother       Medication List       This list is accurate as of: 08/21/14 11:59 PM.  Always use your most recent med list.               acetaminophen 500 MG tablet  Commonly known as:  TYLENOL  Take 1,000 mg by mouth every 6 (six) hours as needed for moderate pain.     allopurinol 300 MG tablet  Commonly known as:  ZYLOPRIM  Take 150 mg by mouth daily.     amLODipine 10 MG tablet  Commonly known as:  NORVASC  Take 10 mg by mouth daily.     aspirin EC 81 MG tablet  Take 81 mg by mouth daily.     atenolol 25 MG tablet  Commonly known as:  TENORMIN  Take 25 mg by mouth daily.     calcitRIOL 0.25 MCG capsule  Commonly known as:  ROCALTROL  Take 0.25 mcg by mouth every other day.     cholecalciferol 1000 UNITS tablet  Commonly known as:  VITAMIN D  Take 1,000 Units by mouth daily.     cyclobenzaprine 10 MG tablet  Commonly known as:  FLEXERIL  Take 5 mg by mouth at bedtime.     ferrous sulfate 325 (65 FE) MG tablet  Take 325 mg by mouth daily with breakfast.     hydrOXYzine 25 MG tablet  Commonly known as:  ATARAX/VISTARIL  Take 25 mg by mouth 2 (two) times daily as needed for itching.     insulin lispro 100 UNIT/ML injection  Commonly known as:  HUMALOG  - Inject 2-9 Units into the skin 3 (three) times daily before meals. 2 units for cbg 151-200  - 3 units for cbg 201-250  - 5 units for cbg 251-300  - 7 units for cbg 301-350  - 9 units for cbg 351-400  - Cbg>400 Notify NP/MD      lansoprazole 30 MG capsule  Commonly known as:  PREVACID  Take 30 mg by mouth daily. Before a meal once a day     PRESERVISION AREDS PO  Take 1 capsule by mouth 2 (two) times daily.     primidone 50 MG tablet  Commonly known as:  MYSOLINE  Take 50 mg by mouth 2 (two) times daily.     simvastatin 20 MG tablet  Commonly known as:  ZOCOR  Take 20 mg by mouth at bedtime.     VITAMIN B-12 IJ  Inject 1 Syringe as directed every 30 (thirty) days. As directed. Due 08/19/2013        Physical Exam  BP 168/68 mmHg  Pulse 82  Temp(Src) 97 F (36.1 C)  Resp 20  Ht 5\' 1"  (1.549 m)  Wt 128 lb (58.06 kg)  BMI 24.20 kg/m2  SpO2 96%  Constitutional: WDWN elderly female in no acute distress. Conversant and pleasant HEENT: Normocephalic and atraumatic. PERRL. EOM intact. No icterus. No nasal discharge or sinus tenderness. Oral mucosa moist. Posterior pharynx clear of any exudate or lesions.  Neck: Supple and nontender. No lymphadenopathy, masses, or thyromegaly. No JVD or carotid bruits. Cardiac: Normal S1, S2. RRR without appreciable murmurs, rubs, or gallops. Distal pulses intact.Trace dependent edema Lungs: No respiratory distress. Breath sounds diminished without rales, rhonchi, or wheezes. Abdomen: Audible bowel sounds in all quadrants. Soft, nontender, nondistended. No palpable mass.  Musculoskeletal: Able to move all extremities. Generalized weakness present. No joint erythema or tenderness. Left upper AVF in place with positive bruit and thrill Skin: Warm and dry. No rash noted. No erythema.  Neurological: Alert and oriented to person, place, and time. No focal deficits. Psychiatric: Judgment and insight adequate. Appropriate mood and affect.   Labs Reviewed  CBC Latest Ref Rng 08/18/2014 08/17/2014 08/16/2014  WBC 4.0 - 10.5 K/uL 8.4 9.5 9.4  Hemoglobin 12.0 - 15.0 g/dL 9.1(L) 8.9(L) 9.3(L)  Hematocrit 36.0 - 46.0 % 27.6(L) 27.1(L) 28.9(L)  Platelets 150 - 400 K/uL 221 217 204     CMP Latest Ref Rng 08/18/2014 08/17/2014 08/16/2014  Glucose 70 - 99 mg/dL 109(H) 100(H) 85  BUN 6 - 23 mg/dL 34(H) 37(H) 46(H)  Creatinine 0.50 - 1.10 mg/dL 2.40(H) 2.39(H) 2.41(H)  Sodium 135 - 145 mmol/L 137 141 135  Potassium 3.5 - 5.1 mmol/L 4.6 4.8 4.6  Chloride 96 - 112 mEq/L 109 106 104  CO2 19 - 32 mmol/L 23 23 23   Calcium 8.4 - 10.5 mg/dL 8.6 8.5 8.3(L)  Total Protein 6.0 - 8.3 g/dL - - -  Total Bilirubin 0.3 - 1.2 mg/dL - - -  Alkaline Phos 39 - 117 U/L - - -  AST 0 - 37 U/L - - -  ALT 0 - 35 U/L - - -    Lab Results  Component Value Date   HGBA1C 6.4* 08/07/2013    Lab Results  Component Value Date   TSH 2.750 08/07/2013    Lipid Panel     Component Value Date/Time   CHOL 216* 08/15/2014 0634   TRIG 211* 08/15/2014 0634   HDL 28* 08/15/2014 0634   CHOLHDL 7.7 08/15/2014 0634   VLDL 42* 08/15/2014 0634   LDLCALC 146* 08/15/2014 0634   Diagnostic Studies Reviewed 08/16/14: upper endoscopy: Mid esophageal ulceration, suggestive of pill induced stasis esophagitis, which would readily, for her odynophagia symptoms . Widely patent Schatzki's ring which may or may not be causing any intermittent dysphagia symptoms. Small hiatal hernia  08/14/14: CXR: Densities in the left lower chest that obscure the left hemidiaphragm. Findings are concerning for pneumonia or aspiration. Difficult to exclude fullness in the left hilum. Recommend follow-up to ensure resolution. Patchy interstitial densities in both lungs may represent a combination of chronic changes and low lung  volumes  Assessment & Plan 1. HLD (hyperlipidemia) [id number]. Continue zocor 20mg  daily. Continue to monitor  2. Essential hypertension BP elevated 150s-160s/60s-80s. Will increase norvasc from 5mg  daily to 10mg  daily. Continue atenolol 25mg  daily. Monitor BP and P Qshift x 3 days then daily. Reassess and continue to monitor  3. CKD (chronic kidney disease) stage 4, GFR 15-29 ml/min Has AVF in place.   Continue calcitrol 0.25mg  every other day. Continue renal diet and f/u with nephrology, Dr. Mercy Moore as outpatient. Continue to monitor her status  4. Anemia in chronic kidney disease Stable. Recent hgb/hct 9.1/27.6. Continue ferrous sulfate 325mg  daily. Continue to monitor h&h  5. Gastroesophageal reflux disease with esophagitis Continue prevacid 30mg  daily and monitor.   6. Generalized weakness Continue to work with [facility] PT/OT for gait/strength/balance training to restore/maximize function. Fall risk precautions. Continue to monitor  7. Type 2 diabetes mellitus with complication Recent D5H 6.4. Home regimen glipizide was d/c secondary to hypoglycemia during hospitalization. Continue Humalog ssi three times daily before meals: 3 units for cbg 201-250, 5 units for cbg 251-300, 7 units for cbg 301-350, and 9 units for cbg 351-400. Nursin staff to notify for cbg>400. Continue to monitor cbgs and will adjust her diabetic med as indicated.   8. Aspiration Pneumonia Continue and complete augmentin 500/325mg  daily. Receive her last dose today. MBSS performed 08/16/14 and speech therapy recommended a regular diet and thin liquids. Continue Renal/CCD. Continue aspiration precautions and monitor.  9. Constipation Start colace 100mg  twice daily. Encourage hydration and ambulation as tolerated. Continue to monitor   Diagnostic Studies/Labs Ordered: cbc, bmp  Time: 50 minutes on care coordination and counseling   Family/Staff Communication Plan of care discussed with patient and nursing staff. Patient and nursing staff verbalized understanding and agree with plan of care. No additional questions or concerns reported.    Arthur Holms, MSN, AGNP-C [facility] [address] [place], [place] [address] [phone] [8am-5pm] After hours: [phone]

## 2014-08-23 ENCOUNTER — Non-Acute Institutional Stay (SKILLED_NURSING_FACILITY): Payer: Medicare Other | Admitting: Internal Medicine

## 2014-08-23 DIAGNOSIS — N189 Chronic kidney disease, unspecified: Secondary | ICD-10-CM

## 2014-08-23 DIAGNOSIS — K219 Gastro-esophageal reflux disease without esophagitis: Secondary | ICD-10-CM

## 2014-08-23 DIAGNOSIS — R5381 Other malaise: Secondary | ICD-10-CM

## 2014-08-23 DIAGNOSIS — E7800 Pure hypercholesterolemia, unspecified: Secondary | ICD-10-CM

## 2014-08-23 DIAGNOSIS — E1122 Type 2 diabetes mellitus with diabetic chronic kidney disease: Secondary | ICD-10-CM

## 2014-08-23 DIAGNOSIS — K5900 Constipation, unspecified: Secondary | ICD-10-CM

## 2014-08-23 DIAGNOSIS — D638 Anemia in other chronic diseases classified elsewhere: Secondary | ICD-10-CM

## 2014-08-23 DIAGNOSIS — N184 Chronic kidney disease, stage 4 (severe): Secondary | ICD-10-CM

## 2014-08-23 DIAGNOSIS — J690 Pneumonitis due to inhalation of food and vomit: Secondary | ICD-10-CM

## 2014-08-23 DIAGNOSIS — I10 Essential (primary) hypertension: Secondary | ICD-10-CM

## 2014-08-23 NOTE — Progress Notes (Signed)
Patient ID: Susan Mccoy, female   DOB: 03/30/1932, 81 y.o.   MRN: [id number]     Facility: [facility]    PCP: Henrine Screws, MD  Code Status: full code  Allergies  Allergen Reactions  . Morphine And Related Other (See Comments)    Very sensitive to all pain meds-narcotic   . Penicillins Other (See Comments)    REACTION: GI Upset  . Sulfonamide Derivatives Other (See Comments)    Unknown. Symptoms were when she was a teenager  . Celebrex [Celecoxib] Rash  . Valium [Diazepam] Other (See Comments)    Chief Complaint  Patient presents with  . New Admit To SNF     HPI:  81 year old patient is here for short term rehabilitation post hospital admission from 08/14/14 to 08/18/14 with left lower lobe aspiration pneumonia and GERD with esophagitis seen in EGD on 08/16/14. She was also treated for e.coli UTI. She is seen in her room today. She had a bowel movement yesterday. She has worked with therapy team today and is waiting for her beautician. Her breathing is stable and she is on chronic oxygen. Denies any heartburn, nausea or vomiting. Has minimal dry cough. On review of her vital signs, er bp has been elevated in the facility.  She has PMH of pulmonary fibrosis on home O2 2l/min, HTN, CKD stage 4, esophageal stricture, HLD, anemia among others.  Review of Systems:  Constitutional: Negative for fever, chills, diaphoresis.  HENT: Negative for headache, congestion Respiratory: Negative for shortness of breath and wheezing.   Cardiovascular: Negative for chest pain, palpitations, leg swelling.  Gastrointestinal: Negative for heartburn, nausea, vomiting, abdominal pain Genitourinary: Negative for dysuria.  Musculoskeletal: Negative for falls Skin: Negative for itching, rash.  Neurological: Negative for dizziness, tingling, focal weakness Psychiatric/Behavioral: Negative for depression.    Past Medical History  Diagnosis Date  . Hypertension   .  Thyroid disease   . Abnormal LFTs     Fatty liver by [place]  . Hypercholesterolemia   . Carpal tunnel syndrome   . DJD (degenerative joint disease)   . Shingles   . Meralgia paraesthetica   . GERD (gastroesophageal reflux disease)   . Pulmonary fibrosis   . Hemorrhoids   . Diverticulosis   . Normocytic normochromic anemia   . History of recurrent TIAs   . UTI (lower urinary tract infection)   . Chronic renal disease, stage 4, severely decreased glomerular filtration rate between 15-29 mL/min/1.73 square meter   . Anemia of chronic kidney failure   . Diabetes mellitus without complication     Type 2  . CHF (congestive heart failure)     pt not aware of this  . Bipolar disorder     in the past  . Constipation   . Macular degeneration   . Seizures     questionably had when she had TIA's, takes Primodone, states it has helped with the "shaking"  . Complication of anesthesia     trouble with putting her to sleep - 12/15 - pt doesn't remember this   Past Surgical History  Procedure Laterality Date  . Back surgery      X 2 - lumbar  . Tonsillectomy    . Cataract extraction, bilateral    . Breast biopsy  B/L- said to be negative for malignancy.  . Colonoscopy    . Colonoscopy N/A 08/08/2013    Procedure: COLONOSCOPY;  Surgeon: [name], MD;  Location: [place];  Service: Endoscopy;  Laterality: N/A;  EGD is possible  . Carpal tunnel release Left   . Eye surgery Bilateral     cataract surgery with lens implant  . Bascilic vein transposition Left 08/03/2014    Procedure:  INSERTION of Left Arm Gortex graft;  Surgeon: Angelia Mould, MD;  Location: [facility];  Service: Vascular;  Laterality: Left;  . Esophagogastroduodenoscopy (egd) with propofol N/A 08/16/2014    Procedure: ESOPHAGOGASTRODUODENOSCOPY (EGD) WITH PROPOFOL;  Surgeon: Cleotis Nipper, MD;  Location: [place];  Service: Endoscopy;  Laterality: N/A;   Social History:   reports that she quit smoking about  23 years ago. She has never used smokeless tobacco. She reports that she does not drink alcohol or use illicit drugs.  Family History  Problem Relation Age of Onset  . Emphysema Father     smoker  . Diabetes Father   . Varicose Veins Father   . Heart disease Father     before age 44  . Heart attack Father   . Emphysema Brother     smoker  . Cancer Brother   . Varicose Veins Mother   . Cancer Sister   . Diabetes Sister   . Cancer Sister   . Cancer Brother     Medications: Patient's Medications  New Prescriptions   No medications on file  Previous Medications   ACETAMINOPHEN (TYLENOL) 500 MG TABLET    Take 1,000 mg by mouth every 6 (six) hours as needed for moderate pain.    ALLOPURINOL (ZYLOPRIM) 300 MG TABLET    Take 150 mg by mouth daily.   AMLODIPINE (NORVASC) 10 MG TABLET    Take 10 mg by mouth daily.   ASPIRIN EC 81 MG TABLET    Take 81 mg by mouth daily.   ATENOLOL (TENORMIN) 25 MG TABLET    Take 25 mg by mouth daily.    CALCITRIOL (ROCALTROL) 0.25 MCG CAPSULE    Take 0.25 mcg by mouth every other day.   CHOLECALCIFEROL (VITAMIN D) 1000 UNITS TABLET    Take 1,000 Units by mouth daily.   CYANOCOBALAMIN (VITAMIN B-12 IJ)    Inject 1 Syringe as directed every 30 (thirty) days. As directed. Due 08/19/2013   CYCLOBENZAPRINE (FLEXERIL) 10 MG TABLET    Take 5 mg by mouth at bedtime.   FERROUS SULFATE 325 (65 FE) MG TABLET    Take 325 mg by mouth daily with breakfast.   HYDROXYZINE (ATARAX/VISTARIL) 25 MG TABLET    Take 25 mg by mouth 2 (two) times daily as needed for itching.    INSULIN LISPRO (HUMALOG) 100 UNIT/ML INJECTION    Inject 2-9 Units into the skin 3 (three) times daily before meals. 2 units for cbg 151-200 3 units for cbg 201-250 5 units for cbg 251-300 7 units for cbg 301-350 9 units for cbg 351-400 Cbg>400 Notify NP/MD   LANSOPRAZOLE (PREVACID) 30 MG CAPSULE    Take 30 mg by mouth daily. Before a meal once a day   MULTIPLE VITAMINS-MINERALS (PRESERVISION AREDS  PO)    Take 1 capsule by mouth 2 (two) times daily.   PRIMIDONE (MYSOLINE) 50 MG TABLET    Take 50 mg by mouth 2 (two) times daily.   SIMVASTATIN (ZOCOR) 20 MG TABLET    Take 20 mg by mouth at bedtime.   Modified Medications   No medications on file  Discontinued Medications   No medications on file     Physical Exam: Filed Vitals:  08/23/14 2022  BP: 193/85  Pulse: 84  Temp: 97.2 F (36.2 C)  Resp: 18  SpO2: 97%    General- elderly female, well built, in no acute distress Head- normocephalic, atraumatic Throat- moist mucus membrane Neck- no cervical lymphadenopathy Cardiovascular- normal s1,s2, no murmurs, no leg edema Respiratory- decreased air entry at lung bases left> right, no wheeze, no rhonchi, no crackles, no use of accessory muscles, on o2 Abdomen- bowel sounds present, soft, non tender Musculoskeletal- able to move all 4 extremities, normal range of motion, generalized weakness. Left upper arm AVF with good thrill Neurological- no focal deficit Skin- warm and dry Psychiatry- alert and oriented to person, place and time, normal mood and affect   Labs reviewed: Basic Metabolic Panel:  Recent Labs  08/16/14 0120 08/17/14 0439 08/18/14 0535  NA 135 141 137  K 4.6 4.8 4.6  CL 104 106 109  CO2 23 23 23   GLUCOSE 85 100* 109*  BUN 46* 37* 34*  CREATININE 2.41* 2.39* 2.40*  CALCIUM 8.3* 8.5 8.6   Liver Function Tests:  Recent Labs  08/15/14 0634  AST 28  ALT 31  ALKPHOS 88  BILITOT 0.7  PROT 7.0  ALBUMIN 3.2*   No results for input(s): LIPASE, AMYLASE in the last 8760 hours. No results for input(s): AMMONIA in the last 8760 hours. CBC:  Recent Labs  08/15/14 0634 08/16/14 0120 08/17/14 0439 08/18/14 0535  WBC 11.7* 9.4 9.5 8.4  NEUTROABS 6.1  --   --   --   HGB 9.8* 9.3* 8.9* 9.1*  HCT 30.5* 28.9* 27.1* 27.6*  MCV 98.1 101.0* 98.5 100.0  PLT 255 204 217 221   Cardiac Enzymes:  Recent Labs  08/15/14 2014 08/16/14 0120  08/16/14 0727  TROPONINI <0.03 <0.03 <0.03   BNP: Invalid input(s): POCBNP CBG:  Recent Labs  08/17/14 2114 08/18/14 0643 08/18/14 1156  GLUCAP 201* 107* 180*    Radiological Exams: 08/16/14: upper endoscopy: Mid esophageal ulceration, suggestive of pill induced stasis esophagitis, which would readily, for her odynophagia symptoms . Widely patent Schatzki's ring which may or may not be causing any intermittent dysphagia symptoms. Small hiatal hernia  08/14/14: CXR: Densities in the left lower chest that obscure the left hemidiaphragm. Findings are concerning for pneumonia or aspiration. Difficult to exclude fullness in the left hilum. Recommend follow-up to ensure resolution. Patchy interstitial densities in both lungs may represent a combination of chronic changes and low lung volumes   Assessment/Plan  Physical deconditioning Will have her work with physical therapy and occupational therapy team to help with gait training and muscle strengthening exercises.fall precautions. Skin care. Encourage to be out of bed.   Uncontrolled hypertension Her norvasc was increased in the facility to 10 mg daily, bp on review remains elevated. Introduce hydralazine 10 mg tid and adjust dosing further if bp remains elevated on 08/28/14 bp review. Continue atenolol  Pneumonia Has completed her antibiotics, monitor clinically, aspiration precautions  Anemia of chronic disease Continue ferrous sulfate and monitor h&h  ckd stage 4 F/u with renal, continue calcitriol and has AVF in place  Constipation Continue colace 100 mg bid, monitor  Type 2 diabetes mellitus  Recent A1c 6.4. Continue Humalog ssi three times daily before meals and monitor cbg. Continue statin, aspirin.  hyperlipidemia Continue zocor 20mg  daily.   gerd Continue prevacid 30mg  daily and monitor.    Goals of care: short term rehabilitation   Labs/tests ordered- cbc, bmp  Family/ staff Communication: reviewed care plan  with  patient and nursing supervisor    Blanchie Serve, MD  [facility] [phone] (Monday-Friday 8 am - 5 pm) [phone] (afterhours)

## 2014-09-02 ENCOUNTER — Encounter (HOSPITAL_COMMUNITY): Payer: Self-pay | Admitting: *Deleted

## 2014-09-02 ENCOUNTER — Emergency Department (HOSPITAL_COMMUNITY)
Admission: EM | Admit: 2014-09-02 | Discharge: 2014-09-03 | Disposition: A | Discharge status: Home / Self Care | Payer: Medicare Other | Attending: Emergency Medicine | Admitting: Emergency Medicine

## 2014-09-02 DIAGNOSIS — Z8619 Personal history of other infectious and parasitic diseases: Secondary | ICD-10-CM | POA: Insufficient documentation

## 2014-09-02 DIAGNOSIS — M1990 Unspecified osteoarthritis, unspecified site: Secondary | ICD-10-CM | POA: Insufficient documentation

## 2014-09-02 DIAGNOSIS — Z87891 Personal history of nicotine dependence: Secondary | ICD-10-CM | POA: Diagnosis not present

## 2014-09-02 DIAGNOSIS — Z8673 Personal history of transient ischemic attack (TIA), and cerebral infarction without residual deficits: Secondary | ICD-10-CM | POA: Insufficient documentation

## 2014-09-02 DIAGNOSIS — G40909 Epilepsy, unspecified, not intractable, without status epilepticus: Secondary | ICD-10-CM | POA: Diagnosis not present

## 2014-09-02 DIAGNOSIS — F4323 Adjustment disorder with mixed anxiety and depressed mood: Secondary | ICD-10-CM | POA: Diagnosis not present

## 2014-09-02 DIAGNOSIS — Z7982 Long term (current) use of aspirin: Secondary | ICD-10-CM | POA: Insufficient documentation

## 2014-09-02 DIAGNOSIS — Z792 Long term (current) use of antibiotics: Secondary | ICD-10-CM | POA: Diagnosis not present

## 2014-09-02 DIAGNOSIS — I509 Heart failure, unspecified: Secondary | ICD-10-CM | POA: Insufficient documentation

## 2014-09-02 DIAGNOSIS — F4322 Adjustment disorder with anxiety: Secondary | ICD-10-CM

## 2014-09-02 DIAGNOSIS — K219 Gastro-esophageal reflux disease without esophagitis: Secondary | ICD-10-CM | POA: Insufficient documentation

## 2014-09-02 DIAGNOSIS — Z880 Allergy status to penicillin: Secondary | ICD-10-CM | POA: Diagnosis not present

## 2014-09-02 DIAGNOSIS — F319 Bipolar disorder, unspecified: Secondary | ICD-10-CM | POA: Diagnosis not present

## 2014-09-02 DIAGNOSIS — Z79899 Other long term (current) drug therapy: Secondary | ICD-10-CM | POA: Diagnosis not present

## 2014-09-02 DIAGNOSIS — N184 Chronic kidney disease, stage 4 (severe): Secondary | ICD-10-CM | POA: Insufficient documentation

## 2014-09-02 DIAGNOSIS — Z87440 Personal history of urinary (tract) infections: Secondary | ICD-10-CM | POA: Diagnosis not present

## 2014-09-02 DIAGNOSIS — E119 Type 2 diabetes mellitus without complications: Secondary | ICD-10-CM | POA: Insufficient documentation

## 2014-09-02 DIAGNOSIS — I129 Hypertensive chronic kidney disease with stage 1 through stage 4 chronic kidney disease, or unspecified chronic kidney disease: Secondary | ICD-10-CM | POA: Diagnosis not present

## 2014-09-02 DIAGNOSIS — E780 Pure hypercholesterolemia: Secondary | ICD-10-CM | POA: Insufficient documentation

## 2014-09-02 DIAGNOSIS — D631 Anemia in chronic kidney disease: Secondary | ICD-10-CM | POA: Insufficient documentation

## 2014-09-02 DIAGNOSIS — Z048 Encounter for examination and observation for other specified reasons: Secondary | ICD-10-CM | POA: Diagnosis present

## 2014-09-02 LAB — RAPID URINE DRUG SCREEN, HOSP PERFORMED
Amphetamines: NOT DETECTED
BARBITURATES: POSITIVE — AB
BENZODIAZEPINES: NOT DETECTED
COCAINE: NOT DETECTED
OPIATES: NOT DETECTED
Tetrahydrocannabinol: NOT DETECTED

## 2014-09-02 LAB — CBC
HCT: 30.7 % — ABNORMAL LOW (ref 36.0–46.0)
Hemoglobin: 10 g/dL — ABNORMAL LOW (ref 12.0–15.0)
MCH: 32.3 pg (ref 26.0–34.0)
MCHC: 32.6 g/dL (ref 30.0–36.0)
MCV: 99 fL (ref 78.0–100.0)
Platelets: 273 10*3/uL (ref 150–400)
RBC: 3.1 MIL/uL — ABNORMAL LOW (ref 3.87–5.11)
RDW: 14.4 % (ref 11.5–15.5)
WBC: 12 10*3/uL — ABNORMAL HIGH (ref 4.0–10.5)

## 2014-09-02 LAB — COMPREHENSIVE METABOLIC PANEL
ALK PHOS: 114 U/L (ref 39–117)
ALT: 21 U/L (ref 0–35)
AST: 20 U/L (ref 0–37)
Albumin: 3.7 g/dL (ref 3.5–5.2)
Anion gap: 10 (ref 5–15)
BILIRUBIN TOTAL: 0.3 mg/dL (ref 0.3–1.2)
BUN: 71 mg/dL — ABNORMAL HIGH (ref 6–23)
CALCIUM: 8.7 mg/dL (ref 8.4–10.5)
CHLORIDE: 102 mmol/L (ref 96–112)
CO2: 23 mmol/L (ref 19–32)
Creatinine, Ser: 2.64 mg/dL — ABNORMAL HIGH (ref 0.50–1.10)
GFR calc Af Amer: 19 mL/min — ABNORMAL LOW (ref >90)
GFR calc non Af Amer: 16 mL/min — ABNORMAL LOW (ref >90)
GLUCOSE: 147 mg/dL — AB (ref 70–99)
Potassium: 4.3 mmol/L (ref 3.5–5.1)
SODIUM: 135 mmol/L (ref 135–145)
TOTAL PROTEIN: 7.7 g/dL (ref 6.0–8.3)

## 2014-09-02 LAB — ETHANOL

## 2014-09-02 LAB — SALICYLATE LEVEL

## 2014-09-02 LAB — ACETAMINOPHEN LEVEL: Acetaminophen (Tylenol), Serum: <10 ug/mL — ABNORMAL LOW (ref 10–30)

## 2014-09-02 MED ORDER — ALLOPURINOL 150 MG HALF TABLET
150.0000 mg | ORAL_TABLET | Freq: Every day | ORAL | Status: DC
  Administered 2014-09-03: 150 mg via ORAL
  Filled 2014-09-02: qty 1

## 2014-09-02 MED ORDER — CYCLOBENZAPRINE HCL 10 MG PO TABS
5.0000 mg | ORAL_TABLET | Freq: Once | ORAL | Status: AC
  Administered 2014-09-02: 5 mg via ORAL
  Filled 2014-09-02: qty 1

## 2014-09-02 MED ORDER — PRIMIDONE 50 MG PO TABS
50.0000 mg | ORAL_TABLET | Freq: Two times a day (BID) | ORAL | Status: DC
  Administered 2014-09-03: 50 mg via ORAL
  Filled 2014-09-02 (×3): qty 1

## 2014-09-02 MED ORDER — PANTOPRAZOLE SODIUM 20 MG PO TBEC
20.0000 mg | DELAYED_RELEASE_TABLET | Freq: Every day | ORAL | Status: DC
  Administered 2014-09-03: 20 mg via ORAL
  Filled 2014-09-02: qty 1

## 2014-09-02 MED ORDER — ATENOLOL 25 MG PO TABS
25.0000 mg | ORAL_TABLET | Freq: Every day | ORAL | Status: DC
  Administered 2014-09-03: 25 mg via ORAL
  Filled 2014-09-02 (×2): qty 1

## 2014-09-02 MED ORDER — HYDROCODONE-ACETAMINOPHEN 5-325 MG PO TABS: 1.0000 | ORAL_TABLET | Freq: Four times a day (QID) | ORAL | Status: DC | PRN

## 2014-09-02 MED ORDER — TORSEMIDE 20 MG PO TABS
20.0000 mg | ORAL_TABLET | Freq: Every day | ORAL | Status: DC
  Administered 2014-09-03: 20 mg via ORAL
  Filled 2014-09-02 (×2): qty 1

## 2014-09-02 MED ORDER — ASPIRIN EC 81 MG PO TBEC
81.0000 mg | DELAYED_RELEASE_TABLET | Freq: Every day | ORAL | Status: DC
  Administered 2014-09-03: 81 mg via ORAL
  Filled 2014-09-02: qty 1

## 2014-09-02 MED ORDER — CYCLOBENZAPRINE HCL 10 MG PO TABS: 5.0000 mg | ORAL_TABLET | Freq: Every day | ORAL | Status: DC

## 2014-09-02 MED ORDER — SIMVASTATIN 20 MG PO TABS
20.0000 mg | ORAL_TABLET | Freq: Every day | ORAL | Status: DC
  Filled 2014-09-02 (×2): qty 1

## 2014-09-02 MED ORDER — AMLODIPINE BESYLATE 10 MG PO TABS
10.0000 mg | ORAL_TABLET | Freq: Every day | ORAL | Status: DC
  Administered 2014-09-03: 10 mg via ORAL
  Filled 2014-09-02: qty 1

## 2014-09-02 MED ORDER — GLIPIZIDE 10 MG PO TABS
20.0000 mg | ORAL_TABLET | Freq: Every day | ORAL | Status: DC
  Administered 2014-09-03: 20 mg via ORAL
  Filled 2014-09-02 (×2): qty 2

## 2014-09-02 MED ORDER — CALCITRIOL 0.25 MCG PO CAPS
0.2500 ug | ORAL_CAPSULE | ORAL | Status: DC
  Administered 2014-09-03: 0.25 ug via ORAL
  Filled 2014-09-02: qty 1

## 2014-09-02 NOTE — BH Assessment (Signed)
Tele Assessment Note   Susan Mccoy is an 81 y.o. female presenting to [facility] after being petitioned by her daughter. Pt stated "someone thought I was crazy and had me brought to the hospital but I am okay other than some aches and pains". Pt denied SI, HI and AVH at this time. When pt was asked about SI pt stated "oh no, I am fighting to live" and shared that she recently had a shunt placed in her arm for dialysis. Pt did not report any previous suicide attempts or psychiatric hospitalizations. Pt is currently not receiving mental health treatment. Pt did not report any depressive symptoms at this time and stated that the only stressor she has is the caregiver that is overbearing. Pt stated "she thinks that she has to explain everything to me and she will run to my daughter with everything". "I don't like her". "She will talk down to me like she has to explain everything to me, I have been doing this for several years". She did not report any issues with her sleep and shared that her appetite is fair. Pt did not report any significant weight changes; however she did shared that her weight has changed since she is no longer working. Pt reported that there are firearms in the home but stated "I wouldn't dare touch them". Pt did not report any drug or illicit substance abuse at this time.  Collateral information was gathered from the petitioner (pt's daughter) who reported that pt has been confused and forgetful. She reported that pt has become combative and accused one of her caregivers of stealing her lotion. She also reported that pt does not like one of the caregivers and refuses to let her care for her. She shared that on last night pt refused to take her medication. She shared that pt took her medication today. She also shared that she is concern for her mother's safety due to her being a fall risk. She reported that pt will get dizzy and refuses to wear her oxygen at times. She reported that pt has been  knit picking with her caregivers. She also expressed some concern about her father having a stroke due to pt's behaviors. Pt's daughter reported that she is unsure if her mother has a dementia diagnosis but would like to have some clarification.  A social work  consult has been recommended.    Axis I: Adjustment Disorder with Disturbance of Conduct  Past Medical History:  Past Medical History  Diagnosis Date  . Hypertension   . Thyroid disease   . Abnormal LFTs     Fatty liver by [place]  . Hypercholesterolemia   . Carpal tunnel syndrome   . DJD (degenerative joint disease)   . Shingles   . Meralgia paraesthetica   . GERD (gastroesophageal reflux disease)   . Pulmonary fibrosis   . Hemorrhoids   . Diverticulosis   . Normocytic normochromic anemia   . History of recurrent TIAs   . UTI (lower urinary tract infection)   . Chronic renal disease, stage 4, severely decreased glomerular filtration rate between 15-29 mL/min/1.73 square meter   . Anemia of chronic kidney failure   . Diabetes mellitus without complication     Type 2  . CHF (congestive heart failure)     pt not aware of this  . Bipolar disorder     in the past  . Constipation   . Macular degeneration   . Seizures     questionably had  when she had TIA's, takes Primodone, states it has helped with the "shaking"  . Complication of anesthesia     trouble with putting her to sleep - 12/15 - pt doesn't remember this    Past Surgical History  Procedure Laterality Date  . Back surgery      X 2 - lumbar  . Tonsillectomy    . Cataract extraction, bilateral    . Breast biopsy  B/L- said to be negative for malignancy.  . Colonoscopy    . Colonoscopy N/A 08/08/2013    Procedure: COLONOSCOPY;  Surgeon: [name], MD;  Location: [place];  Service: Endoscopy;  Laterality: N/A;  EGD is possible  . Carpal tunnel release Left   . Eye surgery Bilateral     cataract surgery with lens implant  . Bascilic vein transposition  Left 08/03/2014    Procedure:  INSERTION of Left Arm Gortex graft;  Surgeon: Angelia Mould, MD;  Location: [facility] OR;  Service: Vascular;  Laterality: Left;  . Esophagogastroduodenoscopy (egd) with propofol N/A 08/16/2014    Procedure: ESOPHAGOGASTRODUODENOSCOPY (EGD) WITH PROPOFOL;  Surgeon: Cleotis Nipper, MD;  Location: [place];  Service: Endoscopy;  Laterality: N/A;    Family History:  Family History  Problem Relation Age of Onset  . Emphysema Father     smoker  . Diabetes Father   . Varicose Veins Father   . Heart disease Father     before age 52  . Heart attack Father   . Emphysema Brother     smoker  . Cancer Brother   . Varicose Veins Mother   . Cancer Sister   . Diabetes Sister   . Cancer Sister   . Cancer Brother     Social History:  reports that she quit smoking about 23 years ago. She has never used smokeless tobacco. She reports that she does not drink alcohol or use illicit drugs.  Additional Social History:  Alcohol / Drug Use History of alcohol / drug use?: No history of alcohol / drug abuse  CIWA: CIWA-Ar BP: 186/67 mmHg Pulse Rate: 81 COWS:    PATIENT STRENGTHS: (choose at least two) Average or above average intelligence Supportive family/friends  Allergies:  Allergies  Allergen Reactions  . Morphine And Related Other (See Comments)    Very sensitive to all pain meds-narcotic   . Penicillins Other (See Comments)    REACTION: GI Upset  . Sulfonamide Derivatives Other (See Comments)    Unknown. Symptoms were when she was a teenager  . Celebrex [Celecoxib] Rash  . Valium [Diazepam] Other (See Comments)    Home Medications:  (Not in a hospital admission)  OB/GYN Status:  No LMP recorded. Patient is postmenopausal.  General Assessment Data Location of Assessment: [facility] Is this a Tele or Face-to-Face Assessment?: Face-to-Face Is this an Initial Assessment or a Re-assessment for this encounter?: Initial Assessment Living Arrangements:  Spouse/significant other Can pt return to current living arrangement?: Yes Admission Status: Involuntary Is patient capable of signing voluntary admission?: No Transfer from: Home Referral Source: Self/Family/Friend     [place] Living Arrangements: Spouse/significant other Name of Psychiatrist: No provider reported at this time. Name of Therapist: No provider reported at this time.  Education Status Is patient currently in school?: No  Risk to self with the past 6 months Suicidal Ideation: No Suicidal Intent: No Is patient at risk for suicide?: No Suicidal Plan?: No Access to Means: No What has been your use of  drugs/alcohol within the last 12 months?: No alcohol or drug use reported. Previous Attempts/Gestures: No How many times?: 0 Other Self Harm Risks: No self harm risk identified at this time. Triggers for Past Attempts: None known Intentional Self Injurious Behavior: None Family Suicide History: No Recent stressful life event(s): Conflict (Comment) (Pt reported that she has conflict with a caregiver.) Persecutory voices/beliefs?: No Depression: No Depression Symptoms:  (No symptoms reported.) Substance abuse history and/or treatment for substance abuse?: No Suicide prevention information given to non-admitted patients: Not applicable  Risk to Others within the past 6 months Homicidal Ideation: No Thoughts of Harm to Others: No Current Homicidal Intent: No Current Homicidal Plan: No Access to Homicidal Means: No Identified Victim: NA History of harm to others?: No Assessment of Violence: None Noted Violent Behavior Description: No violent violent behaviors observed at this time.  Does patient have access to weapons?: Yes (Comment) ("I wouldn't dare touch them". ) Criminal Charges Pending?: No Does patient have a court date: No  Psychosis Hallucinations: None noted Delusions: None noted  Mental Status Report Appear/Hygiene: Unremarkable Eye  Contact: Good Speech: Logical/coherent Level of Consciousness: Quiet/awake Mood: Euthymic Affect: Appropriate to circumstance Anxiety Level: None Thought Processes: Coherent, Relevant Judgement: Unimpaired Orientation: Person, Place, Time, Situation Obsessive Compulsive Thoughts/Behaviors: None  Cognitive Functioning Concentration: Normal Memory: Recent Intact IQ: Average Insight: Fair Impulse Control: Good Appetite: Fair Weight Loss: 0 Weight Gain: 0 Sleep: No Change Total Hours of Sleep: 8 Vegetative Symptoms: None  ADLScreening [facility] Assessment Services) Patient's cognitive ability adequate to safely complete daily activities?: Yes Patient able to express need for assistance with ADLs?: Yes Independently performs ADLs?: No  Prior Inpatient Therapy Prior Inpatient Therapy: No  Prior Outpatient Therapy Prior Outpatient Therapy: No  ADL Screening (condition at time of admission) Patient's cognitive ability adequate to safely complete daily activities?: Yes Is the patient deaf or have difficulty hearing?: No Does the patient have difficulty seeing, even when wearing glasses/contacts?: No Does the patient have difficulty concentrating, remembering, or making decisions?: No Patient able to express need for assistance with ADLs?: Yes Does the patient have difficulty dressing or bathing?: No Independently performs ADLs?: No       Abuse/Neglect Assessment (Assessment to be complete while patient is alone) Physical Abuse: Denies Verbal Abuse: Denies Sexual Abuse: Denies Exploitation of patient/patient's resources: Denies Self-Neglect: Denies     Regulatory affairs officer (For Healthcare) Does patient have an advance directive?: Yes Type of Advance Directive: [place], Living will    Additional Information 1:1 In Past 12 Months?: No CIRT Risk: No Elopement Risk: No Does patient have medical clearance?: Yes     Disposition:  Disposition Initial  Assessment Completed for this Encounter: Yes Disposition of Patient: Other dispositions Other disposition(s): Other (Comment) (Social work consult )  Michaela Shankel S 09/02/2014 10:46 PM

## 2014-09-02 NOTE — ED Provider Notes (Signed)
CSN: [id number]     Arrival date & time 09/02/14  1729 History   First MD Initiated Contact with Patient 09/02/14 1806     Chief Complaint  Patient presents with  . Medical Clearance      HPI Pt in from home. Daughter from [place] pt. Papers state pt has dementia and is uncooperative with her in home caregivers and unable to care for herself. Pt reports she is fine with her caregivers, except for one and she "was ugly" to her last night, telling her to leave her alone because she wanted to get dressed on her own. Pt feels that she is fine at home with her help other than that one aide. EMS reported that pt threw a walker last night which pt reports she does not remember doing. Pt does admit to some mild memory issues but does not feel this affects her caring for herself. Pt lives with her husband who receives care from the same 3 aides, caregiver present 24hrs/day.  Past Medical History  Diagnosis Date  . Hypertension   . Thyroid disease   . Abnormal LFTs     Fatty liver by [place]  . Hypercholesterolemia   . Carpal tunnel syndrome   . DJD (degenerative joint disease)   . Shingles   . Meralgia paraesthetica   . GERD (gastroesophageal reflux disease)   . Pulmonary fibrosis   . Hemorrhoids   . Diverticulosis   . Normocytic normochromic anemia   . History of recurrent TIAs   . UTI (lower urinary tract infection)   . Chronic renal disease, stage 4, severely decreased glomerular filtration rate between 15-29 mL/min/1.73 square meter   . Anemia of chronic kidney failure   . Diabetes mellitus without complication     Type 2  . CHF (congestive heart failure)     pt not aware of this  . Bipolar disorder     in the past  . Constipation   . Macular degeneration   . Seizures     questionably had when she had TIA's, takes Primodone, states it has helped with the "shaking"  . Complication of anesthesia     trouble with putting her to sleep - 12/15 - pt doesn't remember this   Past  Surgical History  Procedure Laterality Date  . Back surgery      X 2 - lumbar  . Tonsillectomy    . Cataract extraction, bilateral    . Breast biopsy  B/L- said to be negative for malignancy.  . Colonoscopy    . Colonoscopy N/A 08/08/2013    Procedure: COLONOSCOPY;  Surgeon: [name], MD;  Location: [place];  Service: Endoscopy;  Laterality: N/A;  EGD is possible  . Carpal tunnel release Left   . Eye surgery Bilateral     cataract surgery with lens implant  . Bascilic vein transposition Left 08/03/2014    Procedure:  INSERTION of Left Arm Gortex graft;  Surgeon: Angelia Mould, MD;  Location: [facility] OR;  Service: Vascular;  Laterality: Left;  . Esophagogastroduodenoscopy (egd) with propofol N/A 08/16/2014    Procedure: ESOPHAGOGASTRODUODENOSCOPY (EGD) WITH PROPOFOL;  Surgeon: Cleotis Nipper, MD;  Location: [place];  Service: Endoscopy;  Laterality: N/A;   Family History  Problem Relation Age of Onset  . Emphysema Father     smoker  . Diabetes Father   . Varicose Veins Father   . Heart disease Father     before age 46  . Heart attack  Father   . Emphysema Brother     smoker  . Cancer Brother   . Varicose Veins Mother   . Cancer Sister   . Diabetes Sister   . Cancer Sister   . Cancer Brother    History  Substance Use Topics  . Smoking status: Former Smoker -- 2.00 packs/day for 40 years    Quit date: 08/05/1991  . Smokeless tobacco: Never Used  . Alcohol Use: No   OB History    No data available     Review of Systems  All other systems reviewed and are negative  Allergies  Morphine and related; Penicillins; Sulfonamide derivatives; Celebrex; and Valium  Home Medications   Prior to Admission medications   Medication Sig Start Date End Date Taking? Authorizing Provider  acetaminophen (TYLENOL) 500 MG tablet Take 500 mg by mouth every 6 (six) hours as needed for moderate pain (pain).    Yes Historical Provider, MD  allopurinol (ZYLOPRIM) 300 MG  tablet Take 150 mg by mouth daily.   Yes Historical Provider, MD  amLODipine (NORVASC) 10 MG tablet Take 10 mg by mouth daily.   Yes Historical Provider, MD  aspirin EC 81 MG tablet Take 81 mg by mouth daily.   Yes Historical Provider, MD  atenolol (TENORMIN) 25 MG tablet Take 25 mg by mouth daily.    Yes Historical Provider, MD  calcitRIOL (ROCALTROL) 0.25 MCG capsule Take 0.25 mcg by mouth every other day.   Yes Historical Provider, MD  cholecalciferol (VITAMIN D) 1000 UNITS tablet Take 1,000 Units by mouth daily.   Yes Historical Provider, MD  Cyanocobalamin (VITAMIN B-12 IJ) Inject 1 Syringe as directed every 30 (thirty) days. As directed. Due 08/19/2013   Yes Historical Provider, MD  cyclobenzaprine (FLEXERIL) 10 MG tablet Take 5 mg by mouth at bedtime.   Yes Historical Provider, MD  ferrous sulfate 325 (65 FE) MG tablet Take 325 mg by mouth daily with breakfast.   Yes Historical Provider, MD  glipiZIDE (GLUCOTROL) 10 MG tablet Take 20 mg by mouth daily before breakfast.   Yes Historical Provider, MD  HYDROcodone-acetaminophen (NORCO/VICODIN) 5-325 MG per tablet Take 1 tablet by mouth every 6 (six) hours as needed for moderate pain or severe pain.   Yes Historical Provider, MD  hydrOXYzine (ATARAX/VISTARIL) 25 MG tablet Take 25 mg by mouth 2 (two) times daily as needed for itching (itching).    Yes Historical Provider, MD  lansoprazole (PREVACID) 30 MG capsule Take 30 mg by mouth daily. Before a meal once a day   Yes Historical Provider, MD  Multiple Vitamins-Minerals (PRESERVISION AREDS PO) Take 1 capsule by mouth 2 (two) times daily.   Yes Historical Provider, MD  penicillin v potassium (VEETID) 500 MG tablet Take 500 mg by mouth 2 (two) times daily.   Yes Historical Provider, MD  primidone (MYSOLINE) 50 MG tablet Take 50 mg by mouth 2 (two) times daily.   Yes Historical Provider, MD  simvastatin (ZOCOR) 20 MG tablet Take 20 mg by mouth at bedtime.  03/01/14  Yes Historical Provider, MD   torsemide (DEMADEX) 20 MG tablet Take 20 mg by mouth daily.   Yes Historical Provider, MD   BP 160/74 mmHg  Pulse 80  Temp(Src) 97.7 F (36.5 C) (Oral)  Resp 18  SpO2 100% Physical Exam  Constitutional: She is oriented to person, place, and time. She appears well-developed and well-nourished. No distress.  HENT:  Head: Normocephalic and atraumatic.  Eyes: Pupils are equal, round, and  reactive to light.  Neck: Normal range of motion.  Cardiovascular: Normal rate and intact distal pulses.   Pulmonary/Chest: No respiratory distress.  Abdominal: Normal appearance. She exhibits no distension. There is no tenderness. There is no rebound.  Musculoskeletal: Normal range of motion.  Neurological: She is alert and oriented to person, place, and time. No cranial nerve deficit.  Skin: Skin is warm and dry. No rash noted.  Psychiatric: She has a normal mood and affect. Her speech is normal and behavior is normal. Judgment and thought content normal. Cognition and memory are normal.  Nursing note and vitals reviewed.   ED Course  Procedures (including critical care time) Labs Review Labs Reviewed  ACETAMINOPHEN LEVEL - Abnormal; Notable for the following:    Acetaminophen (Tylenol), Serum <10.0 (*)    All other components within normal limits  CBC - Abnormal; Notable for the following:    WBC 12.0 (*)    RBC 3.10 (*)    Hemoglobin 10.0 (*)    HCT 30.7 (*)    All other components within normal limits  COMPREHENSIVE METABOLIC PANEL - Abnormal; Notable for the following:    Glucose, Bld 147 (*)    BUN 71 (*)    Creatinine, Ser 2.64 (*)    GFR calc non Af Amer 16 (*)    GFR calc Af Amer 19 (*)    All other components within normal limits  URINE RAPID DRUG SCREEN (HOSP PERFORMED) - Abnormal; Notable for the following:    Barbiturates POSITIVE (*)    All other components within normal limits  ETHANOL  SALICYLATE LEVEL    Imaging Review No results found.  TTS saw pt.  Recommended  social work consult which was ordered    MDM   Final diagnoses:  Adjustment disorder with anxious mood        Dot Lanes, MD 09/06/14 2158

## 2014-09-02 NOTE — ED Notes (Signed)
Pt in from home. Daughter from [place] pt. Papers state pt has dementia and is uncooperative with her in home caregivers and unable to care for herself. Pt reports she is fine with her caregivers, except for one and she "was ugly" to her last night, telling her to leave her alone because she wanted to get dressed on her own. Pt feels that she is fine at home with her help other than that one aide. EMS reported that pt threw a walker last night which pt reports she does not remember doing. Pt does admit to some mild memory issues but does not feel this affects her caring for herself. Pt lives with her husband who receives care from the same 3 aides, caregiver present 24hrs/day.

## 2014-09-02 NOTE — ED Notes (Signed)
D/w pt the process after IVC papers are taken out.  Informed pt that she would most likely be here until tomorrow morning.  Pt verbalized understanding.  Pt is concerned about her nightly medication.  Will inform Dr. Audie Pinto.

## 2014-09-02 NOTE — BH Assessment (Signed)
Assessment completed. Consulted Darlyne Russian, PA-C who recommended a social work consult. Dr. Audie Pinto has been informed of the recommendation.

## 2014-09-02 NOTE — ED Notes (Signed)
Bed: QS12 Expected date: 09/02/14 Expected time: 5:24 PM Means of arrival: Ambulance Comments: IVC

## 2014-09-03 DIAGNOSIS — F4322 Adjustment disorder with anxiety: Secondary | ICD-10-CM | POA: Diagnosis present

## 2014-09-03 MED ORDER — PENICILLIN V POTASSIUM 500 MG PO TABS
500.0000 mg | ORAL_TABLET | Freq: Two times a day (BID) | ORAL | Status: DC
  Administered 2014-09-03: 500 mg via ORAL
  Filled 2014-09-03 (×2): qty 1

## 2014-09-03 NOTE — ED Notes (Addendum)
Pt belongings in locker #29. 
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
## 2014-09-03 NOTE — ED Notes (Signed)
Patient reports she has been taking an antibiotic for a few days for a urinary tract infection.  Thinks she needs to resume medication.  She cannot remember the name of the exact medication.

## 2014-09-03 NOTE — ED Notes (Signed)
[facility] called for patient transport home.  

## 2014-09-03 NOTE — ED Provider Notes (Signed)
Pt stable awaiting placement  Maudry Diego, MD 09/03/14 [phone]

## 2014-09-03 NOTE — ED Notes (Signed)
Nurse practitioner and psychiatrist in to see patient.

## 2014-09-03 NOTE — ED Notes (Signed)
Still waiting for [facility] 

## 2014-09-03 NOTE — [facility] Suicide Risk Assessment (Signed)
Suicide Risk Assessment  Discharge Assessment   [facility] Discharge Suicide Risk Assessment   Demographic Factors:  Age 81 or older and Caucasian  Total Time spent with patient: 45 minutes  Musculoskeletal: Strength & Muscle Tone: within normal limits Gait & Station: normal Patient leans: N/A  Psychiatric Specialty Exam:     Blood pressure 136/50, pulse 68, temperature 98.6 F (37 C), temperature source Oral, resp. rate 16, SpO2 100 %.There is no weight on file to calculate BMI.  General Appearance: Casual  Eye Contact::  Good  Speech:  Normal Rate  Volume:  Normal  Mood:  Anxious  Affect:  Congruent  Thought Process:  Coherent  Orientation:  Full (Time, Place, and Person)  Thought Content:  WDL  Suicidal Thoughts:  No  Homicidal Thoughts:  No  Memory:  Immediate;   Good Recent;   Good Remote;   Good  Judgement:  Good  Insight:  Good  Psychomotor Activity:  Normal  Concentration:  Good  Recall:  Good  Fund of Knowledge:Good  Language: Good  Akathisia:  No  Handed:  Right  AIMS (if indicated):     Assets:  Catering manager Housing Leisure Time Resilience Social Support  ADL's:  Intact  Cognition: WNL  Sleep:         Has this patient used any form of tobacco in the last 30 days? (Cigarettes, Smokeless Tobacco, Cigars, and/or Pipes) No  Mental Status Per Nursing Assessment::   On Admission:   Anxiety for being brought to the ED  Current Mental Status by Physician: NA  Loss Factors: NA  Historical Factors: NA  Risk Reduction Factors:   Sense of responsibility to family, Living with another person, especially a relative and Positive social support  Continued Clinical Symptoms:  Anxiety, mild (situational)  Cognitive Features That Contribute To Risk:  None    Suicide Risk:  Minimal: No identifiable suicidal ideation.  Patients presenting with no risk factors but with morbid ruminations; may be classified as minimal risk based on the  severity of the depressive symptoms  Principal Problem: Adjustment disorder with anxious mood Discharge Diagnoses:  Patient Active Problem List   Diagnosis Date Noted  . Adjustment disorder with anxious mood [F43.22] 09/03/2014    Priority: High  . Aspiration pneumonia [J69.0] 08/14/2014  . Difficulty swallowing [R13.10] 08/14/2014  . History of transient ischemic attack (TIA) [Z86.73] 08/14/2014  . Diverticulosis of colon (without mention of hemorrhage) [K57.30] 08/08/2013  . Acute lower GI bleeding [K92.2] 08/07/2013  . Chronic renal disease, stage 4, severely decreased glomerular filtration rate between 15-29 mL/min/1.73 square meter [N18.4] 08/07/2013  . Acute posthemorrhagic anemia [D62] 08/07/2013  . Hypotension due to blood loss [I95.89] 08/07/2013  . CHF (congestive heart failure) [I50.9] 01/05/2012  . Hypertension [I10] 12/19/2011  . GERD (gastroesophageal reflux disease) [K21.9] 12/19/2011  . Hypoxia [R09.02] 12/18/2011  . Diabetes mellitus without complication [K74.2] 59/56/3875  . Anemia of chronic kidney failure [N18.9, D63.1] 07/06/2008  . PULMONARY FIBROSIS ILD POST INFLAMMATORY CHRONIC [J84.10] 07/06/2008  . HYPERCHOLESTEROLEMIA [E78.0] 07/05/2008  . DEPRESSION [F32.9] 07/05/2008      Plan Of Care/Follow-up recommendations:  Activity:  as tolerated Diet:  renal diet  Is patient on multiple antipsychotic therapies at discharge:  No   Has Patient had three or more failed trials of antipsychotic monotherapy by history:  No  Recommended Plan for Multiple Antipsychotic Therapies: NA    Torri Michalski, PMH-NP 09/03/2014, 4:17 PM
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
## 2014-09-03 NOTE — ED Notes (Signed)
Patient waiting to go home.  All dressed and waiting for final discharge.

## 2014-09-21 ENCOUNTER — Other Ambulatory Visit: Payer: Self-pay | Admitting: Neurology

## 2014-09-21 ENCOUNTER — Ambulatory Visit (INDEPENDENT_AMBULATORY_CARE_PROVIDER_SITE_OTHER): Payer: Medicare Other | Admitting: Neurology

## 2014-09-21 VITALS — BP 184/70 | HR 74 | Ht 61.0 in | Wt 125.0 lb

## 2014-09-21 DIAGNOSIS — G3184 Mild cognitive impairment, so stated: Secondary | ICD-10-CM

## 2014-09-21 DIAGNOSIS — R269 Unspecified abnormalities of gait and mobility: Secondary | ICD-10-CM | POA: Diagnosis not present

## 2014-09-21 DIAGNOSIS — J8410 Pulmonary fibrosis, unspecified: Secondary | ICD-10-CM

## 2014-09-21 NOTE — Progress Notes (Signed)
PATIENT: Susan Mccoy DOB: 1931-11-12  HISTORICAL  Susan Mccoy is 81 years old right-handed Caucasian female, accompanied by her daughter Susan Mccoy, and care giver. Susan Mccoy for evaluation of increased confusion  She had a history of pulmonary fibrosis, oxygen dependent since 2013, chronic kidney disease, status post AV fistula placement, but has not received dialysis yet, hypertension, diabetes, previous history of diverticulitis, with GI bleeding.  She is a retired Network engineer at age 67, at 64 years of school, had 3 children, live with her husband, who has suffered stroke, she used to be the main caregiver of her husband, but since 2010, she had gradual onset memory trouble, quit driving about 5 years ago, increased confusion over the past few years, there was no family history of dementia  She tends to repeat herself, gradually needs more helping her daily activity, such as dressing, bathing, confused with her medication schedule, also had a gradual onset gait difficulty she spent most of her days watching TV  She was admitted to hospital in January 2016, for swallowing difficulty, increased confusion, possible pneumonia,and EGD 08/16/2014 showed mid esophageal ulceration due to pill-induced esophagitis which would account for her odynophagia. Although she has a Schatzki's ring, it appears to be widely patent. MBSS performed 1/13 and speech therapy recommended a regular diet and thin liquids  She was discharged to rehabilitation facility, now back to home, daughter noticed increased difficulty, she needs more helping her daily activity, including dressing, bathing, toileting, no hallucinations, but she is much less agreeable, now she has 24 x7 care at home,   Daughter is considering place patients, and her husband to assistant living,  REVIEW OF SYSTEMS: Full 14 system review of systems performed and notable only for fatigue, decreased appetite, activity change, hearing loss, trouble  swallowing, joint swelling, aching muscles, walking difficulty, daytime sleepiness, snoring, dizziness, memory loss, weakness, tremor, agitation, behavior problems.   ALLERGIES: Allergies  Allergen Reactions  . Morphine And Related Other (See Comments)    Very sensitive to all pain meds-narcotic   . Penicillins Other (See Comments)    REACTION: GI Upset  . Sulfonamide Derivatives Other (See Comments)    Unknown. Symptoms were when she was a teenager  . Celebrex [Celecoxib] Rash  . Valium [Diazepam] Other (See Comments)    HOME MEDICATIONS: Current Outpatient Prescriptions  Medication Sig Dispense Refill  . acetaminophen (TYLENOL) 500 MG tablet Take 500 mg by mouth every 6 (six) hours as needed for moderate pain (pain).     Marland Kitchen allopurinol (ZYLOPRIM) 300 MG tablet Take 150 mg by mouth daily.    Marland Kitchen amLODipine (NORVASC) 10 MG tablet Take 10 mg by mouth daily.    Marland Kitchen aspirin EC 81 MG tablet Take 81 mg by mouth daily.    Marland Kitchen atenolol (TENORMIN) 25 MG tablet Take 25 mg by mouth daily.     . calcitRIOL (ROCALTROL) 0.25 MCG capsule Take 0.25 mcg by mouth every other day.    . cholecalciferol (VITAMIN D) 1000 UNITS tablet Take 1,000 Units by mouth daily.    . Cyanocobalamin (VITAMIN B-12 IJ) Inject 1 Syringe as directed every 30 (thirty) days. As directed. Due 08/19/2013    . cyclobenzaprine (FLEXERIL) 10 MG tablet Take 5 mg by mouth at bedtime.    . ferrous sulfate 325 (65 FE) MG tablet Take 325 mg by mouth daily with breakfast.    . glipiZIDE (GLUCOTROL) 10 MG tablet Take 20 mg by mouth daily before breakfast.    . HYDROcodone-acetaminophen (  NORCO/VICODIN) 5-325 MG per tablet Take 1 tablet by mouth every 6 (six) hours as needed for moderate pain or severe pain.    . hydrOXYzine (ATARAX/VISTARIL) 25 MG tablet Take 25 mg by mouth 2 (two) times daily as needed for itching (itching).     . lansoprazole (PREVACID) 30 MG capsule Take 30 mg by mouth daily. Before a meal once a day    . Multiple  Vitamins-Minerals (PRESERVISION AREDS PO) Take 1 capsule by mouth 2 (two) times daily.    . penicillin v potassium (VEETID) 500 MG tablet Take 500 mg by mouth 2 (two) times daily.    . primidone (MYSOLINE) 50 MG tablet Take 50 mg by mouth 2 (two) times daily.    . simvastatin (ZOCOR) 20 MG tablet Take 20 mg by mouth at bedtime.     . torsemide (DEMADEX) 20 MG tablet Take 20 mg by mouth daily.     No current facility-administered medications for this visit.    PAST MEDICAL HISTORY: Past Medical History  Diagnosis Date  . Hypertension   . Thyroid disease   . Abnormal LFTs     Fatty liver by [place]  . Hypercholesterolemia   . Carpal tunnel syndrome   . DJD (degenerative joint disease)   . Shingles   . Meralgia paraesthetica   . GERD (gastroesophageal reflux disease)   . Pulmonary fibrosis   . Hemorrhoids   . Diverticulosis   . Normocytic normochromic anemia   . History of recurrent TIAs   . UTI (lower urinary tract infection)   . Chronic renal disease, stage 4, severely decreased glomerular filtration rate between 15-29 mL/min/1.73 square meter   . Anemia of chronic kidney failure   . Diabetes mellitus without complication     Type 2  . CHF (congestive heart failure)     pt not aware of this  . Bipolar disorder     in the past  . Constipation   . Macular degeneration   . Seizures     questionably had when she had TIA's, takes Primodone, states it has helped with the "shaking"  . Complication of anesthesia     trouble with putting her to sleep - 12/15 - pt doesn't remember this    PAST SURGICAL HISTORY: Past Surgical History  Procedure Laterality Date  . Back surgery      X 2 - lumbar  . Tonsillectomy    . Cataract extraction, bilateral    . Breast biopsy  B/L- said to be negative for malignancy.  . Colonoscopy    . Colonoscopy N/A 08/08/2013    Procedure: COLONOSCOPY;  Surgeon: [name], MD;  Location: [facility];  Service: Endoscopy;  Laterality: N/A;  EGD is  possible  . Carpal tunnel release Left   . Eye surgery Bilateral     cataract surgery with lens implant  . Bascilic vein transposition Left 08/03/2014    Procedure:  INSERTION of Left Arm Gortex graft;  Surgeon: Angelia Mould, MD;  Location: [facility] OR;  Service: Vascular;  Laterality: Left;  . Esophagogastroduodenoscopy (egd) with propofol N/A 08/16/2014    Procedure: ESOPHAGOGASTRODUODENOSCOPY (EGD) WITH PROPOFOL;  Surgeon: Cleotis Nipper, MD;  Location: [facility];  Service: Endoscopy;  Laterality: N/A;    FAMILY HISTORY: Family History  Problem Relation Age of Onset  . Emphysema Father     smoker  . Diabetes Father   . Varicose Veins Father   . Heart disease Father     before  age 84  . Heart attack Father   . Emphysema Brother     smoker  . Cancer Brother   . Varicose Veins Mother   . Cancer Sister   . Diabetes Sister   . Cancer Sister   . Cancer Brother     SOCIAL HISTORY:  History   Social History  . Marital Status: Married    Spouse Name: N/A  . Number of Children: 3  . Years of Education: N/A   Occupational History  . retired    Social History Main Topics  . Smoking status: Former Smoker -- 2.00 packs/day for 40 years    Quit date: 08/05/1991  . Smokeless tobacco: Never Used  . Alcohol Use: No  . Drug Use: No  . Sexual Activity: No   Other Topics Concern  . Not on file   Social History Narrative   Married, lives in [place] - husband disabled after stroke 02/2013   Retired from city of [place]   2 daughters ([place] and [place], [place]) 1 son Laurance [place], [place])   Updated 08/07/2013              PHYSICAL EXAM   Filed Vitals:   09/21/14 1128  BP: 184/70  Mccoy: 76  Height: 5\' 1"  (1.549 m)  Weight: 125 lb (56.7 kg)    Not recorded      Body mass index is 23.63 kg/(m^2).   Generalized: In no acute distress  Neck: Supple, no carotid bruits   Cardiac: Regular rate rhythm  Pulmonary: Clear to auscultation  bilaterally  Musculoskeletal: No deformity  Neurological examination  Mentation: use nasal oxygen, rely on her daughter to provide history, Mini-Mental Status Examination is 28 out of 30, she is not oriented to date, missed 1 out of 3 recalls,   Cranial nerve II-XII: Pupils were equal round reactive to light. Extraocular movements were full.  Visual field were full on confrontational test. Bilateral fundi were sharp.  Facial sensation and strength were normal. Hearing was intact to finger rubbing bilaterally. Uvula tongue midline.  Head turning and shoulder shrug and were normal and symmetric.Tongue protrusion into cheek strength was normal.  Motor: Mild right more than left upper extremity rigidity, bradykinesia, mild nuchal rigidity,   Sensory: Deferred   Coordination: Normal finger to nose, heel-to-shin bilaterally there was no truncal ataxia  Gait: Rising up by assistant, decreased stride, cautious,  Romberg signs: Negative  Deep tendon reflexes: Brachioradialis 2/2, biceps 2/2, triceps 2/2, patellar 2/2, Achilles  trace, , plantar responses were flexor bilaterally.   DIAGNOSTIC DATA (LABS, IMAGING, TESTING) - I reviewed patient records, labs, notes, testing and imaging myself where available.  Lab Results  Component Value Date   WBC 12.0* 09/02/2014   HGB 10.0* 09/02/2014   HCT 30.7* 09/02/2014   MCV 99.0 09/02/2014   PLT 273 09/02/2014      Component Value Date/Time   NA 135 09/02/2014 1845   K 4.3 09/02/2014 1845   CL 102 09/02/2014 1845   CO2 23 09/02/2014 1845   GLUCOSE 147* 09/02/2014 1845   BUN 71* 09/02/2014 1845   CREATININE 2.64* 09/02/2014 1845   CALCIUM 8.7 09/02/2014 1845   PROT 7.7 09/02/2014 1845   ALBUMIN 3.7 09/02/2014 1845   AST 20 09/02/2014 1845   ALT 21 09/02/2014 1845   ALKPHOS 114 09/02/2014 1845   BILITOT 0.3 09/02/2014 1845   GFRNONAA 16* 09/02/2014 1845   GFRAA 19* 09/02/2014 1845   Lab Results  Component Value Date  CHOL 216*  08/15/2014   HDL 28* 08/15/2014   LDLCALC 146* 08/15/2014   TRIG 211* 08/15/2014   CHOLHDL 7.7 08/15/2014   Lab Results  Component Value Date   HGBA1C 6.4* 08/07/2013   Lab Results  Component Value Date   YKDXIPJA25 053 12/20/2011   Lab Results  Component Value Date   TSH 2.750 08/07/2013    ASSESSMENT AND PLAN  RHILEY TARVER is a 81 y.o. female with mild cognitive impairment over the past few years, mild parkinsonian features, increased difficulty with her daily function, complicated by her comorbidity of pulmonary fibrosis, require home oxygen, diverticulitis, swallowing difficulties,   1, placement in a more supervised, protected environment such as assistant living, would be a good choice, 2, I went over her home medication list,  3 moderate exercise,  4 return to clinic in 2 months. .   No orders of the defined types were placed in this encounter.    New Prescriptions   No medications on file    Medications Discontinued During This Encounter  Medication Reason  . acetaminophen (TYLENOL) 500 MG tablet One time medication  . amLODipine (NORVASC) 10 MG tablet Discontinued by provider  . HYDROcodone-acetaminophen (NORCO/VICODIN) 5-325 MG per tablet One time medication  . penicillin v potassium (VEETID) 500 MG tablet One time medication    Return in about 2 months (around 11/20/2014). Marcial Pacas, M.D. Ph.D.  [facility] [address], [place], [place] [address] Ph: [phone] Fax: [phone]

## 2014-09-22 DIAGNOSIS — G3184 Mild cognitive impairment, so stated: Secondary | ICD-10-CM | POA: Insufficient documentation

## 2014-09-22 DIAGNOSIS — R269 Unspecified abnormalities of gait and mobility: Secondary | ICD-10-CM | POA: Insufficient documentation

## 2014-09-29 ENCOUNTER — Telehealth: Payer: Self-pay | Admitting: Neurology

## 2014-09-29 MED ORDER — SERTRALINE HCL 50 MG PO TABS: 50.0000 mg | ORAL_TABLET | Freq: Every day | ORAL | Status: AC

## 2014-09-29 NOTE — Telephone Encounter (Signed)
I talked with her Daughter [name], who is on HIPPA form, she is legal and medical POA of the patient,  She reported that patient has increased agitation, does not agree to go to the assistant living, that her daughter Juliann Pulse has lined up for her and her husband, there are different opinion among family members,  I reviewed previous Mini-Mental Status Examination 28 out of 30 in September 21 2014,   I have suggested Zoloft 50 mg daily for her agitation, axiety Neuropsychiatric evaluations, her daughter will call, if wants to proceed,

## 2014-09-29 NOTE — Telephone Encounter (Signed)
Patient's daughter is calling. Patient was seen in our office last week and daughter states the patient's diagnosis is brain atrophy. The patient is very hostile and angry with everyone and her daughter needs advise. Please call.

## 2014-11-01 ENCOUNTER — Ambulatory Visit: Payer: Medicare Other | Admitting: Podiatry

## 2014-11-27 ENCOUNTER — Ambulatory Visit: Payer: Medicare Other | Admitting: Neurology

## 2015-02-11 ENCOUNTER — Emergency Department

## 2015-02-11 ENCOUNTER — Inpatient Hospital Stay
Admission: EM | Admit: 2015-02-11 | Discharge: 2015-02-13 | DRG: 189 | Attending: Internal Medicine | Admitting: Internal Medicine

## 2015-02-11 DIAGNOSIS — J9620 Acute and chronic respiratory failure, unspecified whether with hypoxia or hypercapnia: Secondary | ICD-10-CM | POA: Diagnosis present

## 2015-02-11 DIAGNOSIS — J189 Pneumonia, unspecified organism: Secondary | ICD-10-CM | POA: Diagnosis present

## 2015-02-11 DIAGNOSIS — E871 Hypo-osmolality and hyponatremia: Secondary | ICD-10-CM | POA: Diagnosis present

## 2015-02-11 DIAGNOSIS — E860 Dehydration: Secondary | ICD-10-CM | POA: Diagnosis present

## 2015-02-11 DIAGNOSIS — Z87891 Personal history of nicotine dependence: Secondary | ICD-10-CM | POA: Diagnosis not present

## 2015-02-11 DIAGNOSIS — J9600 Acute respiratory failure, unspecified whether with hypoxia or hypercapnia: Secondary | ICD-10-CM

## 2015-02-11 DIAGNOSIS — Z8249 Family history of ischemic heart disease and other diseases of the circulatory system: Secondary | ICD-10-CM | POA: Diagnosis not present

## 2015-02-11 DIAGNOSIS — J441 Chronic obstructive pulmonary disease with (acute) exacerbation: Secondary | ICD-10-CM | POA: Diagnosis present

## 2015-02-11 DIAGNOSIS — Z66 Do not resuscitate: Secondary | ICD-10-CM | POA: Diagnosis present

## 2015-02-11 DIAGNOSIS — D72829 Elevated white blood cell count, unspecified: Secondary | ICD-10-CM

## 2015-02-11 DIAGNOSIS — R0602 Shortness of breath: Secondary | ICD-10-CM

## 2015-02-11 DIAGNOSIS — Z9981 Dependence on supplemental oxygen: Secondary | ICD-10-CM | POA: Diagnosis not present

## 2015-02-11 DIAGNOSIS — D631 Anemia in chronic kidney disease: Secondary | ICD-10-CM | POA: Diagnosis present

## 2015-02-11 DIAGNOSIS — J440 Chronic obstructive pulmonary disease with acute lower respiratory infection: Secondary | ICD-10-CM | POA: Diagnosis present

## 2015-02-11 DIAGNOSIS — I509 Heart failure, unspecified: Secondary | ICD-10-CM | POA: Diagnosis present

## 2015-02-11 DIAGNOSIS — Z885 Allergy status to narcotic agent status: Secondary | ICD-10-CM | POA: Diagnosis not present

## 2015-02-11 DIAGNOSIS — Z888 Allergy status to other drugs, medicaments and biological substances status: Secondary | ICD-10-CM

## 2015-02-11 DIAGNOSIS — Z9841 Cataract extraction status, right eye: Secondary | ICD-10-CM

## 2015-02-11 DIAGNOSIS — Z809 Family history of malignant neoplasm, unspecified: Secondary | ICD-10-CM

## 2015-02-11 DIAGNOSIS — M1990 Unspecified osteoarthritis, unspecified site: Secondary | ICD-10-CM | POA: Diagnosis present

## 2015-02-11 DIAGNOSIS — N179 Acute kidney failure, unspecified: Secondary | ICD-10-CM | POA: Diagnosis present

## 2015-02-11 DIAGNOSIS — Z515 Encounter for palliative care: Secondary | ICD-10-CM | POA: Diagnosis not present

## 2015-02-11 DIAGNOSIS — Z9842 Cataract extraction status, left eye: Secondary | ICD-10-CM | POA: Diagnosis not present

## 2015-02-11 DIAGNOSIS — T501X5A Adverse effect of loop [high-ceiling] diuretics, initial encounter: Secondary | ICD-10-CM | POA: Diagnosis present

## 2015-02-11 DIAGNOSIS — N184 Chronic kidney disease, stage 4 (severe): Secondary | ICD-10-CM | POA: Diagnosis present

## 2015-02-11 DIAGNOSIS — Z882 Allergy status to sulfonamides status: Secondary | ICD-10-CM | POA: Diagnosis not present

## 2015-02-11 DIAGNOSIS — Z7982 Long term (current) use of aspirin: Secondary | ICD-10-CM | POA: Diagnosis not present

## 2015-02-11 DIAGNOSIS — E119 Type 2 diabetes mellitus without complications: Secondary | ICD-10-CM | POA: Diagnosis present

## 2015-02-11 DIAGNOSIS — E785 Hyperlipidemia, unspecified: Secondary | ICD-10-CM | POA: Diagnosis present

## 2015-02-11 DIAGNOSIS — Z87440 Personal history of urinary (tract) infections: Secondary | ICD-10-CM

## 2015-02-11 DIAGNOSIS — Z961 Presence of intraocular lens: Secondary | ICD-10-CM | POA: Diagnosis present

## 2015-02-11 DIAGNOSIS — Z8673 Personal history of transient ischemic attack (TIA), and cerebral infarction without residual deficits: Secondary | ICD-10-CM

## 2015-02-11 DIAGNOSIS — Z825 Family history of asthma and other chronic lower respiratory diseases: Secondary | ICD-10-CM | POA: Diagnosis not present

## 2015-02-11 DIAGNOSIS — J9621 Acute and chronic respiratory failure with hypoxia: Principal | ICD-10-CM | POA: Diagnosis present

## 2015-02-11 DIAGNOSIS — F0390 Unspecified dementia without behavioral disturbance: Secondary | ICD-10-CM | POA: Diagnosis present

## 2015-02-11 DIAGNOSIS — E780 Pure hypercholesterolemia: Secondary | ICD-10-CM | POA: Diagnosis present

## 2015-02-11 DIAGNOSIS — K219 Gastro-esophageal reflux disease without esophagitis: Secondary | ICD-10-CM | POA: Diagnosis present

## 2015-02-11 DIAGNOSIS — Z880 Allergy status to penicillin: Secondary | ICD-10-CM | POA: Diagnosis not present

## 2015-02-11 DIAGNOSIS — Z9889 Other specified postprocedural states: Secondary | ICD-10-CM

## 2015-02-11 DIAGNOSIS — I129 Hypertensive chronic kidney disease with stage 1 through stage 4 chronic kidney disease, or unspecified chronic kidney disease: Secondary | ICD-10-CM | POA: Diagnosis present

## 2015-02-11 DIAGNOSIS — Z79899 Other long term (current) drug therapy: Secondary | ICD-10-CM

## 2015-02-11 DIAGNOSIS — M79601 Pain in right arm: Secondary | ICD-10-CM

## 2015-02-11 DIAGNOSIS — H3530 Unspecified macular degeneration: Secondary | ICD-10-CM | POA: Diagnosis present

## 2015-02-11 DIAGNOSIS — Z833 Family history of diabetes mellitus: Secondary | ICD-10-CM

## 2015-02-11 DIAGNOSIS — J209 Acute bronchitis, unspecified: Secondary | ICD-10-CM | POA: Diagnosis present

## 2015-02-11 DIAGNOSIS — N189 Chronic kidney disease, unspecified: Secondary | ICD-10-CM

## 2015-02-11 LAB — CBC WITH DIFFERENTIAL/PLATELET
BASOS ABS: 0.1 10*3/uL (ref 0–0.1)
Basophils Relative: 0 %
EOS PCT: 1 %
Eosinophils Absolute: 0.2 10*3/uL (ref 0–0.7)
HCT: 29.6 % — ABNORMAL LOW (ref 35.0–47.0)
Hemoglobin: 9.5 g/dL — ABNORMAL LOW (ref 12.0–16.0)
Lymphocytes Relative: 9 %
Lymphs Abs: 1.7 10*3/uL (ref 1.0–3.6)
MCH: 32 pg (ref 26.0–34.0)
MCHC: 32.1 g/dL (ref 32.0–36.0)
MCV: 99.8 fL (ref 80.0–100.0)
Monocytes Absolute: 1.8 10*3/uL — ABNORMAL HIGH (ref 0.2–0.9)
Monocytes Relative: 10 %
Neutro Abs: 14.3 10*3/uL — ABNORMAL HIGH (ref 1.4–6.5)
Neutrophils Relative %: 80 %
PLATELETS: 199 10*3/uL (ref 150–440)
RBC: 2.96 MIL/uL — ABNORMAL LOW (ref 3.80–5.20)
RDW: 15.6 % — AB (ref 11.5–14.5)
WBC: 18 10*3/uL — ABNORMAL HIGH (ref 3.6–11.0)

## 2015-02-11 LAB — COMPREHENSIVE METABOLIC PANEL
ALT: 22 U/L (ref 14–54)
ANION GAP: 11 (ref 5–15)
AST: 22 U/L (ref 15–41)
Albumin: 3.3 g/dL — ABNORMAL LOW (ref 3.5–5.0)
Alkaline Phosphatase: 75 U/L (ref 38–126)
BUN: 75 mg/dL — AB (ref 6–20)
CALCIUM: 8.3 mg/dL — AB (ref 8.9–10.3)
CO2: 24 mmol/L (ref 22–32)
CREATININE: 3.71 mg/dL — AB (ref 0.44–1.00)
Chloride: 97 mmol/L — ABNORMAL LOW (ref 101–111)
GFR, EST AFRICAN AMERICAN: 12 mL/min — AB (ref >60)
GFR, EST NON AFRICAN AMERICAN: 11 mL/min — AB (ref >60)
Glucose, Bld: 113 mg/dL — ABNORMAL HIGH (ref 65–99)
Potassium: 4.4 mmol/L (ref 3.5–5.1)
Sodium: 132 mmol/L — ABNORMAL LOW (ref 135–145)
Total Bilirubin: 0.3 mg/dL (ref 0.3–1.2)
Total Protein: 7.3 g/dL (ref 6.5–8.1)

## 2015-02-11 LAB — URINALYSIS COMPLETE WITH MICROSCOPIC (ARMC ONLY)
BILIRUBIN URINE: NEGATIVE
Bacteria, UA: NONE SEEN
GLUCOSE, UA: NEGATIVE mg/dL
Hgb urine dipstick: NEGATIVE
KETONES UR: NEGATIVE mg/dL
Nitrite: NEGATIVE
Protein, ur: NEGATIVE mg/dL
RBC / HPF: NONE SEEN RBC/hpf (ref 0–5)
SPECIFIC GRAVITY, URINE: 1.012 (ref 1.005–1.030)
pH: 5 (ref 5.0–8.0)

## 2015-02-11 LAB — GLUCOSE, CAPILLARY: Glucose-Capillary: 72 mg/dL (ref 65–99)

## 2015-02-11 MED ORDER — FAMOTIDINE IN NACL 20-0.9 MG/50ML-% IV SOLN
20.0000 mg | Freq: Two times a day (BID) | INTRAVENOUS | Status: DC
  Administered 2015-02-11 – 2015-02-12 (×2): 20 mg via INTRAVENOUS
  Filled 2015-02-11 (×5): qty 50

## 2015-02-11 MED ORDER — GLIPIZIDE ER 10 MG PO TB24
20.0000 mg | ORAL_TABLET | ORAL | Status: DC
  Administered 2015-02-12 – 2015-02-13 (×2): 20 mg via ORAL
  Filled 2015-02-11 (×2): qty 2

## 2015-02-11 MED ORDER — IPRATROPIUM-ALBUTEROL 0.5-2.5 (3) MG/3ML IN SOLN
3.0000 mL | Freq: Four times a day (QID) | RESPIRATORY_TRACT | Status: DC
  Administered 2015-02-11 – 2015-02-13 (×7): 3 mL via RESPIRATORY_TRACT
  Filled 2015-02-11 (×9): qty 3

## 2015-02-11 MED ORDER — AMLODIPINE BESYLATE 5 MG PO TABS
5.0000 mg | ORAL_TABLET | Freq: Every day | ORAL | Status: DC
  Administered 2015-02-12 – 2015-02-13 (×2): 5 mg via ORAL
  Filled 2015-02-11 (×2): qty 1

## 2015-02-11 MED ORDER — HYDROCODONE-ACETAMINOPHEN 5-325 MG PO TABS
1.0000 | ORAL_TABLET | ORAL | Status: DC | PRN
  Administered 2015-02-12 (×2): 2 via ORAL
  Filled 2015-02-11 (×2): qty 2

## 2015-02-11 MED ORDER — LEVOFLOXACIN IN D5W 500 MG/100ML IV SOLN: 500.0000 mg | INTRAVENOUS | Status: DC

## 2015-02-11 MED ORDER — VITAMIN D 1000 UNITS PO TABS
1000.0000 [IU] | ORAL_TABLET | Freq: Every day | ORAL | Status: DC
  Administered 2015-02-12 – 2015-02-13 (×2): 1000 [IU] via ORAL
  Filled 2015-02-11 (×2): qty 1

## 2015-02-11 MED ORDER — ONDANSETRON HCL 4 MG PO TABS: 4.0000 mg | ORAL_TABLET | Freq: Four times a day (QID) | ORAL | Status: DC | PRN

## 2015-02-11 MED ORDER — CYCLOBENZAPRINE HCL 10 MG PO TABS
5.0000 mg | ORAL_TABLET | Freq: Every day | ORAL | Status: DC
  Administered 2015-02-11 – 2015-02-12 (×2): 5 mg via ORAL
  Filled 2015-02-11 (×2): qty 1

## 2015-02-11 MED ORDER — VANCOMYCIN HCL IN DEXTROSE 1-5 GM/200ML-% IV SOLN
INTRAVENOUS | Status: AC
  Filled 2015-02-11: qty 200

## 2015-02-11 MED ORDER — METHYLPREDNISOLONE SODIUM SUCC 125 MG IJ SOLR
60.0000 mg | Freq: Two times a day (BID) | INTRAMUSCULAR | Status: DC
  Administered 2015-02-11 – 2015-02-13 (×5): 60 mg via INTRAVENOUS
  Filled 2015-02-11 (×5): qty 2

## 2015-02-11 MED ORDER — VANCOMYCIN HCL IN DEXTROSE 1-5 GM/200ML-% IV SOLN
1000.0000 mg | Freq: Once | INTRAVENOUS | Status: AC
  Administered 2015-02-11: 1000 mg via INTRAVENOUS

## 2015-02-11 MED ORDER — ASPIRIN EC 81 MG PO TBEC: 81.0000 mg | DELAYED_RELEASE_TABLET | Freq: Every day | ORAL | Status: DC

## 2015-02-11 MED ORDER — LEVOFLOXACIN IN D5W 250 MG/50ML IV SOLN
250.0000 mg | INTRAVENOUS | Status: DC
  Filled 2015-02-11: qty 50

## 2015-02-11 MED ORDER — HYDROXYZINE HCL 25 MG PO TABS
25.0000 mg | ORAL_TABLET | Freq: Three times a day (TID) | ORAL | Status: DC | PRN
  Administered 2015-02-12: 25 mg via ORAL
  Filled 2015-02-11: qty 1

## 2015-02-11 MED ORDER — ATENOLOL 25 MG PO TABS
12.5000 mg | ORAL_TABLET | Freq: Every day | ORAL | Status: DC
  Administered 2015-02-12 – 2015-02-13 (×2): 12.5 mg via ORAL
  Filled 2015-02-11 (×2): qty 1

## 2015-02-11 MED ORDER — PIPERACILLIN-TAZOBACTAM 3.375 G IVPB
3.3750 g | Freq: Once | INTRAVENOUS | Status: AC
  Administered 2015-02-11: 3.375 g via INTRAVENOUS

## 2015-02-11 MED ORDER — OCUVITE PO TABS
1.0000 | ORAL_TABLET | Freq: Every day | ORAL | Status: DC
  Administered 2015-02-12 – 2015-02-13 (×2): 1 via ORAL
  Filled 2015-02-11 (×4): qty 1

## 2015-02-11 MED ORDER — HEPARIN SODIUM (PORCINE) 5000 UNIT/ML IJ SOLN
5000.0000 [IU] | Freq: Three times a day (TID) | INTRAMUSCULAR | Status: DC
  Administered 2015-02-11 – 2015-02-13 (×6): 5000 [IU] via SUBCUTANEOUS
  Filled 2015-02-11 (×6): qty 1

## 2015-02-11 MED ORDER — DOCUSATE SODIUM 100 MG PO CAPS
100.0000 mg | ORAL_CAPSULE | Freq: Two times a day (BID) | ORAL | Status: DC
  Administered 2015-02-11 – 2015-02-13 (×4): 100 mg via ORAL
  Filled 2015-02-11 (×4): qty 1

## 2015-02-11 MED ORDER — ONDANSETRON HCL 4 MG/2ML IJ SOLN
4.0000 mg | Freq: Four times a day (QID) | INTRAMUSCULAR | Status: DC | PRN
  Administered 2015-02-12: 4 mg via INTRAVENOUS
  Filled 2015-02-11: qty 2

## 2015-02-11 MED ORDER — VANCOMYCIN HCL 10 G IV SOLR
1000.0000 mg | Freq: Once | INTRAVENOUS | Status: DC
Start: 2015-02-11 — End: 2015-02-11

## 2015-02-11 MED ORDER — SERTRALINE HCL 50 MG PO TABS
50.0000 mg | ORAL_TABLET | Freq: Every day | ORAL | Status: DC
  Administered 2015-02-11 – 2015-02-13 (×3): 50 mg via ORAL
  Filled 2015-02-11 (×3): qty 1

## 2015-02-11 MED ORDER — FERROUS SULFATE 325 (65 FE) MG PO TABS
325.0000 mg | ORAL_TABLET | Freq: Every day | ORAL | Status: DC
  Administered 2015-02-12 – 2015-02-13 (×2): 325 mg via ORAL
  Filled 2015-02-11 (×2): qty 1

## 2015-02-11 MED ORDER — LEVOFLOXACIN IN D5W 500 MG/100ML IV SOLN
500.0000 mg | Freq: Once | INTRAVENOUS | Status: AC
  Administered 2015-02-11: 500 mg via INTRAVENOUS
  Filled 2015-02-11: qty 100

## 2015-02-11 MED ORDER — ALLOPURINOL 300 MG PO TABS
300.0000 mg | ORAL_TABLET | Freq: Every day | ORAL | Status: DC
  Administered 2015-02-12: 300 mg via ORAL
  Filled 2015-02-11: qty 1

## 2015-02-11 MED ORDER — ACETAMINOPHEN 650 MG RE SUPP: 650.0000 mg | Freq: Four times a day (QID) | RECTAL | Status: DC | PRN

## 2015-02-11 MED ORDER — ACETAMINOPHEN 325 MG PO TABS
650.0000 mg | ORAL_TABLET | Freq: Four times a day (QID) | ORAL | Status: DC | PRN
  Administered 2015-02-12 – 2015-02-13 (×2): 650 mg via ORAL
  Filled 2015-02-11 (×2): qty 2

## 2015-02-11 MED ORDER — INSULIN ASPART 100 UNIT/ML ~~LOC~~ SOLN
0.0000 [IU] | Freq: Three times a day (TID) | SUBCUTANEOUS | Status: DC
  Administered 2015-02-12: 3 [IU] via SUBCUTANEOUS
  Administered 2015-02-12: 2 [IU] via SUBCUTANEOUS
  Filled 2015-02-11: qty 2
  Filled 2015-02-11: qty 3

## 2015-02-11 MED ORDER — CETYLPYRIDINIUM CHLORIDE 0.05 % MT LIQD
7.0000 mL | Freq: Two times a day (BID) | OROMUCOSAL | Status: DC
  Administered 2015-02-13: 7 mL via OROMUCOSAL

## 2015-02-11 MED ORDER — SODIUM CHLORIDE 0.9 % IJ SOLN
3.0000 mL | Freq: Two times a day (BID) | INTRAMUSCULAR | Status: DC
Start: 2015-02-11 — End: 2015-02-13
  Administered 2015-02-12 – 2015-02-13 (×3): 3 mL via INTRAVENOUS

## 2015-02-11 MED ORDER — CHLORHEXIDINE GLUCONATE 0.12 % MT SOLN
15.0000 mL | Freq: Two times a day (BID) | OROMUCOSAL | Status: DC
  Administered 2015-02-11 – 2015-02-13 (×4): 15 mL via OROMUCOSAL

## 2015-02-11 MED ORDER — PRIMIDONE 50 MG PO TABS
50.0000 mg | ORAL_TABLET | Freq: Three times a day (TID) | ORAL | Status: DC
  Administered 2015-02-11 – 2015-02-13 (×6): 50 mg via ORAL
  Filled 2015-02-11 (×9): qty 1

## 2015-02-11 MED ORDER — PIPERACILLIN-TAZOBACTAM 3.375 G IVPB
INTRAVENOUS | Status: AC
  Administered 2015-02-11: 3.375 g via INTRAVENOUS
  Filled 2015-02-11: qty 50

## 2015-02-11 MED ORDER — ASPIRIN EC 81 MG PO TBEC
81.0000 mg | DELAYED_RELEASE_TABLET | Freq: Every day | ORAL | Status: DC
  Administered 2015-02-12 – 2015-02-13 (×2): 81 mg via ORAL
  Filled 2015-02-11 (×2): qty 1

## 2015-02-11 NOTE — ED Notes (Signed)
Pt assisted with ambulation to commode but unable to urinate. Pt denies want for catheter at this time for I&O specimen. Will attempt to collect at later time.

## 2015-02-11 NOTE — H&P (Signed)
History and Physical    [name] YKD:[id number] DOB: [birth year] DOA: 02/11/2015  Referring physician: Dr. Kerman Passey PCP: Henrine Screws, MD  Specialists: none  Chief Complaint: fever with cough  HPI: Susan Mccoy is a 81 y.o. female has a past medical history significant for Pulmonary fibrosis, chronic respiratory failure on O2, DM, and dementia who comes from ALF with high fever and cough and progressive weakness. More hypoxic in ER with dehydration and acute on chronic renal failure. Confused. Now admitted.  Review of Systems: The patient denies  weight loss,, vision loss, decreased hearing, hoarseness, chest pain, syncope, dyspnea on exertion, peripheral edema, balance deficits, hemoptysis, abdominal pain, melena, hematochezia, severe indigestion/heartburn, hematuria, incontinence, genital sores, muscle weakness, suspicious skin lesions, transient blindness,  depression, unusual weight change, abnormal bleeding, enlarged lymph nodes, angioedema, and breast masses.   Past Medical History  Diagnosis Date  . Hypertension   . Thyroid disease   . Abnormal LFTs     Fatty liver by [place]  . Hypercholesterolemia   . Carpal tunnel syndrome   . DJD (degenerative joint disease)   . Shingles   . Meralgia paraesthetica   . GERD (gastroesophageal reflux disease)   . Pulmonary fibrosis   . Hemorrhoids   . Diverticulosis   . Normocytic normochromic anemia   . History of recurrent TIAs   . UTI (lower urinary tract infection)   . Chronic renal disease, stage 4, severely decreased glomerular filtration rate between 15-29 mL/min/1.73 square meter   . Anemia of chronic kidney failure   . Diabetes mellitus without complication     Type 2  . CHF (congestive heart failure)     pt not aware of this  . Bipolar disorder     in the past  . Constipation   . Macular degeneration   . Seizures     questionably had when she had TIA's, takes Primodone, states it has helped with the  "shaking"  . Complication of anesthesia     trouble with putting her to sleep - 12/15 - pt doesn't remember this   Past Surgical History  Procedure Laterality Date  . Back surgery      X 2 - lumbar  . Tonsillectomy    . Cataract extraction, bilateral    . Breast biopsy  B/L- said to be negative for malignancy.  . Colonoscopy    . Colonoscopy N/A 08/08/2013    Procedure: COLONOSCOPY;  Surgeon: [name], MD;  Location: [facility];  Service: Endoscopy;  Laterality: N/A;  EGD is possible  . Carpal tunnel release Left   . Eye surgery Bilateral     cataract surgery with lens implant  . Bascilic vein transposition Left 08/03/2014    Procedure:  INSERTION of Left Arm Gortex graft;  Surgeon: Angelia Mould, MD;  Location: [facility] OR;  Service: Vascular;  Laterality: Left;  . Esophagogastroduodenoscopy (egd) with propofol N/A 08/16/2014    Procedure: ESOPHAGOGASTRODUODENOSCOPY (EGD) WITH PROPOFOL;  Surgeon: Cleotis Nipper, MD;  Location: [facility];  Service: Endoscopy;  Laterality: N/A;   Social History:  reports that she quit smoking about 23 years ago. She has never used smokeless tobacco. She reports that she does not drink alcohol or use illicit drugs.  Allergies  Allergen Reactions  . Morphine And Related Other (See Comments)    Very sensitive to all pain meds-narcotic   . Penicillins Other (See Comments)    REACTION: GI Upset  . Sulfonamide Derivatives Other (See Comments)  Unknown. Symptoms were when she was a teenager  . Celebrex [Celecoxib] Rash  . Valium [Diazepam] Other (See Comments)    Family History  Problem Relation Age of Onset  . Emphysema Father     smoker  . Diabetes Father   . Varicose Veins Father   . Heart disease Father     before age 57  . Heart attack Father   . Emphysema Brother     smoker  . Cancer Brother   . Varicose Veins Mother   . Cancer Sister   . Diabetes Sister   . Cancer Sister   . Cancer Brother     Prior to Admission  medications   Medication Sig Start Date End Date Taking? Authorizing Provider  acetaminophen (TYLENOL) 325 MG tablet Take 650 mg by mouth every 4 (four) hours.   Yes Historical Provider, MD  allopurinol (ZYLOPRIM) 300 MG tablet Take 300 mg by mouth daily.    Yes Historical Provider, MD  amLODipine (NORVASC) 5 MG tablet Take 5 mg by mouth daily.   Yes Historical Provider, MD  aspirin EC 81 MG tablet Take 81 mg by mouth daily.   Yes Historical Provider, MD  atenolol (TENORMIN) 25 MG tablet Take 12.5 mg by mouth daily.    Yes Historical Provider, MD  calcitRIOL (ROCALTROL) 0.25 MCG capsule Take 0.25 mcg by mouth 2 (two) times a week. Take on Monday and Thursday.   Yes Historical Provider, MD  cholecalciferol (VITAMIN D) 1000 UNITS tablet Take 1,000 Units by mouth daily.   Yes Historical Provider, MD  cyanocobalamin (,VITAMIN B-12,) 1000 MCG/ML injection Inject 1,000 mcg into the muscle every 30 (thirty) days.   Yes Historical Provider, MD  cyclobenzaprine (FLEXERIL) 10 MG tablet Take 5 mg by mouth at bedtime.   Yes Historical Provider, MD  dextromethorphan (DELSYM) 30 MG/5ML liquid Take 60 mg by mouth every 12 (twelve) hours.   Yes Historical Provider, MD  ferrous sulfate 325 (65 FE) MG tablet Take 325 mg by mouth daily with breakfast.   Yes Historical Provider, MD  glipiZIDE (GLUCOTROL XL) 10 MG 24 hr tablet Take 20 mg by mouth every morning.   Yes Historical Provider, MD  hydrOXYzine (ATARAX/VISTARIL) 25 MG tablet Take 25 mg by mouth 3 (three) times daily as needed for itching (itching).    Yes Historical Provider, MD  lansoprazole (PREVACID) 30 MG capsule Take 30 mg by mouth daily. Before a meal once a day   Yes Historical Provider, MD  Multiple Vitamins-Minerals (I-VITE) TABS Take 1 tablet by mouth daily.   Yes Historical Provider, MD  primidone (MYSOLINE) 50 MG tablet Take 50 mg by mouth 3 (three) times daily.    Yes Historical Provider, MD  torsemide (DEMADEX) 20 MG tablet Take 20 mg by mouth  daily.   Yes Historical Provider, MD  sertraline (ZOLOFT) 50 MG tablet Take 1 tablet (50 mg total) by mouth daily. 09/29/14   Marcial Pacas, MD   Physical Exam: Filed Vitals:   02/11/15 1430 02/11/15 1445 02/11/15 1500 02/11/15 1515  BP: 153/57 156/60 154/66 160/90  Pulse: 81 84 79 80  Temp:      TempSrc:      Resp: 25 25 21 19   Height:      Weight:      SpO2: 100% 100% 98% 98%     General:  No apparent distress  Eyes: PERRL, EOMI, no scleral icterus  ENT: moist oropharynx  Neck: supple, no lymphadenopathy  Cardiovascular: regular rate without  MRG; 2+ peripheral pulses, no JVD, no peripheral edema  Respiratory: CTA biL, good air movement without wheezing, rhonchi or crackled  Abdomen: soft, non tender to palpation, positive bowel sounds, no guarding, no rebound  Skin: no rashes  Musculoskeletal: normal bulk and tone, no joint swelling  Psychiatric: normal mood and affect  Neurologic: CN 2-12 grossly intact, MS 5/5 in all 4  Labs on Admission:  Basic Metabolic Panel:  Recent Labs Lab 02/11/15 1259  NA 132*  K 4.4  CL 97*  CO2 24  GLUCOSE 113*  BUN 75*  CREATININE 3.71*  CALCIUM 8.3*   Liver Function Tests:  Recent Labs Lab 02/11/15 1259  AST 22  ALT 22  ALKPHOS 75  BILITOT 0.3  PROT 7.3  ALBUMIN 3.3*   No results for input(s): LIPASE, AMYLASE in the last 168 hours. No results for input(s): AMMONIA in the last 168 hours. CBC:  Recent Labs Lab 02/11/15 1259  WBC 18.0*  NEUTROABS 14.3*  HGB 9.5*  HCT 29.6*  MCV 99.8  PLT 199   Cardiac Enzymes: No results for input(s): CKTOTAL, CKMB, CKMBINDEX, TROPONINI in the last 168 hours.  BNP (last 3 results)  Recent Labs  08/14/14 1815 08/15/14 0636  BNP 192.1* 198.0*    ProBNP (last 3 results) No results for input(s): PROBNP in the last 8760 hours.  CBG: No results for input(s): GLUCAP in the last 168 hours.  Radiological Exams on Admission: Dg Chest Portable 1 View  02/11/2015    CLINICAL DATA:  Cough and fever for 2 days.  EXAM: PORTABLE CHEST - 1 VIEW  COMPARISON:  08/07/2013.  FINDINGS: The cardiac silhouette, mediastinal and hilar contours are mildly prominent but unchanged. There is tortuosity and calcification of the thoracic aorta. Severe chronic lung disease without definite acute overlying pulmonary process.  IMPRESSION: Severe chronic lung disease without definite acute overlying pulmonary findings.   Electronically Signed   By: Marijo Sanes M.D.   On: 02/11/2015 12:40    EKG: Independently reviewed.  Assessment/Plan Principal Problem:   Acute respiratory failure Active Problems:   Acute on chronic renal failure   Will admit to floor with O2, IV steroids, IV ABX, and SVN's. Cultures sent. Pt is DNR. Follow sugars. Consult Pulmonary, Nephrology, CSW, and PT. Order echo. Repeat labs in AM.  Diet: carb modified Fluids: NS DVT Prophylaxis: SQ Heparin  Code Status: DNR  Family Communication: yes  Disposition Plan: SNF  Time spent: 50 min

## 2015-02-11 NOTE — ED Provider Notes (Signed)
[facility] Emergency Department Provider Note  Time seen: 12:03 PM  I have reviewed the triage vital signs and the nursing notes.   HISTORY  Chief Complaint Fever; Weakness; and Cough    HPI Susan Mccoy is a 81 y.o. female with a past medical history of hypertension, hyperlipidemia, GERD, frequent UTIs, CK D, diabetes, CHF who presents to the emergency department from her nursing facility with shaking, cough, and fever. According to the paramedics the patient had a fever of 100.4, was dosed Tylenol shortly before they left the nursing facility. They note the patient has been coughing for the past 3 days. Patient denies any other symptoms besides cough. Denies chest pain, denies abdominal pain, denies shortness of breath. Patient denies diarrhea or dysuria.     Past Medical History  Diagnosis Date  . Hypertension   . Thyroid disease   . Abnormal LFTs     Fatty liver by [place]  . Hypercholesterolemia   . Carpal tunnel syndrome   . DJD (degenerative joint disease)   . Shingles   . Meralgia paraesthetica   . GERD (gastroesophageal reflux disease)   . Pulmonary fibrosis   . Hemorrhoids   . Diverticulosis   . Normocytic normochromic anemia   . History of recurrent TIAs   . UTI (lower urinary tract infection)   . Chronic renal disease, stage 4, severely decreased glomerular filtration rate between 15-29 mL/min/1.73 square meter   . Anemia of chronic kidney failure   . Diabetes mellitus without complication     Type 2  . CHF (congestive heart failure)     pt not aware of this  . Bipolar disorder     in the past  . Constipation   . Macular degeneration   . Seizures     questionably had when she had TIA's, takes Primodone, states it has helped with the "shaking"  . Complication of anesthesia     trouble with putting her to sleep - 12/15 - pt doesn't remember this    Patient Active Problem List   Diagnosis Date Noted  . Mild cognitive impairment  09/22/2014  . Abnormality of gait 09/22/2014  . Adjustment disorder with anxious mood 09/03/2014  . Aspiration pneumonia 08/14/2014  . Difficulty swallowing 08/14/2014  . History of transient ischemic attack (TIA) 08/14/2014  . Diverticulosis of colon (without mention of hemorrhage) 08/08/2013  . Acute lower GI bleeding 08/07/2013  . Chronic renal disease, stage 4, severely decreased glomerular filtration rate between 15-29 mL/min/1.73 square meter 08/07/2013  . Acute posthemorrhagic anemia 08/07/2013  . Hypotension due to blood loss 08/07/2013  . CHF (congestive heart failure) 01/05/2012  . Hypertension 12/19/2011  . GERD (gastroesophageal reflux disease) 12/19/2011  . Hypoxia 12/18/2011  . Diabetes mellitus without complication 71/21/9758  . Anemia of chronic kidney failure 07/06/2008  . PULMONARY FIBROSIS ILD POST INFLAMMATORY CHRONIC 07/06/2008  . HYPERCHOLESTEROLEMIA 07/05/2008  . DEPRESSION 07/05/2008    Past Surgical History  Procedure Laterality Date  . Back surgery      X 2 - lumbar  . Tonsillectomy    . Cataract extraction, bilateral    . Breast biopsy  B/L- said to be negative for malignancy.  . Colonoscopy    . Colonoscopy N/A 08/08/2013    Procedure: COLONOSCOPY;  Surgeon: [name], MD;  Location: [place];  Service: Endoscopy;  Laterality: N/A;  EGD is possible  . Carpal tunnel release Left   . Eye surgery Bilateral  cataract surgery with lens implant  . Bascilic vein transposition Left 08/03/2014    Procedure:  INSERTION of Left Arm Gortex graft;  Surgeon: Angelia Mould, MD;  Location: [facility] OR;  Service: Vascular;  Laterality: Left;  . Esophagogastroduodenoscopy (egd) with propofol N/A 08/16/2014    Procedure: ESOPHAGOGASTRODUODENOSCOPY (EGD) WITH PROPOFOL;  Surgeon: Cleotis Nipper, MD;  Location: [place];  Service: Endoscopy;  Laterality: N/A;    Current Outpatient Rx  Name  Route  Sig  Dispense  Refill  . allopurinol (ZYLOPRIM) 300  MG tablet   Oral   Take 150 mg by mouth daily.         Marland Kitchen aspirin EC 81 MG tablet   Oral   Take 81 mg by mouth daily.         Marland Kitchen atenolol (TENORMIN) 25 MG tablet   Oral   Take 25 mg by mouth daily.          . calcitRIOL (ROCALTROL) 0.25 MCG capsule   Oral   Take 0.25 mcg by mouth every other day.         . cholecalciferol (VITAMIN D) 1000 UNITS tablet   Oral   Take 1,000 Units by mouth daily.         . Cyanocobalamin (VITAMIN B-12 IJ)   Injection   Inject 1 Syringe as directed every 30 (thirty) days. As directed. Due 08/19/2013         . cyclobenzaprine (FLEXERIL) 10 MG tablet   Oral   Take 5 mg by mouth at bedtime.         . ferrous sulfate 325 (65 FE) MG tablet   Oral   Take 325 mg by mouth daily with breakfast.         . glipiZIDE (GLUCOTROL) 10 MG tablet   Oral   Take 20 mg by mouth daily before breakfast.         . hydrOXYzine (ATARAX/VISTARIL) 25 MG tablet   Oral   Take 25 mg by mouth 2 (two) times daily as needed for itching (itching).          . lansoprazole (PREVACID) 30 MG capsule   Oral   Take 30 mg by mouth daily. Before a meal once a day         . Multiple Vitamins-Minerals (PRESERVISION AREDS PO)   Oral   Take 1 capsule by mouth 2 (two) times daily.         . primidone (MYSOLINE) 50 MG tablet   Oral   Take 50 mg by mouth 2 (two) times daily.         . sertraline (ZOLOFT) 50 MG tablet   Oral   Take 1 tablet (50 mg total) by mouth daily.   30 tablet   11   . simvastatin (ZOCOR) 20 MG tablet   Oral   Take 20 mg by mouth at bedtime.          . torsemide (DEMADEX) 20 MG tablet   Oral   Take 20 mg by mouth daily.           Allergies Morphine and related; Penicillins; Sulfonamide derivatives; Celebrex; and Valium  Family History  Problem Relation Age of Onset  . Emphysema Father     smoker  . Diabetes Father   . Varicose Veins Father   . Heart disease Father     before age 89  . Heart attack Father   .  Emphysema Brother  smoker  . Cancer Brother   . Varicose Veins Mother   . Cancer Sister   . Diabetes Sister   . Cancer Sister   . Cancer Brother     Social History History  Substance Use Topics  . Smoking status: Former Smoker -- 2.00 packs/day for 40 years    Quit date: 08/05/1991  . Smokeless tobacco: Never Used  . Alcohol Use: No    Review of Systems Constitutional: Negative for fever. Cardiovascular: Negative for chest pain. Respiratory: Negative for shortness of breath. Positive for cough. Gastrointestinal: Negative for abdominal pain Genitourinary: Negative for dysuria. Musculoskeletal: Negative for back pain. Neurological: Negative for headache 10-point ROS otherwise negative.  ____________________________________________   PHYSICAL EXAM:  VITAL SIGNS: ED Triage Vitals  Enc Vitals Group     BP 02/11/15 1201 152/55 mmHg     Pulse Rate 02/11/15 1201 92     Resp --      Temp 02/11/15 1201 101 F (38.3 C)     Temp Source 02/11/15 1201 Oral     SpO2 02/11/15 1201 98 %     Weight 02/11/15 1201 160 lb (72.576 kg)     Height 02/11/15 1201 5\' 2"  (1.575 m)     Head Cir --      Peak Flow --      Pain Score --      Pain Loc --      Pain Edu? --      Excl. in [place]? --     Constitutional: Alert and oriented. Well appearing and in no distress. Eyes: Normal exam ENT   Head: Normocephalic and atraumatic.   Nose: No congestion/rhinnorhea.   Mouth/Throat: Mucous membranes are moist. Cardiovascular: Normal rate, regular rhythm.  Respiratory: Normal respiratory effort without tachypnea nor retractions. Breath sounds are clear and equal bilaterally. No wheezes/rales/rhonchi. Gastrointestinal: Soft and nontender. No distention.  Musculoskeletal: Nontender with normal range of motion in all extremities. Neurologic:  Normal speech and language. No gross focal neurologic deficits  Skin:  Skin is warm, dry and intact.  Psychiatric: Mood and affect are normal.  Speech and behavior are normal. ____________________________________________    RADIOLOGY  Chest x-ray shows severe underlying disease with no definitive acute infiltrate.  ____________________________________________   INITIAL IMPRESSION / ASSESSMENT AND PLAN / ED COURSE  Pertinent labs & imaging results that were available during my care of the patient were reviewed by me and considered in my medical decision making (see chart for details).  Patient with cough 3 days, presents with shaking, and fever now 101 in the emergency department. We will check labs including blood work, blood cultures, urine, urine cultures, obtain a chest x-ray to help further evaluate.  Patient is febrile 11 in the emergency department with a white count of 18. Chest x-ray shows severe underlying disease with no definitive infiltrate, but given cough, fever, elevated white blood cell count we will cover the patient with antibiotics including vancomycin and Zosyn while awaiting for the results. Patient has not yet provided a urine sample which we continue to wait upon. Patient will require admission as she meets sepsis criteria.   Discussed with daughters who are agreeagle to admission.   They also state some left sided weakness and trouble ambulating x 5 days.  We will obtain a CT head as well.  Discussed with the hospitalist, we will admit.  Dr. Edd Fabian to follow up on UA and CT.   ____________________________________________   FINAL CLINICAL IMPRESSION(S) / ED DIAGNOSES  Fever  Cough Pneumonia  Harvest Dark, MD 02/11/15 [phone]

## 2015-02-11 NOTE — ED Notes (Signed)
Pt presents via EMS from [facility] c/o weakness, tremors, cough, and fever. Tylenol 650mg  give at 0930 by facility.

## 2015-02-11 NOTE — ED Notes (Signed)
Patient was given water to help the patient with providing a urine sample.

## 2015-02-11 NOTE — Progress Notes (Signed)
ANTIBIOTIC CONSULT NOTE - INITIAL  Pharmacy Consult for Levaquin Indication: pneumonia  Allergies  Allergen Reactions  . Morphine And Related Other (See Comments)    Very sensitive to all pain meds-narcotic   . Penicillins Other (See Comments)    REACTION: GI Upset  . Sulfonamide Derivatives Other (See Comments)    Unknown. Symptoms were when she was a teenager  . Celebrex [Celecoxib] Rash  . Valium [Diazepam] Other (See Comments)    Patient Measurements: Height: 5\' 2"  (157.5 cm) Weight: 142 lb 14.4 oz (64.819 kg) IBW/kg (Calculated) : 50.1 Adjusted Body Weight: n/a  Vital Signs: Temp: 100.4 F (38 C) (07/10 1723) Temp Source: Oral (07/10 1723) BP: 145/50 mmHg (07/10 1723) Pulse Rate: 94 (07/10 1723) Intake/Output from previous day:   Intake/Output from this shift: Total I/O In: -  Out: 600 [Urine:600]  Labs:  Recent Labs  02/11/15 1259  WBC 18.0*  HGB 9.5*  PLT 199  CREATININE 3.71*   Estimated Creatinine Clearance: 10.5 mL/min (by C-G formula based on Cr of 3.71). No results for input(s): VANCOTROUGH, VANCOPEAK, VANCORANDOM, GENTTROUGH, GENTPEAK, GENTRANDOM, TOBRATROUGH, TOBRAPEAK, TOBRARND, AMIKACINPEAK, AMIKACINTROU, AMIKACIN in the last 72 hours.   Microbiology: No results found for this or any previous visit (from the past 720 hour(s)).  Medical History: Past Medical History  Diagnosis Date  . Hypertension   . Thyroid disease   . Abnormal LFTs     Fatty liver by [place]  . Hypercholesterolemia   . Carpal tunnel syndrome   . DJD (degenerative joint disease)   . Shingles   . Meralgia paraesthetica   . GERD (gastroesophageal reflux disease)   . Pulmonary fibrosis   . Hemorrhoids   . Diverticulosis   . Normocytic normochromic anemia   . History of recurrent TIAs   . UTI (lower urinary tract infection)   . Chronic renal disease, stage 4, severely decreased glomerular filtration rate between 15-29 mL/min/1.73 square meter   . Anemia of chronic kidney  failure   . Diabetes mellitus without complication     Type 2  . CHF (congestive heart failure)     pt not aware of this  . Bipolar disorder     in the past  . Constipation   . Macular degeneration   . Seizures     questionably had when she had TIA's, takes Primodone, states it has helped with the "shaking"  . Complication of anesthesia     trouble with putting her to sleep - 12/15 - pt doesn't remember this    Medications:  Anti-infectives    Start     Dose/Rate Route Frequency Ordered Stop   02/13/15 1800  Levofloxacin (LEVAQUIN) IVPB 250 mg     250 mg 50 mL/hr over 60 Minutes Intravenous Every 48 hours 02/11/15 1801     02/11/15 1815  levofloxacin (LEVAQUIN) IVPB 500 mg     500 mg 100 mL/hr over 60 Minutes Intravenous  Once 02/11/15 1801     02/11/15 1545  levofloxacin (LEVAQUIN) IVPB 500 mg  Status:  Discontinued     500 mg 100 mL/hr over 60 Minutes Intravenous Every 24 hours 02/11/15 1543 02/11/15 1800   02/11/15 1422  vancomycin (VANCOCIN) 1 GM/200ML IVPB    Comments:  ORE, HUNTER: cabinet override      02/11/15 1422 02/11/15 1443   02/11/15 1400  vancomycin (VANCOCIN) injection 1,000 mg  Status:  Discontinued     1,000 mg Intravenous  Once 02/11/15 1353 02/11/15 1354   02/11/15  1400  piperacillin-tazobactam (ZOSYN) IVPB 3.375 g     3.375 g 12.5 mL/hr over 240 Minutes Intravenous  Once 02/11/15 1353 02/11/15 1641   02/11/15 1400  vancomycin (VANCOCIN) IVPB 1000 mg/200 mL premix     1,000 mg 200 mL/hr over 60 Minutes Intravenous  Once 02/11/15 1355 02/11/15 1600     Assessment: Patient admitted from nursing facility for respiratory failure. Empirically started on Levaquin 500mg  IV q24h for possible CAP.  Goal of Therapy:  Resolution of symptoms  Plan:  Follow up culture results Will renally adjust dose to Levaquin 500mg  IV once followed by 250mg  IV q48h.  Paulina Fusi, PharmD, BCPS 02/11/2015 6:04 PM

## 2015-02-11 NOTE — ED Notes (Signed)
Patient able to give urine sample. Daughter at bedside. Patient to be admitted.

## 2015-02-12 ENCOUNTER — Inpatient Hospital Stay
Admit: 2015-02-12 | Discharge: 2015-02-12 | Disposition: A | Discharge status: Home / Self Care | Attending: Internal Medicine | Admitting: Internal Medicine

## 2015-02-12 ENCOUNTER — Inpatient Hospital Stay

## 2015-02-12 LAB — CBC
HCT: 27.9 % — ABNORMAL LOW (ref 35.0–47.0)
Hemoglobin: 9.1 g/dL — ABNORMAL LOW (ref 12.0–16.0)
MCH: 32.3 pg (ref 26.0–34.0)
MCHC: 32.4 g/dL (ref 32.0–36.0)
MCV: 99.7 fL (ref 80.0–100.0)
Platelets: 188 10*3/uL (ref 150–440)
RBC: 2.8 MIL/uL — ABNORMAL LOW (ref 3.80–5.20)
RDW: 15.2 % — AB (ref 11.5–14.5)
WBC: 14.3 10*3/uL — AB (ref 3.6–11.0)

## 2015-02-12 LAB — GLUCOSE, CAPILLARY
GLUCOSE-CAPILLARY: 126 mg/dL — AB (ref 65–99)
GLUCOSE-CAPILLARY: 179 mg/dL — AB (ref 65–99)
GLUCOSE-CAPILLARY: 209 mg/dL — AB (ref 65–99)
Glucose-Capillary: 254 mg/dL — ABNORMAL HIGH (ref 65–99)

## 2015-02-12 LAB — BASIC METABOLIC PANEL
Anion gap: 11 (ref 5–15)
BUN: 77 mg/dL — AB (ref 6–20)
CALCIUM: 8.1 mg/dL — AB (ref 8.9–10.3)
CO2: 24 mmol/L (ref 22–32)
CREATININE: 3.6 mg/dL — AB (ref 0.44–1.00)
Chloride: 95 mmol/L — ABNORMAL LOW (ref 101–111)
GFR calc Af Amer: 13 mL/min — ABNORMAL LOW (ref >60)
GFR, EST NON AFRICAN AMERICAN: 11 mL/min — AB (ref >60)
Glucose, Bld: 137 mg/dL — ABNORMAL HIGH (ref 65–99)
Potassium: 4.3 mmol/L (ref 3.5–5.1)
Sodium: 130 mmol/L — ABNORMAL LOW (ref 135–145)

## 2015-02-12 MED ORDER — GABAPENTIN 300 MG PO CAPS
300.0000 mg | ORAL_CAPSULE | Freq: Once | ORAL | Status: AC
  Administered 2015-02-12: 300 mg via ORAL
  Filled 2015-02-12: qty 1

## 2015-02-12 MED ORDER — GUAIFENESIN 100 MG/5ML PO SYRP
200.0000 mg | ORAL_SOLUTION | ORAL | Status: DC | PRN
  Administered 2015-02-12: 200 mg via ORAL
  Filled 2015-02-12 (×2): qty 10

## 2015-02-12 MED ORDER — CALCITRIOL 0.25 MCG PO CAPS: 0.2500 ug | ORAL_CAPSULE | ORAL | Status: DC

## 2015-02-12 MED ORDER — INSULIN ASPART 100 UNIT/ML ~~LOC~~ SOLN
0.0000 [IU] | Freq: Three times a day (TID) | SUBCUTANEOUS | Status: DC
  Administered 2015-02-12: 5 [IU] via SUBCUTANEOUS
  Administered 2015-02-13 (×2): 3 [IU] via SUBCUTANEOUS
  Administered 2015-02-13: 2 [IU] via SUBCUTANEOUS
  Filled 2015-02-12: qty 3
  Filled 2015-02-12: qty 5
  Filled 2015-02-12: qty 2
  Filled 2015-02-12: qty 3

## 2015-02-12 MED ORDER — ALLOPURINOL 100 MG PO TABS
100.0000 mg | ORAL_TABLET | Freq: Every day | ORAL | Status: DC
  Administered 2015-02-13: 100 mg via ORAL
  Filled 2015-02-12: qty 1

## 2015-02-12 NOTE — Progress Notes (Signed)
Visit made. Patient is followed by [facility] [place] with a hospice dx of CKD stage V and is a DNR. Out of facility DNR in place on patient's chart.  Patient seen sitting up in bed, alert, oriented. She reports feeling better, ate 100% of breakfast and has been up to the [facility] with assistance. She reported some pain in her right arm, dry cough noted, patient also asked for cough medication. Staff RN Manuela Schwartz notified by Probation officer.  Per chart review patient is currently receiving IV antibiotics and IV steroids. WBC's 14.3, down from 18 on admission. No family present at time of visit. Writer updated patient's home SW who plans to contact patient's daughter Juliann Pulse. Will continue to follow. Flo Shanks RN, BSN, [facility] [place], [facility] [phone] c

## 2015-02-12 NOTE — Progress Notes (Signed)
Pt complaining of right arm pain.  She reports she has been having this pain since this morning.  PRN medication given.  Pt reports the pain is "deep to the bone".  It is not tender to touch, she has full range of motion, and color is the same in bilateral arms.  Pt does have bruising to her right arm from a fall 4 weeks ago.  Will continue to monitor.

## 2015-02-12 NOTE — Progress Notes (Signed)
Pt remains a/o. Having some rt arm pain. Med with norco and flexeril and warmed blanket placed on arm. She has full r.o.m. Of extremity. No focal tenderness. No hot areas. Appetite only fair. Pt voiding in b.r. Single assist required. Worked with p.t. vss.

## 2015-02-12 NOTE — Care Management (Signed)
Patient is from [facility] and is currently under the care [facility].  Patient says that her daughter obtained hospic services "to help me with my bath."  Patient has a fistula for hemodialysis that has never been used.

## 2015-02-12 NOTE — Progress Notes (Signed)
Initial Nutrition Assessment  INTERVENTION:  Meals and Snacks: Cater to patient preferences Medical Food Supplement Therapy: will recommend Sugar Free Mighty Shakes on meal trays BID for added nutrition (each shake provides 300kcals and 9g protein)    NUTRITION DIAGNOSIS:  Inadequate oral intake related to acute illness as evidenced by meal completion < 25%.  GOAL:  Patient will meet greater than or equal to 90% of their needs  MONITOR:   (Energy Intake, Anthropometrics, Electrolyte and Renal Profile)  REASON FOR ASSESSMENT:  Malnutrition Screening Tool    ASSESSMENT:  Pt admitted with fever and weakness with acute respiratory failure and acute on chronic renal failure. Pt reports followed by hospice care PTA.  PMHx:  Past Medical History  Diagnosis Date  . Hypertension   . Thyroid disease   . Abnormal LFTs     Fatty liver by [place]  . Hypercholesterolemia   . Carpal tunnel syndrome   . DJD (degenerative joint disease)   . Shingles   . Meralgia paraesthetica   . GERD (gastroesophageal reflux disease)   . Pulmonary fibrosis   . Hemorrhoids   . Diverticulosis   . Normocytic normochromic anemia   . History of recurrent TIAs   . UTI (lower urinary tract infection)   . Chronic renal disease, stage 4, severely decreased glomerular filtration rate between 15-29 mL/min/1.73 square meter   . Anemia of chronic kidney failure   . Diabetes mellitus without complication     Type 2  . CHF (congestive heart failure)     pt not aware of this  . Bipolar disorder     in the past  . Constipation   . Macular degeneration   . Seizures     questionably had when she had TIA's, takes Primodone, states it has helped with the "shaking"  . Complication of anesthesia     trouble with putting her to sleep - 12/15 - pt doesn't remember this    Diet Order:  Diet Carb Modified Fluid consistency:: Thin; Room service appropriate?: Yes  Current Nutrition: Pt reports not eating but a bite of  lunch today; reports didn't want to eat and taste preferences as well. PO Intake 100% of breakfast as pt reported always eats better at breakfast.  Food/Nutrition-Related History: Pt reports having 3 meals per day PTA. Pt reports poor appetite but difficult to assess intake recall. Pt reports drinking Ensure a long time ago.   Medications: cholecalciferol, colace, ferrous sulfate, glucotrol, novolog  Electrolyte/Renal Profile and Glucose Profile:   Recent Labs Lab 02/11/15 1259 02/12/15 0549  NA 132* 130*  K 4.4 4.3  CL 97* 95*  CO2 24 24  BUN 75* 77*  CREATININE 3.71* 3.60*  CALCIUM 8.3* 8.1*  GLUCOSE 113* 137*   Protein Profile:   Recent Labs Lab 02/11/15 1259  ALBUMIN 3.3*    Gastrointestinal Profile: Last BM: 7/9   Nutrition-Focused Physical Exam Findings: Nutrition-Focused physical exam completed. Findings are WDL for fat depletion, muscle depletion, and edema.    Weight Change: Pt somewhat confused reports weight has been gaining and then later in conversation weight is down. Pt reports weighing 130lbs. Current weight 137lbs. Anthropometrics:  Height:  Ht Readings from Last 1 Encounters:  02/11/15 5\' 2"  (1.575 m)    Weight:  Wt Readings from Last 1 Encounters:  02/12/15 137 lb 1.6 oz (62.188 kg)    Wt Readings from Last 10 Encounters:  02/12/15 137 lb 1.6 oz (62.188 kg)  09/21/14 125 lb (56.7 kg)  08/21/14 128 lb (58.06 kg)  08/18/14 132 lb 11.5 oz (60.2 kg)  08/03/14 128 lb 9 oz (58.316 kg)  07/19/14 128 lb 9.6 oz (58.333 kg)  08/09/13 147 lb 4.3 oz (66.8 kg)  07/25/13 128 lb (58.06 kg)  01/06/12 136 lb 3.9 oz (61.8 kg)  12/22/11 135 lb 12.8 oz (61.598 kg)    BMI:  Body mass index is 25.07 kg/(m^2).  Estimated Nutritional Needs:  Kcal:  1372-1622kcals, BEE: 1040kcals, TEE: (IF 1.1-1.3)(AF 1.2)   Protein:  62-75g protein (1.0-1.2g/kg)  Fluid:  1553-1857mL of fluid (25-57mL/kg)  Skin:  Reviewed, no issues  EDUCATION NEEDS:  Education  needs no appropriate at this time   [place], RD, LDN Pager [phone]

## 2015-02-12 NOTE — Progress Notes (Signed)
Date: 02/12/2015,   MRN# [id number] Susan Mccoy 79-26-1933 Code Status:     Code Status Orders        Start     Ordered   02/11/15 1715  Do not attempt resuscitation (DNR)   Continuous    Question Answer Comment  In the event of cardiac or respiratory ARREST Do not call a "code blue"   In the event of cardiac or respiratory ARREST Do not perform Intubation, CPR, defibrillation or ACLS   In the event of cardiac or respiratory ARREST Use medication by any route, position, wound care, and other measures to relive pain and suffering. May use oxygen, suction and manual treatment of airway obstruction as needed for comfort.      02/11/15 1714    Advance Directive Documentation        Most Recent Value   Type of Advance Directive  Healthcare Power of Attorney   Pre-existing out of facility DNR order (yellow form or pink MOST form)  Yellow form placed in chart (order not valid for inpatient use)   "MOST" Form in Place?       Hosp day:@LENGTHOFSTAYDAYS @ Referring MD: @ATDPROV @     PCP:      AdmissionWeight: 160 lb (72.576 kg)                 CurrentWeight: 137 lb 1.6 oz (62.188 kg)  CC: sob  HPI: this is an 81 year old lady here with fever, sob, cough, hypoxic, dehydrated  being treated for copd exacerbation with some improvement. She is also known to have pulmonary fibrosis. (chest ct 2009 showed UIP type fibrosis) She is DNR,fair-poor historian. She denies rashes, nodes, hemoptysis, leg pain, right arm is in pain. No gross hematuria, pleurisy, ectopy or syncope   PMHX:   Past Medical History  Diagnosis Date  . Hypertension   . Thyroid disease   . Abnormal LFTs     Fatty liver by [place]  . Hypercholesterolemia   . Carpal tunnel syndrome   . DJD (degenerative joint disease)   . Shingles   . Meralgia paraesthetica   . GERD (gastroesophageal reflux disease)   . Pulmonary fibrosis   . Hemorrhoids   . Diverticulosis   . Normocytic normochromic anemia   . History of recurrent  TIAs   . UTI (lower urinary tract infection)   . Chronic renal disease, stage 4, severely decreased glomerular filtration rate between 15-29 mL/min/1.73 square meter   . Anemia of chronic kidney failure   . Diabetes mellitus without complication     Type 2  . CHF (congestive heart failure)     pt not aware of this  . Bipolar disorder     in the past  . Constipation   . Macular degeneration   . Seizures     questionably had when she had TIA's, takes Primodone, states it has helped with the "shaking"  . Complication of anesthesia     trouble with putting her to sleep - 12/15 - pt doesn't remember this   Surgical Hx:  Past Surgical History  Procedure Laterality Date  . Back surgery      X 2 - lumbar  . Tonsillectomy    . Cataract extraction, bilateral    . Breast biopsy  B/L- said to be negative for malignancy.  . Colonoscopy    . Colonoscopy N/A 08/08/2013    Procedure: COLONOSCOPY;  Surgeon: [name], MD;  Location: [facility];  Service: Endoscopy;  Laterality: N/A;  EGD is possible  . Carpal tunnel release Left   . Eye surgery Bilateral     cataract surgery with lens implant  . Bascilic vein transposition Left 08/03/2014    Procedure:  INSERTION of Left Arm Gortex graft;  Surgeon: Angelia Mould, MD;  Location: [facility] OR;  Service: Vascular;  Laterality: Left;  . Esophagogastroduodenoscopy (egd) with propofol N/A 08/16/2014    Procedure: ESOPHAGOGASTRODUODENOSCOPY (EGD) WITH PROPOFOL;  Surgeon: Cleotis Nipper, MD;  Location: [place] [facility];  Service: Endoscopy;  Laterality: N/A;   Family Hx:  Family History  Problem Relation Age of Onset  . Emphysema Father     smoker  . Diabetes Father   . Varicose Veins Father   . Heart disease Father     before age 61  . Heart attack Father   . Emphysema Brother     smoker  . Cancer Brother   . Varicose Veins Mother   . Cancer Sister   . Diabetes Sister   . Cancer Sister   . Cancer Brother    Social Hx:   History   Substance Use Topics  . Smoking status: Former Smoker -- 2.00 packs/day for 40 years    Quit date: 08/05/1991  . Smokeless tobacco: Never Used  . Alcohol Use: No   Medication:    Home Medication:  No current outpatient prescriptions on file.  Current Medication: @CURMEDTAB @   Allergies:  Morphine and related; Penicillins; Sulfonamide derivatives; Celebrex; and Valium  Review of Systems: Gen:  Denies  fever, sweats, chills HEENT: Denies blurred vision, double vision, ear pain, eye pain, hearing loss, nose bleeds, sore throat Cvc:  No dizziness, chest pain or heaviness Resp:  Still having bouts of sob,    Gi: Denies swallowing difficulty, stomach pain, nausea or vomiting, diarrhea, constipation, bowel incontinence Gu:  Denies bladder incontinence, burning urine Ext:   No Joint pain, stiffness or swelling Skin: No skin rash, easy bruising or bleeding or hives Endoc:  No polyuria, polydipsia , polyphagia or weight change Psych: No depression, insomnia or hallucinations  Other:  All other systems negative  Physical Examination:   VS: BP 165/65 mmHg  Pulse 100  Temp(Src) 98 F (36.7 C) (Oral)  Resp 19  Ht 5\' 2"  (1.575 m)  Wt 137 lb 1.6 oz (62.188 kg)  BMI 25.07 kg/m2  SpO2 96%  General Appearance: mild distress, she was on the phone  Neuropsych without focal findings, mental status, speech normal, alert and oriented, cranial nerves 2-12 intact, reflexes normal and symmetric, sensation grossly normal  HEENT: PERRLA, EOM intact, no ptosis, no other lesions noticed Neck Supple, no stridor, nodes Pulmonary:.No wheezing, +ve  rales     Cardiovascular:  Normal S1,S2.  No m/r/g.  Abdominal aorta pulsation normal.    Abdomen:Benign, Soft, non-tender, No masses, hepatosplenomegaly, No lymphadenopathy Endoc: No evident thyromegaly, no signs of acromegaly or Cushing features Skin:   warm, no rashes, no ecchymosis  Extremities: normal, no cyanosis, clubbing, no edema, warm with  normal capillary refill. Other findings:   Labs results:   Recent Labs     02/11/15  1259  02/12/15  0549  HGB  9.5*  9.1*  HCT  29.6*  27.9*  MCV  99.8  99.7  WBC  18.0*  14.3*  BUN  75*  77*  CREATININE  3.71*  3.60*  GLUCOSE  113*  137*  CALCIUM  8.3*  8.1*  ,     Assessment and Plan: Here  with dyspnea, due combination copd, uip, as a result hypoxia, she did fever earlier with leukocytosis ? Early underlying infection ( pneumonia, gu etc). DNR, dementia  -continue present level of care -follow wbc -hold on ofev -wean 02 as tolerated -repeat cxr with hydration, if present pneumonia ill blossom -following    I have personally obtained a history, examined the patient, evaluated laboratory and imaging results, formulated the assessment and plan and placed orders.  The Patient requires high complexity decision making for assessment and support, frequent evaluation and titration of therapies, application of advanced monitoring technologies and extensive interpretation of multiple databases.   Yanci Bachtell,M.D. Pulmonary & Critical care Medicine [facility]

## 2015-02-12 NOTE — Clinical Social Work Note (Signed)
Clinical Social Work Assessment  Patient Details  Name: Susan Mccoy MRN: [id number] Date of Birth: 1931/11/25  Date of referral:  02/12/15               Reason for consult:  Facility Placement, End of Life/Hospice                Permission sought to share information with:    Permission granted to share information::     Name::        Agency::     Relationship::     Contact Information:     Housing/Transportation Living arrangements for the past 2 months:  [place] of Information:  Adult Children Patient Interpreter Needed:  None Criminal Activity/Legal Involvement Pertinent to Current Situation/Hospitalization:    Significant Relationships:  Adult Children Lives with:  Facility Resident Do you feel safe going back to the place where you live?  Yes Need for family participation in patient care:  Yes (Comment)  Care giving concerns:  Patient resides at [facility] and has [facility] following. Patient is alert and oriented to person and at times place but is reported as being confused.   Social Worker assessment / plan:  CSW spoke with patient's daughter and POA: Susan Mccoy: [phone] at length on the phone this afternoon. Patient's daughter is out of town currently in [place] but will be back in town in a few days. Ms. Susan Mccoy had many concerns about the care between [facility] and Hospice and not getting orders in a timely manner and not treating her mother when she had a fever for a few days. [name] has already addressed this with both [facility] and Hospice and is wanting to make sure that when patient discharges back to [facility], that patient's orders are very specific including when it involves her medications. [name] states that both patient and patient's husband are at [facility] and under hospice. [name] did state that she wanted patient to return.   CSW spoke with Susan Mccoy at [facility] and they can take patient back at  discharge. Hospice is aware as well.   Employment status:  Retired Forensic scientist:  Medicare PT Recommendations:  Home with [place] / Referral to community resources:     Patient/Family's Response to care:  Appreciative for CSW assistance.  Patient/Family's Understanding of and Emotional Response to Diagnosis, Current Treatment, and Prognosis:  Patient's daughter is very involved with her care.   Emotional Assessment Appearance:    Attitude/Demeanor/Rapport:    Affect (typically observed):    Orientation:  Oriented to Self, Oriented to Place Alcohol / Substance use:  Not Applicable Psych involvement (Current and /or in the community):  No (Comment)  Discharge Needs  Concerns to be addressed:  Care Coordination Readmission within the last 30 days:  No Current discharge risk:  None Barriers to Discharge:  No Barriers Identified   Shela Leff, LCSW 02/12/2015, 3:07 PM

## 2015-02-12 NOTE — Evaluation (Addendum)
Physical Therapy Evaluation Patient Details Name: Susan Mccoy MRN: [id number] DOB: Nov 20, 1931 Today's Date: 02/12/2015   History of Present Illness  Presented with SOB and excessive cough and progressive weakness; admitted due to acute respiratory failure secondary to PNA.   Clinical Impression  Pt requires min assist with bed mobility secondary to generalized weakness.  Pt requires min assist and uses a rolling walker with sit to stand transfers, requires verbal cuing to bring her feet towards the bed/increase trunk flexion. Pt ambulates 200 ft on RW with min assist from PT, presents with festinating gait and step length on L side is shorter than R secondary to decreased hip shifting during R stance phase. Pt is willing and eager to get out of the bed and wants to move around, states that this is the weakest she's ever felt. Pt would benefit from skilled PT to improve with bed mobility/sit-to-stand transfers/gait pattern.  Pt would be best suited returning to her original assisted living facility with home health PT.     Follow Up Recommendations Home health PT    Equipment Recommendations  Rolling walker with 5" wheels    Recommendations for Other Services       Precautions / Restrictions Precautions Precautions: Fall No BP L UE (AVF) Restrictions Weight Bearing Restrictions: No      Mobility  Bed Mobility Overal bed mobility: Needs Assistance Bed Mobility: Supine to Sit;Sidelying to Sit   Sidelying to sit: Min assist;Min guard Supine to sit: Min guard;Min assist  cuing for hand placement and overall movement transition; assist for scooting forward towards edge of bed      Transfers Overall transfer level: Needs assistance Equipment used: Rolling walker (2 wheeled);1 person hand held assist Transfers: Sit to/from Stand Sit to Stand: Mod assist         General transfer comment: Pt required verbal cuing for trunk flexion and to move her feet towards the bed. Pt  doesn't flex her trunk as much secondary to fear of falling.   Ambulation/Gait Ambulation/Gait assistance: Min assist Ambulation Distance (Feet): 200 Feet Assistive device: Rolling walker (2 wheeled) Gait Pattern/deviations: Step-to pattern;Decreased step length - left;Decreased weight shift to right;Festinating Gait velocity: .5 meters per sec Gait velocity interpretation: <1.8 ft/sec, indicative of risk for recurrent falls    Stairs            Wheelchair Mobility    Modified Rankin (Stroke Patients Only)       Balance Overall balance assessment: Needs assistance   Sitting balance-Leahy Scale: Fair Sitting balance - Comments: Pt. is able to sit up w/ assist from bed rail.  Pt. diplays a slight R trunk lean in sitting but returns to neutral c verbal cuing.  Postural control: Right lateral lean   Standing balance-Leahy Scale: Poor Standing balance comment: Pt. requires assist from either the PT or RW to remain standing.  Pt fatigues quickly as seen by an unexpected sit at the recliner chair, requiring min/mod assist from therapist for safety.                             Pertinent Vitals/Pain      Home Living Family/patient expects to be discharged to:: Assisted living               Home Equipment: Gilford Rile - 2 wheels Additional Comments: Lives with husband and wants to return to her assisted living facility.     Prior Function Level  of Independence: Independent with assistive device(s)   Gait / Transfers Assistance Needed: Ambulates w/ RW           Hand Dominance   Dominant Hand: Right    Extremity/Trunk Assessment   Upper Extremity Assessment: Overall WFL for tasks assessed           Lower Extremity Assessment: Overall WFL for tasks assessed (Bilat Df 4/5, bilat knee extension 4/5.)      Cervical / Trunk Assessment: Other exceptions;Kyphotic  Communication   Communication: No difficulties  Cognition Arousal/Alertness:  Awake/alert Behavior During Therapy: WFL for tasks assessed/performed Overall Cognitive Status: Within Functional Limits for tasks assessed (Pt reponses to PT questions were slower than normal and PT had to repeat questions intermittently. )                      General Comments      Exercises Total Joint Exercises Goniometric ROM: UE/LE ROM remains WFL.       Assessment/Plan    PT Assessment Patient needs continued PT services  PT Diagnosis Difficulty walking;Abnormality of gait;Generalized weakness   PT Problem List Decreased strength;Decreased activity tolerance;Decreased balance;Decreased mobility;Decreased coordination;Decreased cognition;Decreased knowledge of use of DME;Decreased safety awareness;Decreased knowledge of precautions;Cardiopulmonary status limiting activity  PT Treatment Interventions DME instruction;Gait training;Functional mobility training;Therapeutic activities;Therapeutic exercise;Balance training;Neuromuscular re-education;Cognitive remediation;Patient/family education   PT Goals (Current goals can be found in the Care Plan section) Acute Rehab PT Goals Patient Stated Goal: I want to return home PT Goal Formulation: With patient Time For Goal Achievement: 02/26/15 Potential to Achieve Goals: Good    Frequency Min 2X/week   Barriers to discharge Decreased caregiver support      Co-evaluation               End of Session Equipment Utilized During Treatment: Gait belt;Oxygen Activity Tolerance: Patient tolerated treatment well Patient left: in chair;with call bell/phone within reach;with bed alarm set;with chair alarm set;with family/visitor present           Time: 6269-4854 PT Time Calculation (min) (ACUTE ONLY): 56 min   Charges:   PT Evaluation $Initial PT Evaluation Tier I: 1 Procedure     PT G Codes:        Evaluation completed and transcribed by Zannie Kehr, SPT at above date and time.  Supervised and co-signed by  Tera Helper, PT  Reyes Ivan. Owens Shark, PT, DPT, NCS 02/12/2015, 1:43 PM [phone]

## 2015-02-12 NOTE — Progress Notes (Signed)
MD, Dr. Jannifer Franklin notified.  Pt still complaining of pain in her arm, Norco did not help.  MD to place order for xray of right arm.  Will reassess after xray results.  Pt's blood sugar 254 with no night time coverage, MD ordered SSI with bedtime coverage.  Will continue to monitor.

## 2015-02-12 NOTE — Progress Notes (Signed)
Pt right arm xrays resulted.  Nothing acute.  Dr. Jannifer Franklin called to give results, pt still complaining of pain.  MD prescribed gabapentin x 1 to see if it is nerve related pain.  Will continue to monitor

## 2015-02-12 NOTE — Progress Notes (Signed)
*  PRELIMINARY RESULTS* Echocardiogram 2D Echocardiogram has been performed.  Susan Mccoy 02/12/2015, 10:20 AM

## 2015-02-12 NOTE — Progress Notes (Addendum)
[facility] [place] NAME: Susan Mccoy    MR#:  [id number]  DATE OF BIRTH:  Mar 02, 1932  SUBJECTIVE:  CHIEF COMPLAINT:   Chief Complaint  Patient presents with  . Fever  . Weakness  . Cough    The patient presented with cough, sputum production, fever and weakness. Chest x-ray did not show any pneumonia. Patient is admitted for COPD exacerbation, acute on chronic respiratory failure. She was also noted to have renal failure, acute on chronic and nephrologist consultation was obtained despite patient being on hospice service at home. She does not remember who is her primary nephrologist, and what the recommendations. They had. She is not really sure if she was under hospice care.   Review of Systems  Constitutional: Negative for fever, chills and weight loss.  HENT: Negative for congestion.   Eyes: Negative for blurred vision and double vision.  Respiratory: Positive for cough, sputum production and shortness of breath. Negative for wheezing.   Cardiovascular: Negative for chest pain, palpitations, orthopnea, leg swelling and PND.  Gastrointestinal: Negative for nausea, vomiting, abdominal pain, diarrhea, constipation and blood in stool.  Genitourinary: Negative for dysuria, urgency, frequency and hematuria.  Musculoskeletal: Negative for falls.  Neurological: Negative for dizziness, tremors, focal weakness and headaches.  Endo/Heme/Allergies: Does not bruise/bleed easily.  Psychiatric/Behavioral: Negative for depression. The patient does not have insomnia.     VITAL SIGNS: Blood pressure 151/53, pulse 80, temperature 97.6 F (36.4 C), temperature source Oral, resp. rate 20, height 5\' 2"  (1.575 m), weight 62.188 kg (137 lb 1.6 oz), SpO2 98 %.  PHYSICAL EXAMINATION:   GENERAL:  81 y.o.-year-old patient lying in the bed with no acute distress. Demented and not able to communicate much  EYES: Pupils equal, round, reactive to light and  accommodation. No scleral icterus. Extraocular muscles intact.  HEENT: Head atraumatic, normocephalic. Oropharynx and nasopharynx clear.  NECK:  Supple, no jugular venous distention. No thyroid enlargement, no tenderness.  LUNGS: Diminished breath sounds bilaterally, no wheezing, rales,rhonchi , but bilateral crepitations were noted. Diffuse . No use of accessory muscles of respiration. Intermittently takes off of her nasal cannulas and the is not in acute distress. There is no oxygen at all CARDIOVASCULAR: S1, S2 normal. No murmurs, rubs, or gallops.  ABDOMEN: Soft, nontender, nondistended. Bowel sounds present. No organomegaly or mass.  EXTREMITIES: No pedal edema, cyanosis, or clubbing.  NEUROLOGIC: Cranial nerves II through XII are intact. Muscle strength 5/5 in all extremities. Sensation intact. Gait not checked.  PSYCHIATRIC: The patient is alert and oriented x 2.  SKIN: No obvious rash, lesion, or ulcer.   ORDERS/RESULTS REVIEWED:   CBC  Recent Labs Lab 02/11/15 1259 02/12/15 0549  WBC 18.0* 14.3*  HGB 9.5* 9.1*  HCT 29.6* 27.9*  PLT 199 188  MCV 99.8 99.7  MCH 32.0 32.3  MCHC 32.1 32.4  RDW 15.6* 15.2*  LYMPHSABS 1.7  --   MONOABS 1.8*  --   EOSABS 0.2  --   BASOSABS 0.1  --    ------------------------------------------------------------------------------------------------------------------  Chemistries   Recent Labs Lab 02/11/15 1259 02/12/15 0549  NA 132* 130*  K 4.4 4.3  CL 97* 95*  CO2 24 24  GLUCOSE 113* 137*  BUN 75* 77*  CREATININE 3.71* 3.60*  CALCIUM 8.3* 8.1*  AST 22  --   ALT 22  --   ALKPHOS 75  --   BILITOT 0.3  --    ------------------------------------------------------------------------------------------------------------------ estimated creatinine clearance  is 10.6 mL/min (by C-G formula based on Cr of 3.6). ------------------------------------------------------------------------------------------------------------------ No results for  input(s): TSH, T4TOTAL, T3FREE, THYROIDAB in the last 72 hours.  Invalid input(s): FREET3  Cardiac Enzymes No results for input(s): CKMB, TROPONINI, MYOGLOBIN in the last 168 hours.  Invalid input(s): CK ------------------------------------------------------------------------------------------------------------------ Invalid input(s): POCBNP ---------------------------------------------------------------------------------------------------------------  RADIOLOGY: Dg Chest Portable 1 View  02/11/2015   CLINICAL DATA:  Cough and fever for 2 days.  EXAM: PORTABLE CHEST - 1 VIEW  COMPARISON:  08/07/2013.  FINDINGS: The cardiac silhouette, mediastinal and hilar contours are mildly prominent but unchanged. There is tortuosity and calcification of the thoracic aorta. Severe chronic lung disease without definite acute overlying pulmonary process.  IMPRESSION: Severe chronic lung disease without definite acute overlying pulmonary findings.   Electronically Signed   By: Marijo Sanes M.D.   On: 02/11/2015 12:40    EKG:  Orders placed or performed during the hospital encounter of 08/14/14  . EKG 12-Lead  . EKG 12-Lead  . EKG    ASSESSMENT AND PLAN:  Principal Problem:   Acute respiratory failure Active Problems:   Acute on chronic renal failure   Acute on chronic respiratory failure 1. Acute on chronic respiratory failure due to acute bronchitis, COPD exacerbation, continue patient on Levaquin, steroids, inhalation therapy, relatively stable. Continue oxygen therapy as needed, keeping her pulse oximeter at around 94% 2. Acute bronchitis,  levofloxacin. Sputum cultures are requested 3. COPD exacerbation. Continue steroids as well as inhalation therapy 4. Acute on chronic renal failure, holding diuretics, following kidney function, getting ultrasound of kidneys. Nephrology consultation was requested, however, since patient is under hospice care,  supportive therapy is recommended 5.  Hyponatremia, likely due to  Lasix, although worse without diuretic, following in the morning, could be related to IV fluid administration 6. Leukocytosis, seemed to be improving with therapy  Management plans discussed with the patient, family and they are in agreement.   DRUG ALLERGIES:  Allergies  Allergen Reactions  . Morphine And Related Other (See Comments)    Very sensitive to all pain meds-narcotic   . Penicillins Other (See Comments)    REACTION: GI Upset  . Sulfonamide Derivatives Other (See Comments)    Unknown. Symptoms were when she was a teenager  . Celebrex [Celecoxib] Rash  . Valium [Diazepam] Other (See Comments)    CODE STATUS:     Code Status Orders        Start     Ordered   02/11/15 1715  Do not attempt resuscitation (DNR)   Continuous    Question Answer Comment  In the event of cardiac or respiratory ARREST Do not call a "code blue"   In the event of cardiac or respiratory ARREST Do not perform Intubation, CPR, defibrillation or ACLS   In the event of cardiac or respiratory ARREST Use medication by any route, position, wound care, and other measures to relive pain and suffering. May use oxygen, suction and manual treatment of airway obstruction as needed for comfort.      02/11/15 1714    Advance Directive Documentation        Most Recent Value   Type of Advance Directive  Healthcare Power of Attorney   Pre-existing out of facility DNR order (yellow form or pink MOST form)  Yellow form placed in chart (order not valid for inpatient use)   "MOST" Form in Place?        TOTAL TIME TAKING CARE OF THIS PATIENT: 40 minutes.   Discussed  with Dr. Rance Muir M.D on 02/12/2015 at 12:10 PM  Between 7am to 6pm - Pager - [phone]  After 6pm go to [email or web address] - password [email or web address] [facility]  Office  [phone]  CC: Primary care physician; Henrine Screws, MD

## 2015-02-13 ENCOUNTER — Inpatient Hospital Stay

## 2015-02-13 DIAGNOSIS — J209 Acute bronchitis, unspecified: Secondary | ICD-10-CM

## 2015-02-13 DIAGNOSIS — J441 Chronic obstructive pulmonary disease with (acute) exacerbation: Secondary | ICD-10-CM

## 2015-02-13 DIAGNOSIS — D72829 Elevated white blood cell count, unspecified: Secondary | ICD-10-CM

## 2015-02-13 DIAGNOSIS — E871 Hypo-osmolality and hyponatremia: Secondary | ICD-10-CM

## 2015-02-13 LAB — BASIC METABOLIC PANEL
Anion gap: 14 (ref 5–15)
BUN: 89 mg/dL — ABNORMAL HIGH (ref 6–20)
CO2: 24 mmol/L (ref 22–32)
Calcium: 8.4 mg/dL — ABNORMAL LOW (ref 8.9–10.3)
Chloride: 89 mmol/L — ABNORMAL LOW (ref 101–111)
Creatinine, Ser: 3.71 mg/dL — ABNORMAL HIGH (ref 0.44–1.00)
GFR, EST AFRICAN AMERICAN: 12 mL/min — AB (ref >60)
GFR, EST NON AFRICAN AMERICAN: 11 mL/min — AB (ref >60)
Glucose, Bld: 207 mg/dL — ABNORMAL HIGH (ref 65–99)
Potassium: 4.6 mmol/L (ref 3.5–5.1)
SODIUM: 127 mmol/L — AB (ref 135–145)

## 2015-02-13 LAB — GLUCOSE, CAPILLARY
GLUCOSE-CAPILLARY: 187 mg/dL — AB (ref 65–99)
Glucose-Capillary: 223 mg/dL — ABNORMAL HIGH (ref 65–99)
Glucose-Capillary: 235 mg/dL — ABNORMAL HIGH (ref 65–99)

## 2015-02-13 LAB — CBC
HEMATOCRIT: 28.7 % — AB (ref 35.0–47.0)
HEMOGLOBIN: 9.4 g/dL — AB (ref 12.0–16.0)
MCH: 32.5 pg (ref 26.0–34.0)
MCHC: 32.8 g/dL (ref 32.0–36.0)
MCV: 99.1 fL (ref 80.0–100.0)
Platelets: 215 10*3/uL (ref 150–440)
RBC: 2.89 MIL/uL — AB (ref 3.80–5.20)
RDW: 14.9 % — AB (ref 11.5–14.5)
WBC: 9.2 10*3/uL (ref 3.6–11.0)

## 2015-02-13 LAB — URINE CULTURE: Culture: 1000

## 2015-02-13 MED ORDER — GUAIFENESIN 100 MG/5ML PO SYRP: 200.0000 mg | ORAL_SOLUTION | ORAL | Status: AC | PRN

## 2015-02-13 MED ORDER — LORATADINE 10 MG PO TABS
10.0000 mg | ORAL_TABLET | Freq: Every day | ORAL | Status: DC
  Administered 2015-02-13: 10 mg via ORAL
  Filled 2015-02-13: qty 1

## 2015-02-13 MED ORDER — ALLOPURINOL 100 MG PO TABS: 100.0000 mg | ORAL_TABLET | Freq: Every day | ORAL | Status: AC

## 2015-02-13 MED ORDER — LEVOFLOXACIN 250 MG PO TABS: 250.0000 mg | ORAL_TABLET | ORAL | Status: AC

## 2015-02-13 MED ORDER — IPRATROPIUM-ALBUTEROL 0.5-2.5 (3) MG/3ML IN SOLN: 3.0000 mL | Freq: Four times a day (QID) | RESPIRATORY_TRACT | Status: AC

## 2015-02-13 MED ORDER — CETYLPYRIDINIUM CHLORIDE 0.05 % MT LIQD: 7.0000 mL | Freq: Two times a day (BID) | OROMUCOSAL | Status: AC

## 2015-02-13 MED ORDER — LORATADINE 10 MG PO TABS: 10.0000 mg | ORAL_TABLET | Freq: Every day | ORAL | Status: AC

## 2015-02-13 MED ORDER — GABAPENTIN 100 MG PO CAPS
100.0000 mg | ORAL_CAPSULE | Freq: Every evening | ORAL | Status: DC | PRN
Start: 2015-02-13 — End: 2015-02-13

## 2015-02-13 MED ORDER — METHYLPREDNISOLONE 4 MG PO TBPK: ORAL_TABLET | ORAL | Status: AC

## 2015-02-13 MED ORDER — GABAPENTIN 100 MG PO CAPS: 100.0000 mg | ORAL_CAPSULE | Freq: Every evening | ORAL | Status: AC | PRN

## 2015-02-13 NOTE — Progress Notes (Signed)
Inpatient Diabetes Program Recommendations  AACE/ADA: New Consensus Statement on Inpatient Glycemic Control (2013)  Target Ranges:  Prepandial:   less than 140 mg/dL      Peak postprandial:   less than 180 mg/dL (1-2 hours)      Critically ill patients:  140 - 180 mg/dL   Results for Susan Mccoy, Susan Mccoy (MRN [id number]) as of 02/13/2015 12:36  Ref. Range 02/12/2015 11:18 02/12/2015 16:45 02/12/2015 21:04 02/13/2015 07:19 02/13/2015 11:21  Glucose-Capillary Latest Ref Range: 65-99 mg/dL 179 (H) 209 (H) 254 (H) 187 (H) 223 (H)    Reason for Visit: elevated CBG  Diabetes history: Type 2 Outpatient Diabetes medications: glipizide 20 mg qam Current orders for Inpatient glycemic control: Glucotrol 20mg  qam, Novolog correction 0-9 units tid   While patient is on steroids and in the hospital, please consider increasing the Novolog correction to the resistant scale, 0- 20 units tid- post prandial blood sugars remain elevated despite current dosing.   Gentry Fitz, RN, BA, MHA, CDE Diabetes Coordinator Inpatient Diabetes Program  [phone] (Team Pager) [phone] ([place]) 02/13/2015 12:39 PMm

## 2015-02-13 NOTE — Discharge Summary (Signed)
[facility] NAME: Susan Mccoy    MR#:  [id number]  DATE OF BIRTH:  Nov 08, 1931  DATE OF ADMISSION:  02/11/2015 ADMITTING PHYSICIAN: Idelle Crouch, MD  DATE OF DISCHARGE: 02/13/2015  PRIMARY CARE PHYSICIAN: Henrine Screws, MD     ADMISSION DIAGNOSIS:  Healthcare-associated pneumonia [J18.9]  DISCHARGE DIAGNOSIS:  Principal Problem:   Acute on chronic respiratory failure Active Problems:   Acute on chronic renal failure   Acute bronchitis   COPD exacerbation   Hyponatremia   Leukocytosis   SECONDARY DIAGNOSIS:   Past Medical History  Diagnosis Date  . Hypertension   . Thyroid disease   . Abnormal LFTs     Fatty liver by [place]  . Hypercholesterolemia   . Carpal tunnel syndrome   . DJD (degenerative joint disease)   . Shingles   . Meralgia paraesthetica   . GERD (gastroesophageal reflux disease)   . Pulmonary fibrosis   . Hemorrhoids   . Diverticulosis   . Normocytic normochromic anemia   . History of recurrent TIAs   . UTI (lower urinary tract infection)   . Chronic renal disease, stage 4, severely decreased glomerular filtration rate between 15-29 mL/min/1.73 square meter   . Anemia of chronic kidney failure   . Diabetes mellitus without complication     Type 2  . CHF (congestive heart failure)     pt not aware of this  . Bipolar disorder     in the past  . Constipation   . Macular degeneration   . Seizures     questionably had when she had TIA's, takes Primodone, states it has helped with the "shaking"  . Complication of anesthesia     trouble with putting her to sleep - 12/15 - pt doesn't remember this    .pro HOSPITAL COURSE:   Patient is a 81 year old Caucasian female with history of pulmonary fibrosis, chronic respiratory failure who presents to the hospital with fevers, cough, phlegm production and shortness of breath and progressive weakness. Her initial chest x-ray done on  02/11/2015 revealed severe chronic lung disease without definite acute overlying pulmonary findings. Patient was admitted to the hospital for further evaluation. She was admitted on levofloxacin for bronchitis and the steroids as well as inhalation therapy for COPD exacerbation. With this her condition stabilized, she improved but remained on oxygen at 2 L of oxygen through nasal cannula with satisfactory oxygen saturation levels . Patient's laboratory findings on admission revealed significant leukocytosis of 18,000, however, with therapy her white blood cell count normalized. The patient was seen by Dr. Raul Del,  pulmonologist who recommended conservative therapy. Patient was also noted to have acute on chronic renal failure, however, only conservative management was recommended by nephrologist to patient's overall declining condition. Discussion by problem 1. Acute on chronic respiratory failure due to acute bronchitis, COPD exacerbation, continue patient on Levaquin, steroid taper, inhalation therapy, stable. Continue oxygen therapy as needed, keeping her pulse oximeter at around 94%, now on 2 L of oxygen  2. Acute bronchitis, levofloxacin. Sputum cultures are requested, not yet obtained. However, patient's white blood cell count has improved 3. COPD exacerbation. Continue steroid taper as well as inhalation therapy via nebulizing machine in the facility as well  4. Acute on chronic renal failure, Resume diuretics for edema as needed, supportive therapy is recommended at this point. Discussed this patient's family, power of attorney, [name] 5. Hyponatremia, likely due to Lasix, although worse even without  diuretic, questionable SIADH. Patient may benefit from fluid restriction. Watching her volume status closely. Recommended to follow patient's sodium level closely as outpatient  6. Leukocytosis, improving with therapy   DISCHARGE CONDITIONS:   poor  CONSULTS OBTAINED:  Treatment Team:   Murlean Iba, MD Erby Pian, MD  DRUG ALLERGIES:   Allergies  Allergen Reactions  . Morphine And Related Other (See Comments)    Very sensitive to all pain meds-narcotic   . Penicillins Other (See Comments)    REACTION: GI Upset  . Sulfonamide Derivatives Other (See Comments)    Unknown. Symptoms were when she was a teenager  . Celebrex [Celecoxib] Rash  . Valium [Diazepam] Other (See Comments)    DISCHARGE MEDICATIONS:   Current Discharge Medication List    START taking these medications   Details  antiseptic oral rinse (CPC / CETYLPYRIDINIUM CHLORIDE 0.05%) 0.05 % LIQD solution 7 mLs by Mouth Rinse route 2 times daily at 12 noon and 4 pm. Qty: 100 mL, Refills: 0    gabapentin (NEURONTIN) 100 MG capsule Take 1 capsule (100 mg total) by mouth at bedtime as needed (neuropathic pain). Qty: 30 capsule, Refills: 0    guaifenesin (ROBITUSSIN) 100 MG/5ML syrup Take 10 mLs (200 mg total) by mouth every 4 (four) hours as needed for congestion. Qty: 120 mL, Refills: 0    ipratropium-albuterol (DUONEB) 0.5-2.5 (3) MG/3ML SOLN Take 3 mLs by nebulization 4 (four) times daily. Qty: 360 mL, Refills: 0    levofloxacin (LEVAQUIN) 250 MG tablet Take 1 tablet (250 mg total) by mouth every other day. Qty: 7 tablet, Refills: 0    loratadine (CLARITIN) 10 MG tablet Take 1 tablet (10 mg total) by mouth daily. Qty: 30 tablet, Refills: 0    methylPREDNISolone (MEDROL DOSEPAK) 4 MG TBPK tablet follow package directions Qty: 21 tablet, Refills: 0      CONTINUE these medications which have CHANGED   Details  allopurinol (ZYLOPRIM) 100 MG tablet Take 1 tablet (100 mg total) by mouth daily. Qty: 30 tablet, Refills: 0      CONTINUE these medications which have NOT CHANGED   Details  acetaminophen (TYLENOL) 325 MG tablet Take 650 mg by mouth every 4 (four) hours.    amLODipine (NORVASC) 5 MG tablet Take 5 mg by mouth daily.    aspirin EC 81 MG tablet Take 81 mg by mouth daily.     atenolol (TENORMIN) 25 MG tablet Take 12.5 mg by mouth daily.     calcitRIOL (ROCALTROL) 0.25 MCG capsule Take 0.25 mcg by mouth 2 (two) times a week. Take on Monday and Thursday.    cholecalciferol (VITAMIN D) 1000 UNITS tablet Take 1,000 Units by mouth daily.    cyanocobalamin (,VITAMIN B-12,) 1000 MCG/ML injection Inject 1,000 mcg into the muscle every 30 (thirty) days.    cyclobenzaprine (FLEXERIL) 10 MG tablet Take 5 mg by mouth at bedtime.    dextromethorphan (DELSYM) 30 MG/5ML liquid Take 60 mg by mouth every 12 (twelve) hours.    ferrous sulfate 325 (65 FE) MG tablet Take 325 mg by mouth daily with breakfast.    glipiZIDE (GLUCOTROL XL) 10 MG 24 hr tablet Take 20 mg by mouth every morning.    hydrOXYzine (ATARAX/VISTARIL) 25 MG tablet Take 25 mg by mouth 3 (three) times daily as needed for itching (itching).     lansoprazole (PREVACID) 30 MG capsule Take 30 mg by mouth daily. Before a meal once a day    Multiple Vitamins-Minerals (I-VITE) TABS  Take 1 tablet by mouth daily.    primidone (MYSOLINE) 50 MG tablet Take 50 mg by mouth 3 (three) times daily.     torsemide (DEMADEX) 20 MG tablet Take 20 mg by mouth daily.    sertraline (ZOLOFT) 50 MG tablet Take 1 tablet (50 mg total) by mouth daily. Qty: 30 tablet, Refills: 11         DISCHARGE INSTRUCTIONS:    Follow-up with her primary care physician, Dr. Josetta Huddle in the next few days after discharge. Patient will be followed by hospice at home  If you experience worsening of your admission symptoms, develop shortness of breath, life threatening emergency, suicidal or homicidal thoughts you must seek medical attention immediately by calling 911 or calling your MD immediately  if symptoms less severe.  You Must read complete instructions/literature along with all the possible adverse reactions/side effects for all the Medicines you take and that have been prescribed to you. Take any new Medicines after you have  completely understood and accept all the possible adverse reactions/side effects.   Please note  You were cared for by a hospitalist during your hospital stay. If you have any questions about your discharge medications or the care you received while you were in the hospital after you are discharged, you can call the unit and asked to speak with the hospitalist on call if the hospitalist that took care of you is not available. Once you are discharged, your primary care physician will handle any further medical issues. Please note that NO REFILLS for any discharge medications will be authorized once you are discharged, as it is imperative that you return to your primary care physician (or establish a relationship with a primary care physician if you do not have one) for your aftercare needs so that they can reassess your need for medications and monitor your lab values.    Today   CHIEF COMPLAINT:   Chief Complaint  Patient presents with  . Fever  . Weakness  . Cough    HISTORY OF PRESENT ILLNESS:  Susan Mccoy  is a 81 y.o. female with a known history of pulmonary fibrosis, chronic respiratory failure who presents to the hospital with fevers, cough, phlegm production and shortness of breath and progressive weakness. Her initial chest x-ray done on 02/11/2015 revealed severe chronic lung disease without definite acute overlying pulmonary findings. Patient was admitted to the hospital for further evaluation. She was admitted on levofloxacin for bronchitis and the steroids as well as inhalation therapy for COPD exacerbation. With this her condition stabilized, she improved but remained on oxygen at 2 L of oxygen through nasal cannula with satisfactory oxygen saturation levels . Patient's laboratory findings on admission revealed significant leukocytosis of 18,000, however, with therapy her white blood cell count normalized. The patient was seen by Dr. Raul Del,  pulmonologist who recommended  conservative therapy. Patient was also noted to have acute on chronic renal failure, however, only conservative management was recommended by nephrologist to patient's overall declining condition. Discussion by problem 1. Acute on chronic respiratory failure due to acute bronchitis, COPD exacerbation, continue patient on Levaquin, steroid taper, inhalation therapy, stable. Continue oxygen therapy as needed, keeping her pulse oximeter at around 94%, now on 2 L of oxygen  2. Acute bronchitis, levofloxacin. Sputum cultures are requested, not yet obtained. However, patient's white blood cell count has improved 3. COPD exacerbation. Continue steroid taper as well as inhalation therapy via nebulizing machine in the facility as well  4. Acute  on chronic renal failure, Resume diuretics for edema as needed, supportive therapy is recommended at this point. Discussed this patient's family, power of attorney, [name] 5. Hyponatremia, likely due to Lasix, although worse even without diuretic, questionable SIADH. Patient may benefit from fluid restriction. Watching her volume status closely. Recommended to follow patient's sodium level closely as outpatient  6. Leukocytosis, improving with therapy    VITAL SIGNS:  Blood pressure 144/54, pulse 80, temperature 98.4 F (36.9 C), temperature source Oral, resp. rate 20, height 5\' 2"  (1.575 m), weight 59.421 kg (131 lb), SpO2 93 %.  I/O:   Intake/Output Summary (Last 24 hours) at 02/13/15 1405 Last data filed at 02/13/15 1307  Gross per 24 hour  Intake      0 ml  Output    675 ml  Net   -675 ml    PHYSICAL EXAMINATION:  GENERAL:  81 y.o.-year-old patient lying in the bed with no acute distress. Pale and has bluish discoloration of her lips looks weak and chronically ill EYES: Pupils equal, round, reactive to light and accommodation. No scleral icterus. Extraocular muscles intact.  HEENT: Head atraumatic, normocephalic. Oropharynx and nasopharynx  clear.  NECK:  Supple, no jugular venous distention. No thyroid enlargement, no tenderness.  LUNGS: Somewhat diminished breath sounds bilaterally, no wheezing, rales,rhonchi , with bilateral diffuse crepitations noted on auscultation. No use of accessory muscles of respiration. Poor inspiratory effort overall except that when she coughs CARDIOVASCULAR: S1, S2 normal. No murmurs, rubs, or gallops.  ABDOMEN: Soft, non-tender, non-distended. Bowel sounds present. No organomegaly or mass.  EXTREMITIES: Trace to none pedal edema, cyanosis, or clubbing.  NEUROLOGIC: Cranial nerves II through XII are intact. Muscle strength 5/5 in all extremities. Sensation intact. Gait not checked.  PSYCHIATRIC: The patient is alert and oriented x 3.  SKIN: No obvious rash, lesion, or ulcer.   DATA REVIEW:   CBC  Recent Labs Lab 02/13/15 0442  WBC 9.2  HGB 9.4*  HCT 28.7*  PLT 215    Chemistries   Recent Labs Lab 02/11/15 1259  02/13/15 0442  NA 132*  < > 127*  K 4.4  < > 4.6  CL 97*  < > 89*  CO2 24  < > 24  GLUCOSE 113*  < > 207*  BUN 75*  < > 89*  CREATININE 3.71*  < > 3.71*  CALCIUM 8.3*  < > 8.4*  AST 22  --   --   ALT 22  --   --   ALKPHOS 75  --   --   BILITOT 0.3  --   --   < > = values in this interval not displayed.  Cardiac Enzymes No results for input(s): TROPONINI in the last 168 hours.  Microbiology Results  Results for orders placed or performed during the hospital encounter of 02/11/15  Culture, blood (routine x 2)     Status: None (Preliminary result)   Collection Time: 02/11/15 12:59 PM  Result Value Ref Range Status   Specimen Description BLOOD RIGHT FATTY CASTS  Final   Special Requests BOTTLES DRAWN AEROBIC AND ANAEROBIC  2CC  Final   Culture NO GROWTH 2 DAYS  Final   Report Status PENDING  Incomplete  Culture, blood (routine x 2)     Status: None (Preliminary result)   Collection Time: 02/11/15  1:02 PM  Result Value Ref Range Status   Specimen Description  BLOOD RIGHT ASSIST CONTROL  Final   Special Requests BOTTLES DRAWN AEROBIC  AND ANAEROBIC  2CC  Final   Culture NO GROWTH 2 DAYS  Final   Report Status PENDING  Incomplete  Urine culture     Status: None   Collection Time: 02/11/15  3:40 PM  Result Value Ref Range Status   Specimen Description URINE, CLEAN CATCH  Final   Special Requests NONE  Final   Culture 1,000 COLONIES/mL INSIGNIFICANT GROWTH  Final   Report Status 02/13/2015 FINAL  Final    RADIOLOGY:  Dg Elbow 2 Views Right  02/12/2015   CLINICAL DATA:  81 year old female with right-sided arm pain since morning.  EXAM: RIGHT ELBOW - 2 VIEW  COMPARISON:  No priors.  FINDINGS: Multiple views of the right elbow demonstrate no acute displaced fracture, subluxation, dislocation, or soft tissue abnormality.  IMPRESSION: No acute radiographic abnormality of the right elbow.   Electronically Signed   By: Vinnie Langton M.D.   On: 02/12/2015 22:24   Dg Forearm Right  02/12/2015   CLINICAL DATA:  81 year old female with fever and dehydration. Shortness of breath. Right-sided arm pain since this morning.  EXAM: RIGHT FOREARM - 2 VIEW  COMPARISON:  No priors.  FINDINGS: Multiple views of the right forearm demonstrate no acute displaced fracture, subluxation, dislocation, or soft tissue abnormality.  IMPRESSION: No acute radiographic abnormality of the right radius or ulna.   Electronically Signed   By: Vinnie Langton M.D.   On: 02/12/2015 22:23   US Renal  02/13/2015   CLINICAL DATA:  Acute renal failure.  EXAM: RENAL / URINARY TRACT ULTRASOUND COMPLETE  COMPARISON:  05/01/2009.  FINDINGS: Right Kidney:  Length: 7.5 cm. Increased echogenicity consistent chronic medical renal disease. No mass or hydronephrosis visualized.  Left Kidney:  Length: 8.5 cm. Increased echogenicity consistent chronic medical renal disease. No mass or hydronephrosis visualized.  Bladder:  Mild bladder distention.  IMPRESSION: Kidneys are echogenic consistent chronic renal  disease. Bilateral renal atrophy is present. Mild bladder distention. No hydronephrosis.   Electronically Signed   By: Marcello Moores  Register   On: 02/13/2015 10:07   Dg Humerus Right  02/12/2015   CLINICAL DATA:  81 year old female with right arm pain since this morning.  EXAM: RIGHT HUMERUS - 2+ VIEW  COMPARISON:  No priors.  FINDINGS: Multiple views of the right humerus demonstrate no acute displaced fracture, subluxation, dislocation, or soft tissue abnormality.  IMPRESSION: No acute radiographic abnormality of the right humerus.   Electronically Signed   By: Vinnie Langton M.D.   On: 02/12/2015 22:25    EKG:   Orders placed or performed during the hospital encounter of 08/14/14  . EKG 12-Lead  . EKG 12-Lead  . EKG      Management plans discussed with the patient, family and they are in agreement.  CODE STATUS:     Code Status Orders        Start     Ordered   02/11/15 1715  Do not attempt resuscitation (DNR)   Continuous    Question Answer Comment  In the event of cardiac or respiratory ARREST Do not call a "code blue"   In the event of cardiac or respiratory ARREST Do not perform Intubation, CPR, defibrillation or ACLS   In the event of cardiac or respiratory ARREST Use medication by any route, position, wound care, and other measures to relive pain and suffering. May use oxygen, suction and manual treatment of airway obstruction as needed for comfort.      02/11/15 1714    Advance  Directive Documentation        Most Recent Value   Type of Advance Directive  Healthcare Power of Attorney   Pre-existing out of facility DNR order (yellow form or pink MOST form)  Yellow form placed in chart (order not valid for inpatient use)   "MOST" Form in Place?        TOTAL TIME TAKING CARE OF THIS PATIENT: 60 minutes.  Prolonged discussion with patient's daughter, [name] over the phone regards to patient's medical condition as well as treatment plan and discharge planning. All  questions answered. She voiced understanding and agreement. Time spent approximately 10 minutes before discussion  Emeka Lindner M.D on 02/13/2015 at 2:05 PM  Between 7am to 6pm - Pager - [phone]  After 6pm go to [email or web address] - password [email or web address] [facility]  Office  [phone]  CC: Primary care physician; Henrine Screws, MD

## 2015-02-13 NOTE — Progress Notes (Signed)
PT Cancellation Note  Patient Details Name: Susan Mccoy MRN: [id number] DOB: April 03, 1932   Cancelled Treatment:    Reason Eval/Treat Not Completed: Patient at procedure or test/unavailable (Treatment attempted; patient currently with respiratory for breathing treatment.  Will re-attempt at later time/date if patient remains in facility.)   Hutch Rhett H. Owens Shark, PT, DPT, NCS 02/13/2015, 4:59 PM [phone]

## 2015-02-13 NOTE — Care Management Important Message (Signed)
Important Message  Patient Details  Name: Susan Mccoy MRN: [id number] Date of Birth: August 11, 1931   Medicare Important Message Given:  Yes-second notification given    Juliann Pulse A Allmond 02/13/2015, 10:18 AM

## 2015-02-13 NOTE — Clinical Social Work Note (Signed)
CSW notified RN, Santiago Glad with Hospice, pt's daughter [phone]  and facility that pt would DC today back to [facility] via EMS.  CSW signing off.

## 2015-02-13 NOTE — Progress Notes (Signed)
Visit made. Patient seen sitting up in bed, eating breakfast. She reports she is missing her lower dentures, relates that she has a habit of placing them in kleenex and leaving them in her "slacks pocket". She thinks they maybe at home. Room checked with staff RN Velna Hatchet, no dentures found. Upper dentures in place.  Patient reports her right fore arm "feels much better' today. Per chart review she did continue to have pain in her right fore arm yesterday after Liaison visit, she did receive pain medication which was ineffective.,xray ordered and was negative for any acute abnormalities. She did receive a one time dose of gabapentin 300 mg which per chart review was effective. Writer spoke with attending physician Dr. Ether Griffins regarding possible discharge today, writer also requested that attending contact patient's daughter Juliann Pulse to give her an update, staff RN Velna Hatchet also made aware. WBC continue to trend down with current IV abt. Will continue to follow and update team.  CSW Caryl Pina made aware of  need for transport at discharge. Portable DNR form in place in patient's chart.  Thank you. Flo Shanks RN, BSN, [facility] [phone] c [facility] Liaison

## 2015-02-13 NOTE — Progress Notes (Signed)
Date: 02/13/2015,   MRN# [id number] Susan Mccoy 12-15-1931 Code Status:     Code Status Orders        Start     Ordered   02/11/15 1715  Do not attempt resuscitation (DNR)   Continuous    Question Answer Comment  In the event of cardiac or respiratory ARREST Do not call a "code blue"   In the event of cardiac or respiratory ARREST Do not perform Intubation, CPR, defibrillation or ACLS   In the event of cardiac or respiratory ARREST Use medication by any route, position, wound care, and other measures to relive pain and suffering. May use oxygen, suction and manual treatment of airway obstruction as needed for comfort.      02/11/15 1714    Advance Directive Documentation        Most Recent Value   Type of Advance Directive  Healthcare Power of Attorney   Pre-existing out of facility DNR order (yellow form or pink MOST form)  Yellow form placed in chart (order not valid for inpatient use)   "MOST" Form in Place?       Hosp day:@LENGTHOFSTAYDAYS @ Referring MD: @ATDPROV @      HPI: Sitting up in bed, on 2 liters [place] 02, less sob, wbc coming down, no fever or chills.  PMHX:   Past Medical History  Diagnosis Date  . Hypertension   . Thyroid disease   . Abnormal LFTs     Fatty liver by [place]  . Hypercholesterolemia   . Carpal tunnel syndrome   . DJD (degenerative joint disease)   . Shingles   . Meralgia paraesthetica   . GERD (gastroesophageal reflux disease)   . Pulmonary fibrosis   . Hemorrhoids   . Diverticulosis   . Normocytic normochromic anemia   . History of recurrent TIAs   . UTI (lower urinary tract infection)   . Chronic renal disease, stage 4, severely decreased glomerular filtration rate between 15-29 mL/min/1.73 square meter   . Anemia of chronic kidney failure   . Diabetes mellitus without complication     Type 2  . CHF (congestive heart failure)     pt not aware of this  . Bipolar disorder     in the past  . Constipation   . Macular degeneration   .  Seizures     questionably had when she had TIA's, takes Primodone, states it has helped with the "shaking"  . Complication of anesthesia     trouble with putting her to sleep - 12/15 - pt doesn't remember this   Surgical Hx:  Past Surgical History  Procedure Laterality Date  . Back surgery      X 2 - lumbar  . Tonsillectomy    . Cataract extraction, bilateral    . Breast biopsy  B/L- said to be negative for malignancy.  . Colonoscopy    . Colonoscopy N/A 08/08/2013    Procedure: COLONOSCOPY;  Surgeon: [name], MD;  Location: [facility];  Service: Endoscopy;  Laterality: N/A;  EGD is possible  . Carpal tunnel release Left   . Eye surgery Bilateral     cataract surgery with lens implant  . Bascilic vein transposition Left 08/03/2014    Procedure:  INSERTION of Left Arm Gortex graft;  Surgeon: Angelia Mould, MD;  Location: [facility];  Service: Vascular;  Laterality: Left;  . Esophagogastroduodenoscopy (egd) with propofol N/A 08/16/2014    Procedure: ESOPHAGOGASTRODUODENOSCOPY (EGD) WITH PROPOFOL;  Surgeon: Ronald Lobo  V, MD;  Location: [facility];  Service: Endoscopy;  Laterality: N/A;   Family Hx:  Family History  Problem Relation Age of Onset  . Emphysema Father     smoker  . Diabetes Father   . Varicose Veins Father   . Heart disease Father     before age 10  . Heart attack Father   . Emphysema Brother     smoker  . Cancer Brother   . Varicose Veins Mother   . Cancer Sister   . Diabetes Sister   . Cancer Sister   . Cancer Brother    Social Hx:   History  Substance Use Topics  . Smoking status: Former Smoker -- 2.00 packs/day for 40 years    Quit date: 08/05/1991  . Smokeless tobacco: Never Used  . Alcohol Use: No   Medication:    Home Medication:  Current Outpatient Rx  Name  Route  Sig  Dispense  Refill  . allopurinol (ZYLOPRIM) 100 MG tablet   Oral   Take 1 tablet (100 mg total) by mouth daily.   30 tablet   0   . antiseptic oral rinse  (CPC / CETYLPYRIDINIUM CHLORIDE 0.05%) 0.05 % LIQD solution   Mouth Rinse   7 mLs by Mouth Rinse route 2 times daily at 12 noon and 4 pm.   100 mL   0   . gabapentin (NEURONTIN) 100 MG capsule   Oral   Take 1 capsule (100 mg total) by mouth at bedtime as needed (neuropathic pain).   30 capsule   0   . guaifenesin (ROBITUSSIN) 100 MG/5ML syrup   Oral   Take 10 mLs (200 mg total) by mouth every 4 (four) hours as needed for congestion.   120 mL   0   . ipratropium-albuterol (DUONEB) 0.5-2.5 (3) MG/3ML SOLN   Nebulization   Take 3 mLs by nebulization 4 (four) times daily.   360 mL   0   . levofloxacin (LEVAQUIN) 250 MG tablet   Oral   Take 1 tablet (250 mg total) by mouth every other day.   7 tablet   0   . loratadine (CLARITIN) 10 MG tablet   Oral   Take 1 tablet (10 mg total) by mouth daily.   30 tablet   0   . methylPREDNISolone (MEDROL DOSEPAK) 4 MG TBPK tablet      follow package directions   21 tablet   0     Current Medication: @CURMEDTAB @   Allergies:  Morphine and related; Penicillins; Sulfonamide derivatives; Celebrex; and Valium  Review of Systems: Gen:  Denies  fever, sweats, chills HEENT: Denies blurred vision, double vision, ear pain, eye pain, hearing loss, nose bleeds, sore throat Cvc:  No dizziness, chest pain or heaviness Resp:  Breathing is getting better  Gi: Denies swallowing difficulty, stomach pain, nausea or vomiting, diarrhea, constipation, bowel incontinence Gu:  Denies bladder incontinence, burning urine Ext:   No Joint pain, stiffness or swelling Skin: No skin rash, easy bruising or bleeding or hives Endoc:  No polyuria, polydipsia , polyphagia or weight change Psych: No depression, insomnia or hallucinations  Other:  All other systems negative  Physical Examination:   VS: BP 144/54 mmHg  Pulse 80  Temp(Src) 98.4 F (36.9 C) (Oral)  Resp 20  Ht 5\' 2"  (1.575 m)  Wt 131 lb (59.421 kg)  BMI 23.95 kg/m2  SpO2 93%  General  Appearance: No distress  Neuro/psych: without focal findings, mental status,  speech normal, alert and oriented, cranial nerves 2-12 intact, reflexes normal and symmetric, sensation grossly normal  HEENT: PERRLA, EOM intact, no ptosis, no other lesions noticed Neck: Soft, no jvd or stridor  Pulmonary:.No wheezing, No rales, distant crackles    Cardiovascular:  Normal S1,S2.  No m/r/g.      Abdomen:Benign, Soft, non-tender, No masses, hepatosplenomegaly, No lymphadenopathy Endoc: No evident thyromegaly, no signs of acromegaly or Cushing features Skin:   warm, no rashes, no ecchymosis  Extremities: normal, no cyanosis, clubbing, no edema, warm with normal capillary refill. Other findings:   Labs results:   Recent Labs     02/11/15  1259  02/12/15  0549  02/13/15  0442  HGB  9.5*  9.1*  9.4*  HCT  29.6*  27.9*  28.7*  MCV  99.8  99.7  99.1  WBC  18.0*  14.3*  9.2  BUN  75*  77*  89*  CREATININE  3.71*  3.60*  3.71*  GLUCOSE  113*  137*  207*  CALCIUM  8.3*  8.1*  8.4*  ,    Rad results:   Dg Elbow 2 Views Right  02/12/2015   CLINICAL DATA:  81 year old female with right-sided arm pain since morning.  EXAM: RIGHT ELBOW - 2 VIEW  COMPARISON:  No priors.  FINDINGS: Multiple views of the right elbow demonstrate no acute displaced fracture, subluxation, dislocation, or soft tissue abnormality.  IMPRESSION: No acute radiographic abnormality of the right elbow.   Electronically Signed   By: Vinnie Langton M.D.   On: 02/12/2015 22:24   Dg Forearm Right  02/12/2015   CLINICAL DATA:  81 year old female with fever and dehydration. Shortness of breath. Right-sided arm pain since this morning.  EXAM: RIGHT FOREARM - 2 VIEW  COMPARISON:  No priors.  FINDINGS: Multiple views of the right forearm demonstrate no acute displaced fracture, subluxation, dislocation, or soft tissue abnormality.  IMPRESSION: No acute radiographic abnormality of the right radius or ulna.   Electronically Signed   By:  Vinnie Langton M.D.   On: 02/12/2015 22:23   US Renal  02/13/2015   CLINICAL DATA:  Acute renal failure.  EXAM: RENAL / URINARY TRACT ULTRASOUND COMPLETE  COMPARISON:  05/01/2009.  FINDINGS: Right Kidney:  Length: 7.5 cm. Increased echogenicity consistent chronic medical renal disease. No mass or hydronephrosis visualized.  Left Kidney:  Length: 8.5 cm. Increased echogenicity consistent chronic medical renal disease. No mass or hydronephrosis visualized.  Bladder:  Mild bladder distention.  IMPRESSION: Kidneys are echogenic consistent chronic renal disease. Bilateral renal atrophy is present. Mild bladder distention. No hydronephrosis.   Electronically Signed   By: Marcello Moores  Register   On: 02/13/2015 10:07   Dg Humerus Right  02/12/2015   CLINICAL DATA:  81 year old female with right arm pain since this morning.  EXAM: RIGHT HUMERUS - 2+ VIEW  COMPARISON:  No priors.  FINDINGS: Multiple views of the right humerus demonstrate no acute displaced fracture, subluxation, dislocation, or soft tissue abnormality.  IMPRESSION: No acute radiographic abnormality of the right humerus.   Electronically Signed   By: Vinnie Langton M.D.   On: 02/12/2015 22:25       Assessment and Plan: Here with dyspnea, due combination copd, uip, as a result hypoxia, she did fever earlier with leukocytosis ? Early underlying infection ( pneumonia, gu etc). DNR, dementia  -continue present level of care -hold on ofev -wean 02 as tolerated -repeat cxr in am (with hydration, if present pneumonia will blossom) -  following     I have personally obtained a history, examined the patient, evaluated laboratory and imaging results, formulated the assessment and plan and placed orders.  The Patient requires high complexity decision making for assessment and support, frequent evaluation and titration of therapies, application of advanced monitoring technologies and extensive interpretation of multiple databases.   Herbon  Fleming,M.D. Pulmonary & Critical care Medicine [facility]

## 2015-02-13 NOTE — Progress Notes (Signed)
[place] [facility] [place] NAME: Susan Mccoy    MR#:  [id number]  DATE OF BIRTH:  27-Jun-1932  SUBJECTIVE:  CHIEF COMPLAINT:   Chief Complaint  Patient presents with  . Fever  . Weakness  . Cough    The patient presented with cough, sputum production, fever and weakness. Chest x-ray did not show pneumonia. Patient is admitted for COPD exacerbation, acute on chronic respiratory failure. She was also noted to have renal failure, acute on chronic. She feels weak today but tells me that her cough is subsiding. She is afebrile and remains on levofloxacin.     Review of Systems  Constitutional: Negative for fever, chills and weight loss.  HENT: Negative for congestion.   Eyes: Negative for blurred vision and double vision.  Respiratory: Positive for cough, sputum production and shortness of breath. Negative for wheezing.   Cardiovascular: Negative for chest pain, palpitations, orthopnea, leg swelling and PND.  Gastrointestinal: Negative for nausea, vomiting, abdominal pain, diarrhea, constipation and blood in stool.  Genitourinary: Negative for dysuria, urgency, frequency and hematuria.  Musculoskeletal: Negative for falls.  Neurological: Negative for dizziness, tremors, focal weakness and headaches.  Endo/Heme/Allergies: Does not bruise/bleed easily.  Psychiatric/Behavioral: Negative for depression. The patient does not have insomnia.     VITAL SIGNS: Blood pressure 144/54, pulse 80, temperature 98.4 F (36.9 C), temperature source Oral, resp. rate 20, height 5\' 2"  (1.575 m), weight 59.421 kg (131 lb), SpO2 93 %.  PHYSICAL EXAMINATION:   GENERAL:  81 y.o.-year-old patient lying in the bed with no acute distress. Weak and has blue discoloration of her lips, somewhat slumped in the bed  EYES: Pupils equal, round, reactive to light and accommodation. No scleral icterus. Extraocular muscles intact.  HEENT: Head atraumatic, normocephalic.  Oropharynx and nasopharynx clear.  NECK:  Supple, no jugular venous distention. No thyroid enlargement, no tenderness.  LUNGS: Diminished breath sounds bilaterally, no wheezing, rales,rhonchi , but bilateral crepitations were noted. Diffusely . No use of accessory muscles of respiration.  CARDIOVASCULAR: S1, S2 normal. No murmurs, rubs, or gallops.  ABDOMEN: Soft, nontender, nondistended. Bowel sounds present. No organomegaly or mass.  EXTREMITIES: No pedal edema, cyanosis, or clubbing.  NEUROLOGIC: Cranial nerves II through XII are intact. Muscle strength 5/5 in all extremities. Sensation intact. Gait not checked.  PSYCHIATRIC: The patient is alert and oriented x 2.  SKIN: No obvious rash, lesion, or ulcer.   ORDERS/RESULTS REVIEWED:   CBC  Recent Labs Lab 02/11/15 1259 02/12/15 0549 02/13/15 0442  WBC 18.0* 14.3* 9.2  HGB 9.5* 9.1* 9.4*  HCT 29.6* 27.9* 28.7*  PLT 199 188 215  MCV 99.8 99.7 99.1  MCH 32.0 32.3 32.5  MCHC 32.1 32.4 32.8  RDW 15.6* 15.2* 14.9*  LYMPHSABS 1.7  --   --   MONOABS 1.8*  --   --   EOSABS 0.2  --   --   BASOSABS 0.1  --   --    ------------------------------------------------------------------------------------------------------------------  Chemistries   Recent Labs Lab 02/11/15 1259 02/12/15 0549 02/13/15 0442  NA 132* 130* 127*  K 4.4 4.3 4.6  CL 97* 95* 89*  CO2 24 24 24   GLUCOSE 113* 137* 207*  BUN 75* 77* 89*  CREATININE 3.71* 3.60* 3.71*  CALCIUM 8.3* 8.1* 8.4*  AST 22  --   --   ALT 22  --   --   ALKPHOS 75  --   --   BILITOT 0.3  --   --    ------------------------------------------------------------------------------------------------------------------  estimated creatinine clearance is 9.4 mL/min (by C-G formula based on Cr of 3.71). ------------------------------------------------------------------------------------------------------------------ No results for input(s): TSH, T4TOTAL, T3FREE, THYROIDAB in the last 72  hours.  Invalid input(s): FREET3  Cardiac Enzymes No results for input(s): CKMB, TROPONINI, MYOGLOBIN in the last 168 hours.  Invalid input(s): CK ------------------------------------------------------------------------------------------------------------------ Invalid input(s): POCBNP ---------------------------------------------------------------------------------------------------------------  RADIOLOGY: Dg Elbow 2 Views Right  02/12/2015   CLINICAL DATA:  81 year old female with right-sided arm pain since morning.  EXAM: RIGHT ELBOW - 2 VIEW  COMPARISON:  No priors.  FINDINGS: Multiple views of the right elbow demonstrate no acute displaced fracture, subluxation, dislocation, or soft tissue abnormality.  IMPRESSION: No acute radiographic abnormality of the right elbow.   Electronically Signed   By: Vinnie Langton M.D.   On: 02/12/2015 22:24   Dg Forearm Right  02/12/2015   CLINICAL DATA:  81 year old female with fever and dehydration. Shortness of breath. Right-sided arm pain since this morning.  EXAM: RIGHT FOREARM - 2 VIEW  COMPARISON:  No priors.  FINDINGS: Multiple views of the right forearm demonstrate no acute displaced fracture, subluxation, dislocation, or soft tissue abnormality.  IMPRESSION: No acute radiographic abnormality of the right radius or ulna.   Electronically Signed   By: Vinnie Langton M.D.   On: 02/12/2015 22:23   US Renal  02/13/2015   CLINICAL DATA:  Acute renal failure.  EXAM: RENAL / URINARY TRACT ULTRASOUND COMPLETE  COMPARISON:  05/01/2009.  FINDINGS: Right Kidney:  Length: 7.5 cm. Increased echogenicity consistent chronic medical renal disease. No mass or hydronephrosis visualized.  Left Kidney:  Length: 8.5 cm. Increased echogenicity consistent chronic medical renal disease. No mass or hydronephrosis visualized.  Bladder:  Mild bladder distention.  IMPRESSION: Kidneys are echogenic consistent chronic renal disease. Bilateral renal atrophy is present. Mild  bladder distention. No hydronephrosis.   Electronically Signed   By: Marcello Moores  Register   On: 02/13/2015 10:07   Dg Humerus Right  02/12/2015   CLINICAL DATA:  81 year old female with right arm pain since this morning.  EXAM: RIGHT HUMERUS - 2+ VIEW  COMPARISON:  No priors.  FINDINGS: Multiple views of the right humerus demonstrate no acute displaced fracture, subluxation, dislocation, or soft tissue abnormality.  IMPRESSION: No acute radiographic abnormality of the right humerus.   Electronically Signed   By: Vinnie Langton M.D.   On: 02/12/2015 22:25    EKG:  Orders placed or performed during the hospital encounter of 08/14/14  . EKG 12-Lead  . EKG 12-Lead  . EKG    ASSESSMENT AND PLAN:  Principal Problem:   Acute respiratory failure Active Problems:   Acute on chronic renal failure   Acute on chronic respiratory failure 1. Acute on chronic respiratory failure due to acute bronchitis, COPD exacerbation, continue patient on Levaquin, steroid taper, inhalation therapy, stable. Continue oxygen therapy as needed, keeping her pulse oximeter at around 94%, now on 2 L of oxygen  2. Acute bronchitis,  levofloxacin. Sputum cultures are requested 3. COPD exacerbation. Continue steroids as well as inhalation therapy in the facility as well  4. Acute on chronic renal failure, Resume diuretics for edema as needed, supportive therapy is recommended at this point. Discussed this patient's family, power of attorney, [name] 5. Hyponatremia, likely due to  Lasix, although worse without diuretic, possibly SIADH. Patient may benefit from fluid restriction. Watching her volume status closely  6. Leukocytosis, improving with therapy  Management plans discussed with the patient, family and they are in agreement.  DRUG ALLERGIES:  Allergies  Allergen Reactions  . Morphine And Related Other (See Comments)    Very sensitive to all pain meds-narcotic   . Penicillins Other (See Comments)    REACTION:  GI Upset  . Sulfonamide Derivatives Other (See Comments)    Unknown. Symptoms were when she was a teenager  . Celebrex [Celecoxib] Rash  . Valium [Diazepam] Other (See Comments)    CODE STATUS:     Code Status Orders        Start     Ordered   02/11/15 1715  Do not attempt resuscitation (DNR)   Continuous    Question Answer Comment  In the event of cardiac or respiratory ARREST Do not call a "code blue"   In the event of cardiac or respiratory ARREST Do not perform Intubation, CPR, defibrillation or ACLS   In the event of cardiac or respiratory ARREST Use medication by any route, position, wound care, and other measures to relive pain and suffering. May use oxygen, suction and manual treatment of airway obstruction as needed for comfort.      02/11/15 1714    Advance Directive Documentation        Most Recent Value   Type of Advance Directive  Healthcare Power of Attorney   Pre-existing out of facility DNR order (yellow form or pink MOST form)  Yellow form placed in chart (order not valid for inpatient use)   "MOST" Form in Place?        TOTAL TIME TAKING CARE OF THIS PATIENT: 40 minutes.   Discussed with Dr. Rance Muir M.D on 02/13/2015 at 1:41 PM  Between 7am to 6pm - Pager - [phone]  After 6pm go to [email or web address] - password [email or web address] [facility]  Office  [phone]  CC: Primary care physician; Henrine Screws, MD

## 2015-02-13 NOTE — Progress Notes (Signed)
Pt reported her arm pain was much improved this morning.  Pt states she slept well after receiving the gabapentin.  Will continue to monitor.

## 2015-02-13 NOTE — Plan of Care (Signed)
Problem: Discharge Progression Outcomes Goal: Other Discharge Outcomes/Goals Outcome: Progressing Pt is alert but confused, generalized weakness, up to bsc with heavy assist, good appetite, denies pain, [place] on kidneys performed, wbu count normalized, sodium serum remains low at 127, pt is d/c to home place, report given to Alyssa, pt will be transferred via EMS, remains a DNR.

## 2015-02-16 LAB — CULTURE, BLOOD (ROUTINE X 2)
CULTURE: NO GROWTH
Culture: NO GROWTH

## 2015-02-26 ENCOUNTER — Ambulatory Visit: Payer: BLUE CROSS/BLUE SHIELD

## 2015-04-05 DEATH — deceased

## 2015-05-31 NOTE — Telephone Encounter (Signed)
Error

## 2015-11-01 IMAGING — RF DG ESOPHAGUS
12 of 17 series · 14 of 20 positions shown · non-contrast
Comparison: None

CLINICAL DATA: Distal esophageal dysphagia for solids and pills

EXAM:
ESOPHOGRAM/BARIUM SWALLOW
TECHNIQUE: Single contrast examination was performed using thin barium. 12.5 mm
diameter barium tablet was also given.
FLUOROSCOPY TIME:  2 min 30 seconds

[Series 1: cp_standard · 0.23mm/px · 2 of 3 slices shown (1 of 12)]
[im 1/3]
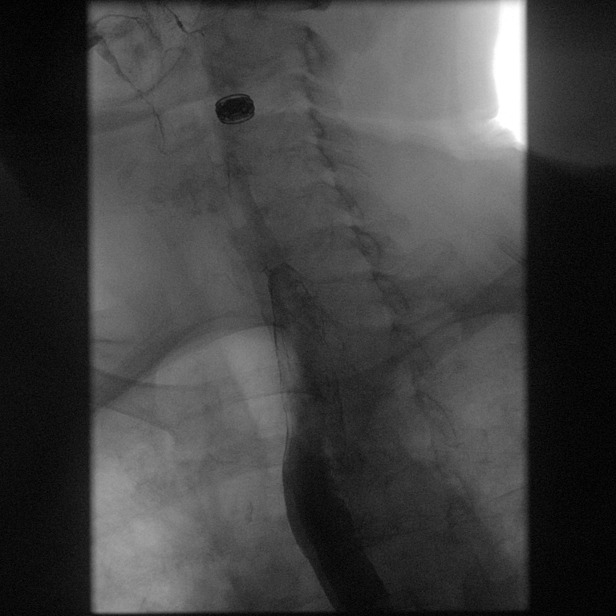
[im 3/3]
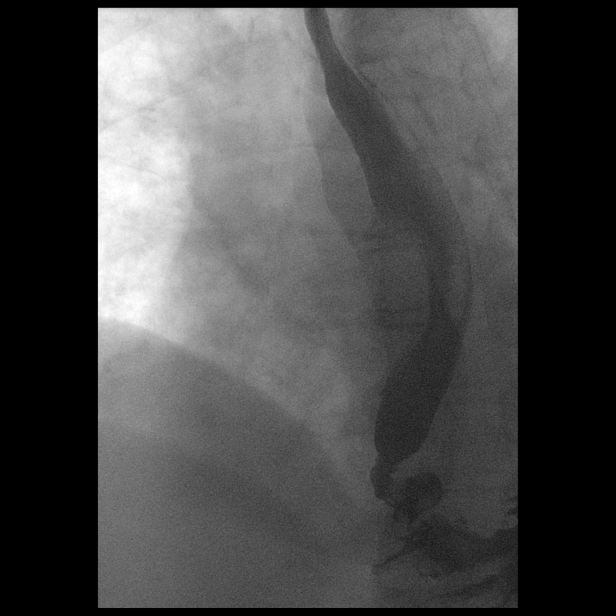

[Series 4: cp_standard · 0.22mm/px · 1 of 1 slices shown (2 of 12)]
[im 1/1]
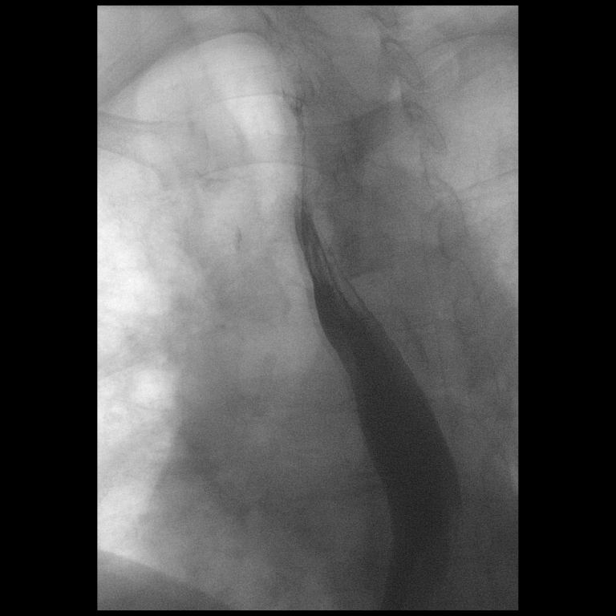

[Series 6: cp_standard · 0.22mm/px · 1 of 1 slices shown (3 of 12)]
[im 1/1]
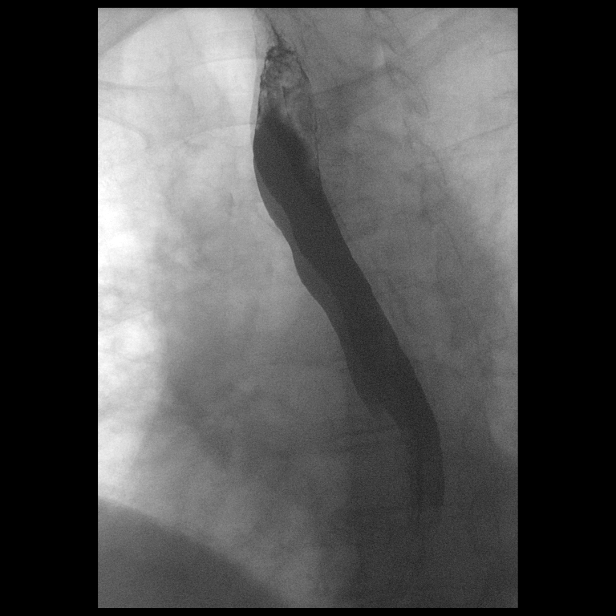

[Series 7: cp_standard · 0.22mm/px · 1 of 1 slices shown (4 of 12)]
[im 1/1]
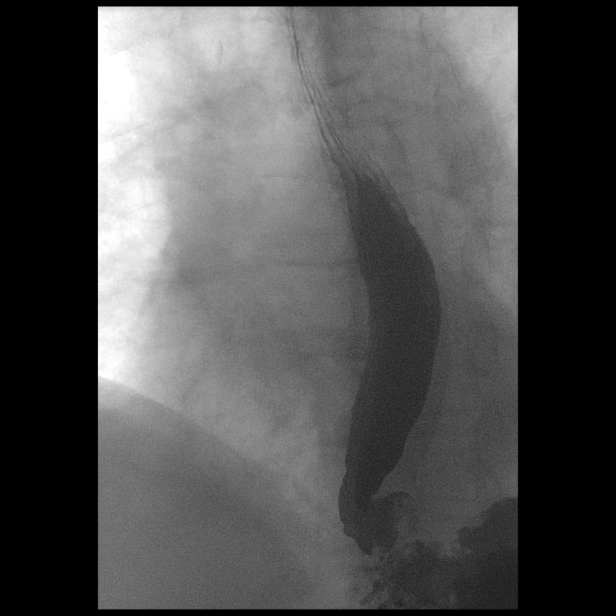

[Series 8: cp_standard · 0.22mm/px · 1 of 1 slices shown (5 of 12)]
[im 1/1]
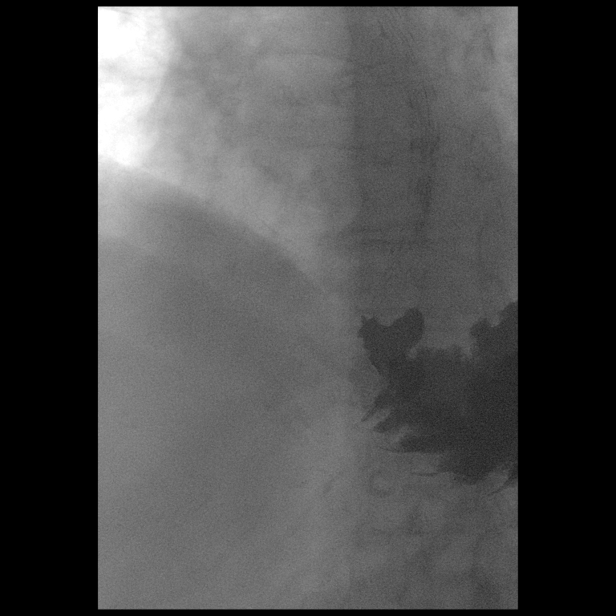

[Series 10: cp_standard · 0.20mm/px · 2 of 2 slices shown (6 of 12)]
[im 1/2]
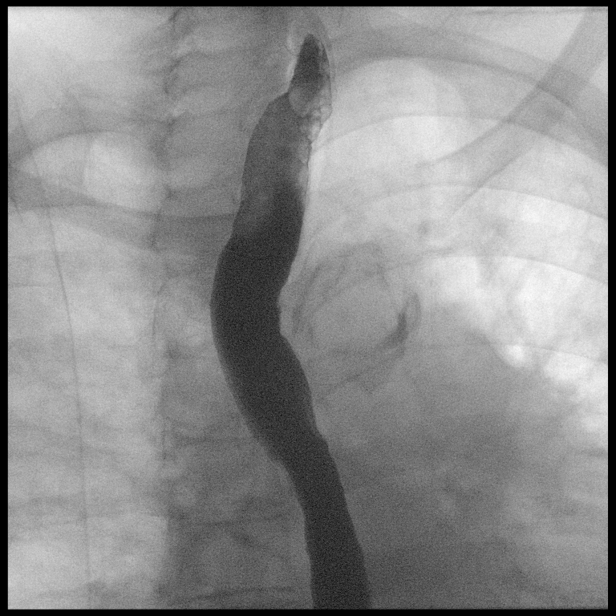
[im 2/2]
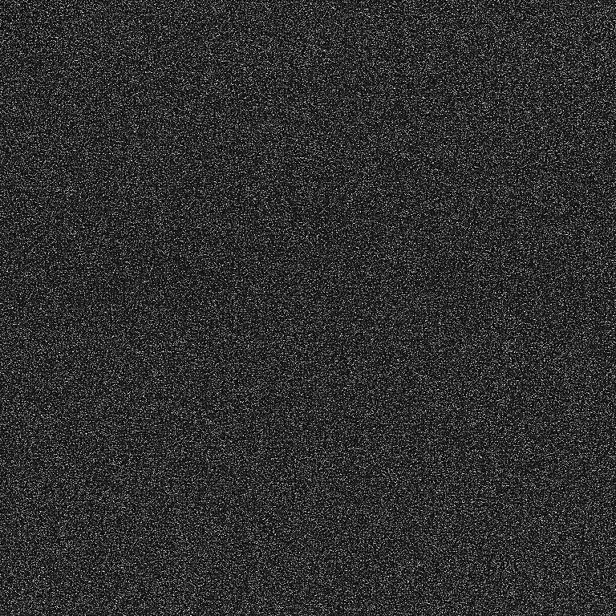

[Series 13: cp_standard · 0.20mm/px · 1 of 1 slices shown (7 of 12)]
[im 1/1]
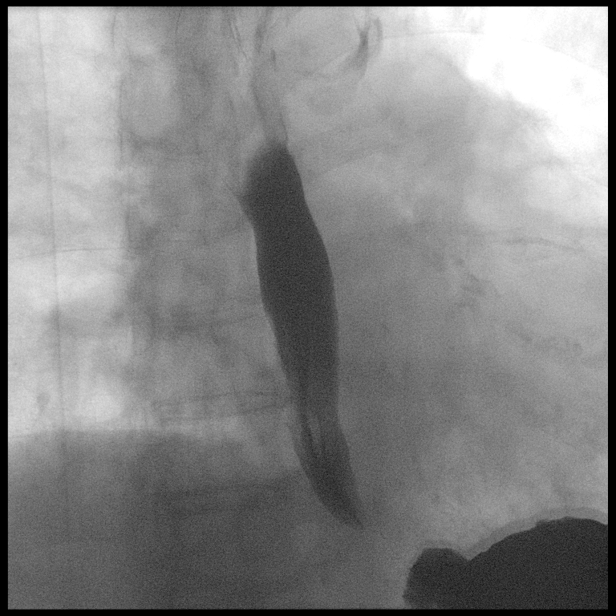

[Series 14: cp_standard · 0.20mm/px · 1 of 1 slices shown (8 of 12)]
[im 1/1]
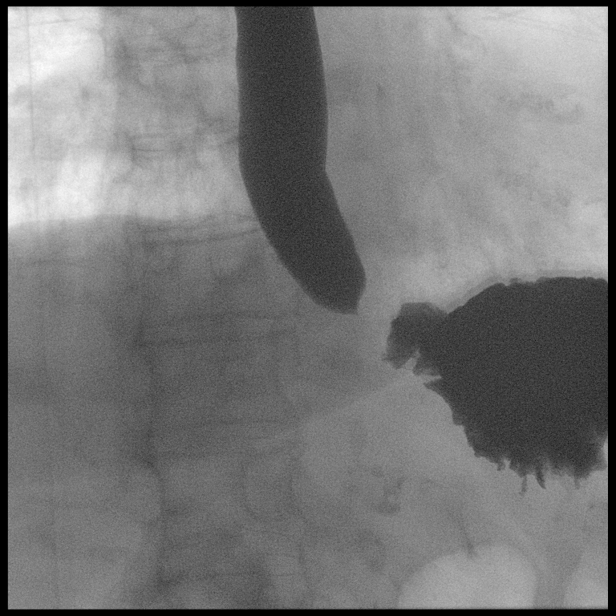

[Series 16: cp_standard · 0.20mm/px · 1 of 1 slices shown (9 of 12)]
[im 1/1]
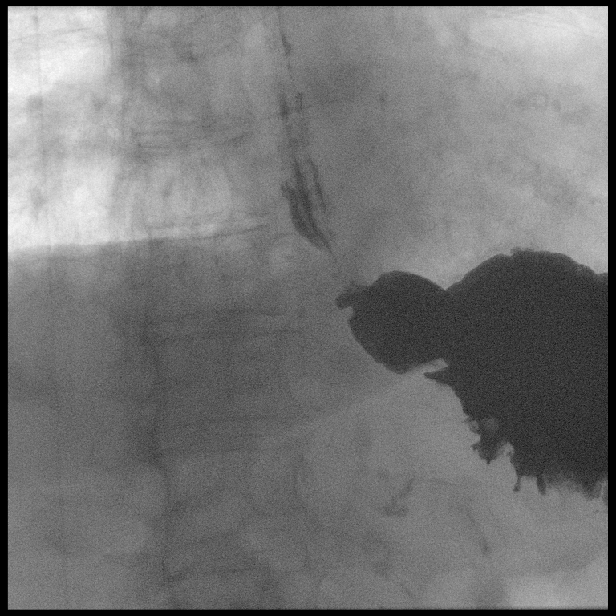

[Series 17: cp_standard · 0.20mm/px · 1 of 1 slices shown (10 of 12)]
[im 1/1]
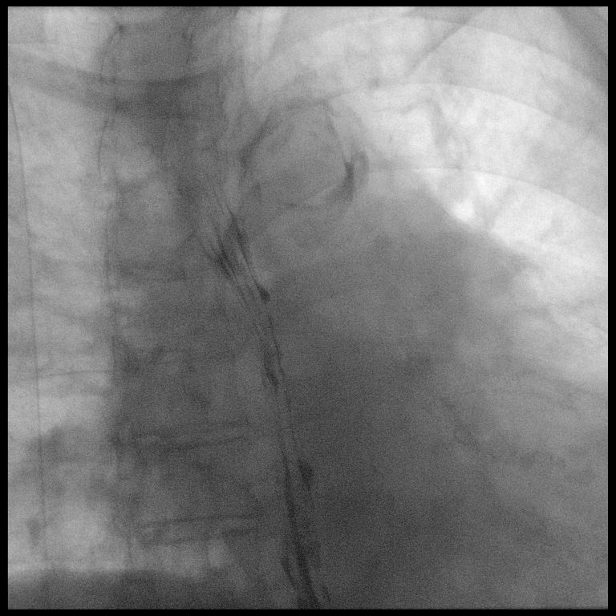

[Series 18: cp_standard · 0.20mm/px · 1 of 1 slices shown (11 of 12)]
[im 1/1]
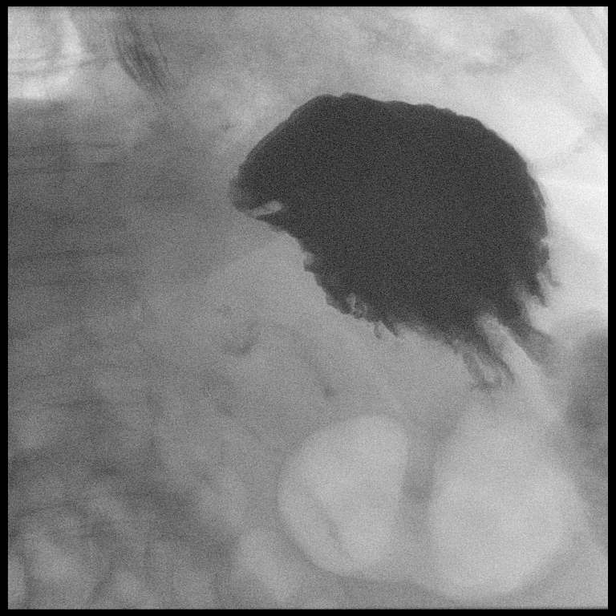

[Series 20: cp_standard · 0.20mm/px · 1 of 1 slices shown (12 of 12)]
[im 1/1]
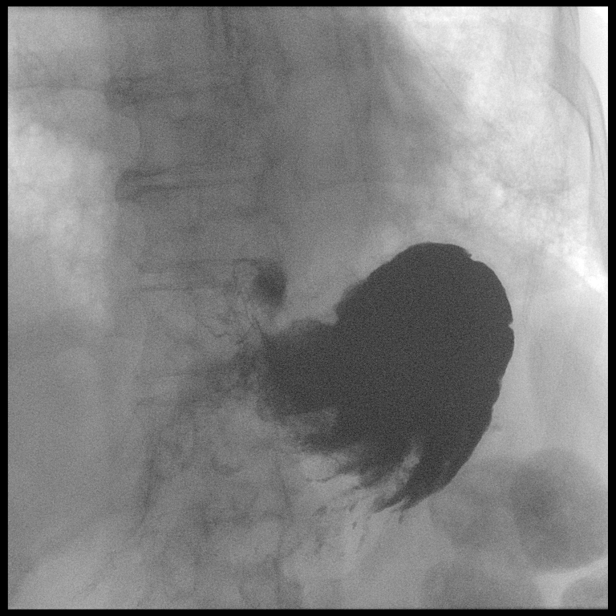

[14 of 20 positions shown; findings below may reference images not displayed]

FINDINGS: Significant diffuse impairment of esophageal motility.

No gross evidence of esophageal mass or stricture.

Small outpouching of contrast was identified at the distal
esophagus.

Initially I believed this to be a small epiphrenic diverticulum.

However, review of the entirety of the exam shows this is not
persistent and likely represents a small sliding hiatal hernia with
intermittent retention of contrast within the hernia sac above the
diaphragm.

12.5 mm diameter barium tablet transiently delayed within this but
then passed beyond following multiple swallows of thin barium and
water.

No definite persistent intraluminal filling defects.

As examination was performed horizontal LPO/RPO and patient has no
cervical esophageal symptoms, dedicated assessment of oropharyngeal
phase of swallowing was not performed.

Atherosclerotic calcifications of the aorta noted.

Bones appear demineralized.
IMPRESSION: Small sliding hiatal hernia with transient delay of the 12.5 mm
diameter barium tablet within the hernia sac above the diaphragm,
initially passing into the stomach with additional swallows of thin
barium and water.

Significant diffuse age-related esophageal dysmotility.

No evidence of esophageal mass or stricture.
